# Patient Record
Sex: Male | Born: 1955 | Hispanic: Yes | Marital: Married | Smoking: Former smoker
Health system: Southern US, Community
[De-identification: ages and names within clinical notes are randomized; demographics above are authoritative.]

## PROBLEM LIST (undated history)

## (undated) DIAGNOSIS — Z87442 Personal history of urinary calculi: Secondary | ICD-10-CM

## (undated) DIAGNOSIS — I10 Essential (primary) hypertension: Secondary | ICD-10-CM

## (undated) DIAGNOSIS — G8929 Other chronic pain: Secondary | ICD-10-CM

## (undated) DIAGNOSIS — I714 Abdominal aortic aneurysm, without rupture, unspecified: Secondary | ICD-10-CM

## (undated) DIAGNOSIS — N179 Acute kidney failure, unspecified: Secondary | ICD-10-CM

## (undated) DIAGNOSIS — E119 Type 2 diabetes mellitus without complications: Secondary | ICD-10-CM

## (undated) DIAGNOSIS — I471 Supraventricular tachycardia: Secondary | ICD-10-CM

## (undated) HISTORY — PX: ABDOMINAL SURGERY: SHX537

## (undated) HISTORY — DX: Abdominal aortic aneurysm, without rupture, unspecified: I71.40

## (undated) HISTORY — PX: SHOULDER SURGERY: SHX246

---

## 2002-10-09 ENCOUNTER — Emergency Department (HOSPITAL_COMMUNITY): Admission: EM | Admit: 2002-10-09 | Discharge: 2002-10-10 | Payer: Self-pay | Admitting: Emergency Medicine

## 2002-10-09 ENCOUNTER — Encounter: Payer: Self-pay | Admitting: Emergency Medicine

## 2003-06-20 ENCOUNTER — Encounter: Payer: Self-pay | Admitting: Family Medicine

## 2003-06-20 ENCOUNTER — Ambulatory Visit (HOSPITAL_COMMUNITY): Admission: RE | Admit: 2003-06-20 | Discharge: 2003-06-20 | Payer: Self-pay | Admitting: Family Medicine

## 2003-08-05 ENCOUNTER — Ambulatory Visit (HOSPITAL_COMMUNITY): Admission: RE | Admit: 2003-08-05 | Discharge: 2003-08-05 | Payer: Self-pay | Admitting: Orthopaedic Surgery

## 2003-08-27 ENCOUNTER — Emergency Department (HOSPITAL_COMMUNITY): Admission: EM | Admit: 2003-08-27 | Discharge: 2003-08-27 | Payer: Self-pay | Admitting: Internal Medicine

## 2003-09-05 ENCOUNTER — Emergency Department (HOSPITAL_COMMUNITY): Admission: EM | Admit: 2003-09-05 | Discharge: 2003-09-05 | Payer: Self-pay | Admitting: Emergency Medicine

## 2017-12-14 ENCOUNTER — Emergency Department (HOSPITAL_COMMUNITY): Payer: Self-pay

## 2017-12-14 ENCOUNTER — Inpatient Hospital Stay (HOSPITAL_COMMUNITY)
Admission: EM | Admit: 2017-12-14 | Discharge: 2017-12-15 | DRG: 309 | Disposition: A | Discharge status: Home / Self Care | Payer: Self-pay | Attending: Family Medicine | Admitting: Family Medicine

## 2017-12-14 ENCOUNTER — Other Ambulatory Visit: Payer: Self-pay

## 2017-12-14 ENCOUNTER — Encounter (HOSPITAL_COMMUNITY): Payer: Self-pay | Admitting: *Deleted

## 2017-12-14 DIAGNOSIS — I513 Intracardiac thrombosis, not elsewhere classified: Secondary | ICD-10-CM | POA: Diagnosis present

## 2017-12-14 DIAGNOSIS — I471 Supraventricular tachycardia, unspecified: Secondary | ICD-10-CM

## 2017-12-14 DIAGNOSIS — I10 Essential (primary) hypertension: Secondary | ICD-10-CM

## 2017-12-14 DIAGNOSIS — N200 Calculus of kidney: Secondary | ICD-10-CM | POA: Diagnosis present

## 2017-12-14 DIAGNOSIS — I714 Abdominal aortic aneurysm, without rupture, unspecified: Secondary | ICD-10-CM | POA: Diagnosis present

## 2017-12-14 DIAGNOSIS — I214 Non-ST elevation (NSTEMI) myocardial infarction: Secondary | ICD-10-CM

## 2017-12-14 DIAGNOSIS — N179 Acute kidney failure, unspecified: Secondary | ICD-10-CM

## 2017-12-14 DIAGNOSIS — R935 Abnormal findings on diagnostic imaging of other abdominal regions, including retroperitoneum: Secondary | ICD-10-CM

## 2017-12-14 DIAGNOSIS — E876 Hypokalemia: Secondary | ICD-10-CM | POA: Diagnosis present

## 2017-12-14 DIAGNOSIS — R748 Abnormal levels of other serum enzymes: Secondary | ICD-10-CM

## 2017-12-14 DIAGNOSIS — I248 Other forms of acute ischemic heart disease: Secondary | ICD-10-CM | POA: Diagnosis present

## 2017-12-14 DIAGNOSIS — R109 Unspecified abdominal pain: Secondary | ICD-10-CM

## 2017-12-14 DIAGNOSIS — R7989 Other specified abnormal findings of blood chemistry: Secondary | ICD-10-CM

## 2017-12-14 DIAGNOSIS — R778 Other specified abnormalities of plasma proteins: Secondary | ICD-10-CM

## 2017-12-14 HISTORY — DX: Essential (primary) hypertension: I10

## 2017-12-14 HISTORY — DX: Acute kidney failure, unspecified: N17.9

## 2017-12-14 HISTORY — DX: Supraventricular tachycardia: I47.1

## 2017-12-14 HISTORY — DX: Supraventricular tachycardia, unspecified: I47.10

## 2017-12-14 LAB — I-STAT CHEM 8, ED
BUN: 26 mg/dL — ABNORMAL HIGH (ref 6–20)
CALCIUM ION: 1.18 mmol/L (ref 1.15–1.40)
CHLORIDE: 108 mmol/L (ref 101–111)
Creatinine, Ser: 2 mg/dL — ABNORMAL HIGH (ref 0.61–1.24)
GLUCOSE: 163 mg/dL — AB (ref 65–99)
HCT: 50 % (ref 39.0–52.0)
Hemoglobin: 17 g/dL (ref 13.0–17.0)
Potassium: 3.4 mmol/L — ABNORMAL LOW (ref 3.5–5.1)
Sodium: 145 mmol/L (ref 135–145)
TCO2: 21 mmol/L — AB (ref 22–32)

## 2017-12-14 LAB — CBC WITH DIFFERENTIAL/PLATELET
Basophils Absolute: 0 10*3/uL (ref 0.0–0.1)
Basophils Relative: 0 %
EOS PCT: 0 %
Eosinophils Absolute: 0 10*3/uL (ref 0.0–0.7)
HCT: 50.6 % (ref 39.0–52.0)
Hemoglobin: 17.3 g/dL — ABNORMAL HIGH (ref 13.0–17.0)
LYMPHS ABS: 2 10*3/uL (ref 0.7–4.0)
LYMPHS PCT: 16 %
MCH: 32.6 pg (ref 26.0–34.0)
MCHC: 34.2 g/dL (ref 30.0–36.0)
MCV: 95.3 fL (ref 78.0–100.0)
MONO ABS: 1.1 10*3/uL — AB (ref 0.1–1.0)
MONOS PCT: 8 %
Neutro Abs: 9.8 10*3/uL — ABNORMAL HIGH (ref 1.7–7.7)
Neutrophils Relative %: 76 %
PLATELETS: 268 10*3/uL (ref 150–400)
RBC: 5.31 MIL/uL (ref 4.22–5.81)
RDW: 14.4 % (ref 11.5–15.5)
WBC: 12.9 10*3/uL — AB (ref 4.0–10.5)

## 2017-12-14 LAB — I-STAT TROPONIN, ED: TROPONIN I, POC: 0.13 ng/mL — AB (ref 0.00–0.08)

## 2017-12-14 LAB — SODIUM, URINE, RANDOM: Sodium, Ur: 155 mmol/L

## 2017-12-14 LAB — TROPONIN I
Troponin I: 0.47 ng/mL (ref <0.03)
Troponin I: 1.68 ng/mL (ref <0.03)

## 2017-12-14 LAB — CREATININE, URINE, RANDOM: Creatinine, Urine: 187.83 mg/dL

## 2017-12-14 MED ORDER — DOCUSATE SODIUM 100 MG PO CAPS
100.0000 mg | ORAL_CAPSULE | Freq: Two times a day (BID) | ORAL | Status: DC
  Administered 2017-12-14 – 2017-12-15 (×2): 100 mg via ORAL
  Filled 2017-12-14 (×2): qty 1

## 2017-12-14 MED ORDER — ONDANSETRON HCL 4 MG/2ML IJ SOLN: 4.0000 mg | Freq: Four times a day (QID) | INTRAMUSCULAR | Status: DC | PRN

## 2017-12-14 MED ORDER — ADENOSINE 6 MG/2ML IV SOLN
INTRAVENOUS | Status: AC
  Filled 2017-12-14: qty 6

## 2017-12-14 MED ORDER — ACETAMINOPHEN 650 MG RE SUPP: 650.0000 mg | Freq: Four times a day (QID) | RECTAL | Status: DC | PRN

## 2017-12-14 MED ORDER — ACETAMINOPHEN 325 MG PO TABS: 650.0000 mg | ORAL_TABLET | Freq: Four times a day (QID) | ORAL | Status: DC | PRN

## 2017-12-14 MED ORDER — METOPROLOL TARTRATE 25 MG PO TABS
25.0000 mg | ORAL_TABLET | Freq: Two times a day (BID) | ORAL | Status: DC
  Administered 2017-12-14: 25 mg via ORAL
  Filled 2017-12-14: qty 1

## 2017-12-14 MED ORDER — ASPIRIN 325 MG PO TABS
325.0000 mg | ORAL_TABLET | Freq: Once | ORAL | Status: AC
  Administered 2017-12-14: 325 mg via ORAL
  Filled 2017-12-14: qty 1

## 2017-12-14 MED ORDER — SENNA 8.6 MG PO TABS
1.0000 | ORAL_TABLET | Freq: Two times a day (BID) | ORAL | Status: DC
  Administered 2017-12-14 – 2017-12-15 (×2): 8.6 mg via ORAL
  Filled 2017-12-14 (×2): qty 1

## 2017-12-14 MED ORDER — MORPHINE SULFATE (PF) 2 MG/ML IV SOLN
2.0000 mg | Freq: Once | INTRAVENOUS | Status: AC
  Administered 2017-12-14: 2 mg via INTRAVENOUS
  Filled 2017-12-14: qty 1

## 2017-12-14 MED ORDER — ENOXAPARIN SODIUM 40 MG/0.4ML ~~LOC~~ SOLN
40.0000 mg | SUBCUTANEOUS | Status: DC
  Administered 2017-12-14: 40 mg via SUBCUTANEOUS
  Filled 2017-12-14: qty 0.4

## 2017-12-14 MED ORDER — ADENOSINE 6 MG/2ML IV SOLN
INTRAVENOUS | Status: AC | PRN
  Administered 2017-12-14: 12 mg via INTRAVENOUS
  Administered 2017-12-14: 6 mg via INTRAVENOUS

## 2017-12-14 MED ORDER — ONDANSETRON HCL 4 MG PO TABS: 4.0000 mg | ORAL_TABLET | Freq: Four times a day (QID) | ORAL | Status: DC | PRN

## 2017-12-14 MED ORDER — POLYETHYLENE GLYCOL 3350 17 G PO PACK: 17.0000 g | PACK | Freq: Every day | ORAL | Status: DC | PRN

## 2017-12-14 NOTE — Progress Notes (Signed)
CRITICAL VALUE ALERT  Critical Value:  Trop 0.47  Date & Time Notied:  12/14/2017 1745  Provider Notified: Clearnce SorrelPurohit, MD  Orders Received/Actions taken: no actions received at this time

## 2017-12-14 NOTE — ED Provider Notes (Signed)
[facility] EMERGENCY DEPARTMENT Provider Note   CSN: [id number] Arrival date & time: 12/14/17  1343     History   Chief Complaint Chief Complaint  Patient presents with  . Chest Pain    HPI Jason Sweeney is a 62 y.o. male.  Patient states that he started with some chest pain around 630 this morning and it got more severe around 130.  The history is provided by the patient. No language interpreter was used.  Chest Pain   This is a new problem. The current episode started 3 to 5 hours ago. The problem occurs constantly. The problem has been rapidly worsening. The pain is associated with exertion. The pain is present in the substernal region. The pain is at a severity of 7/10. The pain is moderate. The quality of the pain is described as dull. The pain does not radiate. The symptoms are aggravated by exertion. Pertinent negatives include no abdominal pain, no back pain, no cough and no headaches.  Pertinent negatives for past medical history include no seizures.    No past medical history on file.  There are no active problems to display for this patient.         EKG Interpretation  Date/Time:    Ventricular Rate:    PR Interval:    QRS Duration:   QT Interval:    QTC Calculation:   R Axis:     Text Interpretation:           Home Medications    Prior to Admission medications   Medication Sig Start Date End Date Taking? Authorizing Provider  ibuprofen (ADVIL,MOTRIN) 200 MG tablet Take 400 mg by mouth every 6 (six) hours as needed.   Yes [provider]  lisinopril-hydrochlorothiazide (PRINZIDE,ZESTORETIC) 20-25 MG tablet Take 1 tablet by mouth daily. 12/04/17  Yes [provider]    Family History No family history on file.  Social History Social History   Tobacco Use  . Smoking status: Never Smoker  . Smokeless tobacco: Never Used  Substance Use Topics  . Alcohol use: Yes    Comment: occasionally  . Drug use: Not Currently      Allergies   Patient has no known allergies.   Review of Systems Review of Systems  Constitutional: Negative for appetite change and fatigue.  HENT: Negative for congestion, ear discharge and sinus pressure.   Eyes: Negative for discharge.  Respiratory: Negative for cough.   Cardiovascular: Positive for chest pain.  Gastrointestinal: Negative for abdominal pain and diarrhea.  Genitourinary: Negative for frequency and hematuria.  Musculoskeletal: Negative for back pain.  Skin: Negative for rash.  Neurological: Negative for seizures and headaches.  Psychiatric/Behavioral: Negative for hallucinations.     Physical Exam Updated Vital Signs BP 132/87 (BP Location: Right Arm)   Pulse 89   Temp 98.1 F (36.7 C) (Oral)   Resp 13   Ht 5\' 5"  (1.651 m)   Wt 99.8 kg (220 lb)   SpO2 99%   BMI 36.61 kg/m   Physical Exam  Constitutional: He is oriented to person, place, and time. He appears well-developed.  HENT:  Head: Normocephalic.  Eyes: Conjunctivae and EOM are normal. No scleral icterus.  Neck: Neck supple. No thyromegaly present.  Cardiovascular: Exam reveals no gallop and no friction rub.  No murmur heard. Rapid regular rate at about 180  Pulmonary/Chest: No stridor. He has no wheezes. He has no rales. He exhibits no tenderness.  Abdominal: He exhibits no distension. There is  no tenderness. There is no rebound.  Musculoskeletal: Normal range of motion. He exhibits no edema.  Lymphadenopathy:    He has no cervical adenopathy.  Neurological: He is oriented to person, place, and time. He exhibits normal muscle tone. Coordination normal.  Skin: No rash noted. No erythema.  Psychiatric: He has a normal mood and affect. His behavior is normal.     ED Treatments / Results  Labs (all labs ordered are listed, but only abnormal results are displayed) Labs Reviewed  CBC WITH DIFFERENTIAL/PLATELET - Abnormal; Notable for the following components:      Result Value   WBC  12.9 (*)    Hemoglobin 17.3 (*)    Neutro Abs 9.8 (*)    Monocytes Absolute 1.1 (*)    All other components within normal limits  I-STAT CHEM 8, ED - Abnormal; Notable for the following components:   Potassium 3.4 (*)    BUN 26 (*)    Creatinine, Ser 2.00 (*)    Glucose, Bld 163 (*)    TCO2 21 (*)    All other components within normal limits  I-STAT TROPONIN, ED - Abnormal; Notable for the following components:   Troponin i, poc 0.13 (*)    All other components within normal limits    EKG None  Radiology Dg Chest Portable 1 View  Result Date: 12/14/2017 CLINICAL DATA:  62 year old male with chest pain, back pain, dizziness, diaphoresis and chills. EXAM: PORTABLE CHEST 1 VIEW COMPARISON:  Report of chest radiograph 10/09/2002 (no images available). FINDINGS: Portable AP upright view at 1432 hours. Pacer or resuscitation pads projects over the left chest. Mediastinal contours are within normal limits. Visualized tracheal air column is within normal limits. No pneumothorax, pulmonary edema, pleural effusion or confluent pulmonary opacity. Paucity of bowel gas in the upper abdomen. Degenerative changes at the right [facility] joint. IMPRESSION: Negative.  No acute cardiopulmonary abnormality. Electronically Signed   By: Odessa Fleming M.D.   On: 12/14/2017 14:42    Procedures Procedures (including critical care time)  Medications Ordered in ED Medications  adenosine (ADENOCARD) 6 MG/2ML injection (12 mg Intravenous Given 12/14/17 1401)  morphine 2 MG/ML injection 2 mg (2 mg Intravenous Given 12/14/17 1413)  aspirin tablet 325 mg (325 mg Oral Given 12/14/17 1505)  CRITICAL CARE Performed by: Bethann Berkshire Total critical care time: 40 minutes Critical care time was exclusive of separately billable procedures and treating other patients. Critical care was necessary to treat or prevent imminent or life-threatening deterioration. Critical care was time spent personally by me on the following activities:  development of treatment plan with patient and/or surrogate as well as nursing, discussions with consultants, evaluation of patient's response to treatment, examination of patient, obtaining history from patient or surrogate, ordering and performing treatments and interventions, ordering and review of laboratory studies, ordering and review of radiographic studies, pulse oximetry and re-evaluation of patient's condition.  Patient was in a SVT at a rate of 180.  He was initially given adenosine 6 mg and did not convert.  He was then given adenosine 12 mg and did convert into normal sinus rhythm.  Patient had a elevated troponin 0.13.  Cardiology was consulted and felt the patient could be treated here at [facility].  He will be admitted to medicine with cardiology following  Initial Impression / Assessment and Plan / ED Course  I have reviewed the triage vital signs and the nursing notes.  Pertinent labs & imaging results that were available during  my care of the patient were reviewed by me and considered in my medical decision making (see chart for details).       Final Clinical Impressions(s) / ED Diagnoses   Final diagnoses:  NSTEMI (non-ST elevated myocardial infarction) [facility](HCC)    ED Discharge Orders    None       Bethann BerkshireZammit, Brayson Livesey, MD 12/14/17 1529

## 2017-12-14 NOTE — Consult Note (Signed)
Cardiology Consultation:   Patient ID: Jason Sweeney; [id number]; 11/05/1955   Admit date: 12/14/2017 Date of Consult: 12/14/2017  Primary Care Provider: Patient, No Pcp Per Primary Cardiologist: New (Dr. Purvis Sheffield)    Patient Profile:   Jason Sweeney is a 62 y.o. male with a hx of HTN who is being seen today for the evaluation of SVT and troponin elevation at the request of Dr. Estell Harpin.  History of Present Illness:   Jason Sweeney is a 62 year old male with a past medical history of hypertension.  He began experiencing pain in the throat and lower neck region this morning at about 630.  He experienced rapid palpitations.  He said this typically occurs about 3 times per year but after several minutes it stops on its own.  This morning it did not. He denies exertional chest pain and exertional dyspnea.  He denies having experienced chest pain this morning.  He also denies shortness of breath.  He did experience dizziness from 630 onwards.  He also felt diaphoretic.  He denies syncope.  He also denies leg swelling, orthopnea, paroxysmal nocturnal dyspnea.  He also describes chronic bilateral flank pain which has been going on for at least 3 years.  He describes some right lower quadrant abdominal pain as well.  He is present with his son, [place].  Asked him if he was ever evaluated by a cardiologist in [place] and he said he was but nothing was ever found.  At the time of ED presentation, he was found to be in SVT.  I personally reviewed the ECG which demonstrated SVT at a rate of 182 bpm.  He was given 6 mg and subsequently 12 mg of IV adenosine with conversion to sinus rhythm.  I reviewed the follow-up ECG which demonstrated sinus tachycardia, 107 bpm, and anteroseptal Q waves.  Labs notable for mildly low potassium of 3.4, elevated point-of-care troponin of 0.13, BUN 26, creatinine 2.  Chest x-ray showed no acute cardiopulmonary abnormalities.  He is currently symptomatically  stable.   Past Medical History:  Diagnosis Date  . AKI (acute kidney injury) (HCC) 12/14/2017  . HTN (hypertension) 12/14/2017  . SVT (supraventricular tachycardia) (HCC) 12/14/2017    Past Surgical History:  Procedure Laterality Date  . ABDOMINAL SURGERY    . SHOULDER SURGERY Left   . SHOULDER SURGERY         Inpatient Medications: Scheduled Meds:  Continuous Infusions:  PRN Meds:   Allergies:   No Known Allergies  Social History:   Social History   Socioeconomic History  . Marital status: Married    Spouse name: Not on file  . Number of children: Not on file  . Years of education: Not on file  . Highest education level: Not on file  Occupational History  . Not on file  Social Needs  . Financial resource strain: Not on file  . Food insecurity:    Worry: Not on file    Inability: Not on file  . Transportation needs:    Medical: Not on file    Non-medical: Not on file  Tobacco Use  . Smoking status: Never Smoker  . Smokeless tobacco: Never Used  Substance and Sexual Activity  . Alcohol use: Yes    Comment: occasionally  . Drug use: Not Currently  . Sexual activity: Not on file  Lifestyle  . Physical activity:    Days per week: Not on file    Minutes per session: Not on file  . Stress: Not on  file  Relationships  . Social connections:    Talks on phone: Not on file    Gets together: Not on file    Attends religious service: Not on file    Active member of club or organization: Not on file    Attends meetings of clubs or organizations: Not on file    Relationship status: Not on file  . Intimate partner violence:    Fear of current or ex partner: Not on file    Emotionally abused: Not on file    Physically abused: Not on file    Forced sexual activity: Not on file  Other Topics Concern  . Not on file  Social History Narrative  . Not on file    Family History:   No history of premature coronary disease  ROS:  Please see the history of present  illness.   All other ROS reviewed and negative.     Physical Exam/Data:   Vitals:   12/14/17 1500 12/14/17 1505 12/14/17 1530 12/14/17 1600  BP: 122/90 132/87 (!) 139/50 (!) 155/74  Pulse: 85 89 85 84  Resp: 12  12 15   Temp:  98.1 F (36.7 C)    TempSrc:  Oral    SpO2: 99% 99% 100% 100%  Weight:      Height:       No intake or output data in the 24 hours ending 12/14/17 1628 Filed Weights   12/14/17 1355  Weight: 220 lb (99.8 kg)   Body mass index is 36.61 kg/m.  General:  Well nourished, well developed, in no acute distress HEENT: normal Lymph: no adenopathy Neck: no JVD Endocrine:  No thryomegaly Vascular: No carotid bruits Cardiac:  normal S1, S2; RRR; no murmur  Lungs: Faint bibasilar crackles Abd: soft, nontender, no hepatomegaly  Ext: no edema Musculoskeletal:  No deformities, BUE and BLE strength normal and equal Skin: warm and dry  Neuro:  CNs 2-12 intact, no focal abnormalities noted Psych:  Normal affect    Telemetry:  Telemetry was personally reviewed and demonstrates: Sinus rhythm  Relevant CV Studies: Echocardiogram ordered and is pending for tomorrow  Laboratory Data:  Chemistry Recent Labs  Lab 12/14/17 1410  NA 145  K 3.4*  CL 108  GLUCOSE 163*  BUN 26*  CREATININE 2.00*    No results for input(s): PROT, ALBUMIN, AST, ALT, ALKPHOS, BILITOT in the last 168 hours. Hematology Recent Labs  Lab 12/14/17 1407 12/14/17 1410  WBC 12.9*  --   RBC 5.31  --   HGB 17.3* 17.0  HCT 50.6 50.0  MCV 95.3  --   MCH 32.6  --   MCHC 34.2  --   RDW 14.4  --   PLT 268  --    Cardiac EnzymesNo results for input(s): TROPONINI in the last 168 hours.  Recent Labs  Lab 12/14/17 1408  TROPIPOC 0.13*    BNPNo results for input(s): BNP, PROBNP in the last 168 hours.  DDimer No results for input(s): DDIMER in the last 168 hours.  Radiology/Studies:  Dg Chest Portable 1 View  Result Date: 12/14/2017 CLINICAL DATA:  62 year old male with chest  pain, back pain, dizziness, diaphoresis and chills. EXAM: PORTABLE CHEST 1 VIEW COMPARISON:  Report of chest radiograph 10/09/2002 (no images available). FINDINGS: Portable AP upright view at 1432 hours. Pacer or resuscitation pads projects over the left chest. Mediastinal contours are within normal limits. Visualized tracheal air column is within normal limits. No pneumothorax, pulmonary edema, pleural effusion or  confluent pulmonary opacity. Paucity of bowel gas in the upper abdomen. Degenerative changes at the right [facility] joint. IMPRESSION: Negative.  No acute cardiopulmonary abnormality. Electronically Signed   By: Odessa Fleming M.D.   On: 12/14/2017 14:42    Assessment and Plan:   1. SVT: He has been maintaining sinus rhythm since chemical cardioversion with IV adenosine.  Potassium is mildly low at 3.4.  Hydrochlorothiazide is on hold.  I recommend keeping potassium greater than 4 to increase arrhythmic threshold.  I will start metoprolol tartrate 25 mg twice daily.  He is also hypertensive.  2. Hypertension: Blood pressure is elevated and outpatient medications are on hold due to acute renal insufficiency (lisinopril and hydrochlorothiazide).  I am starting metoprolol tartrate to suppress SVT.  3.  Troponin elevation: Mild point of care troponin elevation is nonspecific in the setting of SVT.  He denies exertional chest pain and dyspnea.  An echocardiogram has been ordered and is pending and will be completed tomorrow.  4.  Acute renal insufficiency: He follows with the health department.  I do not know what his baseline renal function is.  Lisinopril and hydrochlorothiazide are currently being held.  He also takes ibuprofen as needed which should also be held.  He complains of bilateral flank pain for at least 3 years.  I agree with renal ultrasonography.   For questions or updates, please contact [facility] Please consult [email or web address] for contact info under Cardiology/STEMI.   Signed, Prentice Docker, MD  12/14/2017 4:28 PM

## 2017-12-14 NOTE — ED Notes (Signed)
HR down to 99.

## 2017-12-14 NOTE — ED Triage Notes (Signed)
Pt c/o chest pain that started this am; pt states the pain radiates to his neck and bilateral chest area and around middle back

## 2017-12-14 NOTE — ED Notes (Signed)
6 mg adenosine given per Dr. Estell HarpinZammit.

## 2017-12-14 NOTE — H&P (Signed)
History and Physical    Jason Sweeney GNF:[id number] DOB: Dec 06, 1955 DOA: 12/14/2017  PCP: Patient, No Pcp Per   Patient coming from: Home   Chief Complaint: Chest pain/palpitations  HPI: Jason Sweeney is a 62 y.o. male with medical history significant of hypertension, "enlarged blood vessel and stomach "diagnosed in [place] coming in with chest pain and palpitations.  Patient reports he awoke this morning with a feeling of pounding in his chest that radiated up to his neck and jaw.  He also felt quite flushed, nauseous, and extremely anxious.  He denied any shortness of breath.  He reports this pain was pounding but not sharp or dull.  He reported the pain was constant and he felt presyncopal.  He has had approximately 3 years of intermittent sharp chest pain that is not related to exertion as he works outside as a Administrator.  He otherwise denies any orthopnea, lower extremity edema, PND, syncope/presyncope, nausea, vomiting, diarrhea, cough, congestion, rash.  Of note patient was recently in [place] waiting it visiting his mother and was hospitalized for 3 days with abdominal pain and was told that he had a "large blood vessel in his stomach" and they offered to operate for which she refused.  ED Course: In the ED patient was noted to be in SVT.  He was given a 6 mg of adenosine without much improvement.  He converted then to 12 mg of adenosine.  His other vitals are unremarkable.  His labs are notable for mild potassium that was low at 3.4.  A creatinine that was 2.0 with a BUN of 26 and no comparison in the system.  His troponin was 0.13.  CBC showed a mildly elevated white count of 12.9.  Cardiology was called and recommended further evaluation.  Review of Systems: As per HPI otherwise 10 point review of systems negative.    Past Medical History:  Diagnosis Date  . AKI (acute kidney injury) (HCC) 12/14/2017  . HTN (hypertension) 12/14/2017  . SVT (supraventricular tachycardia) (HCC) 12/14/2017      Past Surgical History:  Procedure Laterality Date  . ABDOMINAL SURGERY    . SHOULDER SURGERY Left      reports that he has never smoked. He has never used smokeless tobacco. He reports that he drinks alcohol. He reports that he has current or past drug history.  No Known Allergies  History reviewed. No pertinent family history.   Prior to Admission medications   Medication Sig Start Date End Date Taking? Authorizing Provider  ibuprofen (ADVIL,MOTRIN) 200 MG tablet Take 400 mg by mouth every 6 (six) hours as needed.   Yes [provider]  lisinopril-hydrochlorothiazide (PRINZIDE,ZESTORETIC) 20-25 MG tablet Take 1 tablet by mouth daily. 12/04/17  Yes [provider]    Physical Exam: Vitals:   12/14/17 1404 12/14/17 1405 12/14/17 1430 12/14/17 1505  BP:   118/79 132/87  Pulse: 93 93 88 89  Resp: 20 19 13    Temp:    98.1 F (36.7 C)  TempSrc:    Oral  SpO2: 99% 99% 100% 99%  Weight:      Height:        Constitutional: NAD, calm, comfortable Vitals:   12/14/17 1404 12/14/17 1405 12/14/17 1430 12/14/17 1505  BP:   118/79 132/87  Pulse: 93 93 88 89  Resp: 20 19 13    Temp:    98.1 F (36.7 C)  TempSrc:    Oral  SpO2: 99% 99% 100% 99%  Weight:  Height:       Eyes: Anicteric sclera ENMT: Moist mucous membranes good dentition Neck: normal, supple Respiratory: clear to auscultation bilaterally, no wheezing, no crackles. Normal respiratory effort. No accessory muscle use.  Cardiovascular: Regular rate and rhythm, no murmurs Abdomen: no tenderness, no masses palpated. No hepatosplenomegaly. Bowel sounds positive.  Musculoskeletal: No lower extremity edema, good muscle tone Skin: no rashes visible skin Neurologic: Grossly intact, moving all extremities Psychiatric: Normal judgment and insight. Alert and oriented x 3. Normal mood.   (Anything < 9 systems with 2 bullets each down codes to level 1) (If patient refuses exam can't bill higher  level) (Make sure to document decubitus ulcers present on admission -- if possible -- and whether patient has chronic indwelling catheter at time of admission)  Labs on Admission: I have personally reviewed following labs and imaging studies  CBC: Recent Labs  Lab 12/14/17 1407 12/14/17 1410  WBC 12.9*  --   NEUTROABS 9.8*  --   HGB 17.3* 17.0  HCT 50.6 50.0  MCV 95.3  --   PLT 268  --    Basic Metabolic Panel: Recent Labs  Lab 12/14/17 1410  NA 145  K 3.4*  CL 108  GLUCOSE 163*  BUN 26*  CREATININE 2.00*   GFR: Estimated Creatinine Clearance: 41.6 mL/min (A) (by C-G formula based on SCr of 2 mg/dL (H)). Liver Function Tests: No results for input(s): AST, ALT, ALKPHOS, BILITOT, PROT, ALBUMIN in the last 168 hours. No results for input(s): LIPASE, AMYLASE in the last 168 hours. No results for input(s): AMMONIA in the last 168 hours. Coagulation Profile: No results for input(s): INR, PROTIME in the last 168 hours. Cardiac Enzymes: No results for input(s): CKTOTAL, CKMB, CKMBINDEX, TROPONINI in the last 168 hours. BNP (last 3 results) No results for input(s): PROBNP in the last 8760 hours. HbA1C: No results for input(s): HGBA1C in the last 72 hours. CBG: No results for input(s): GLUCAP in the last 168 hours. Lipid Profile: No results for input(s): CHOL, HDL, LDLCALC, TRIG, CHOLHDL, LDLDIRECT in the last 72 hours. Thyroid Function Tests: No results for input(s): TSH, T4TOTAL, FREET4, T3FREE, THYROIDAB in the last 72 hours. Anemia Panel: No results for input(s): VITAMINB12, FOLATE, FERRITIN, TIBC, IRON, RETICCTPCT in the last 72 hours. Urine analysis: No results found for: COLORURINE, APPEARANCEUR, LABSPEC, PHURINE, GLUCOSEU, HGBUR, BILIRUBINUR, KETONESUR, PROTEINUR, UROBILINOGEN, NITRITE, LEUKOCYTESUR  Radiological Exams on Admission: Dg Chest Portable 1 View  Result Date: 12/14/2017 CLINICAL DATA:  62 year old male with chest pain, back pain, dizziness,  diaphoresis and chills. EXAM: PORTABLE CHEST 1 VIEW COMPARISON:  Report of chest radiograph 10/09/2002 (no images available). FINDINGS: Portable AP upright view at 1432 hours. Pacer or resuscitation pads projects over the left chest. Mediastinal contours are within normal limits. Visualized tracheal air column is within normal limits. No pneumothorax, pulmonary edema, pleural effusion or confluent pulmonary opacity. Paucity of bowel gas in the upper abdomen. Degenerative [facility]. IMPRESSION: Negative.  No acute cardiopulmonary abnormality. Electronically Signed   By: Odessa FlemingH  Hall M.D.   On: 12/14/2017 14:42    EKG: Independently reviewed.  Sinus rhythm, LVH, possible 1 mm depressions in V4 and V5, early repolarization, poor R wave progression  Assessment/Plan Principal Problem:   SVT (supraventricular tachycardia) (HCC) Active Problems:   AKI (acute kidney injury) (HCC)   HTN (hypertension)   Elevated troponin    #) SVT: Unclear triggering factor however suspect patient might have some underlying cardiac disease that is  the most likely cause likely related to his poorly treated hypertension. -Telemetry -Cardiology consult in the morning -Echo  #) Elevated troponin: Likely in the setting of demand.  Patient has atypical chest pain for the past 3 years however he is at relatively high risk.  He might benefit from ischemic evaluation while he is here. -Cycle troponins -We will hold on heparin at this time -Echo pending, cardiology consult -Lipid panel and hemoglobin A1c ordered  #) Acute kidney injury: Unclear what patient's baseline creatinine is in the system.  His BUN is only minimally elevated where his creatinine is 2.0.  Suspect is a component of acute kidney injury superimposed on chronic kidney disease. -Renal ultrasound ordered -Urine sodium and creatinine ordered -Hold lisinopril  #) Hypertension: Patient is currently normotensive -Hold lisinopril 20 mg  daily -Hold HCTZ 25 mg daily  #) Enlarged pain: Patient reports being told he had an enlarged vein in his stomach in [place] when he was visiting his mother. -Abdominal ultrasound for AAA screening ordered  Fluids: Tolerating p.o. Electrolytes: Monitor and supplement Nutrition: Heart healthy diet  Prophylaxis: Enoxaparin  Disposition: Pending cardiology evaluation and echo  Full code  Delaine Lame MD [facility]   If 7PM-7AM, please contact night-coverage [email or web address] Password [facility]  12/14/2017, 4:02 PM

## 2017-12-15 ENCOUNTER — Inpatient Hospital Stay (HOSPITAL_COMMUNITY): Payer: Self-pay

## 2017-12-15 DIAGNOSIS — R9431 Abnormal electrocardiogram [ECG] [EKG]: Secondary | ICD-10-CM

## 2017-12-15 DIAGNOSIS — R935 Abnormal findings on diagnostic imaging of other abdominal regions, including retroperitoneum: Secondary | ICD-10-CM

## 2017-12-15 DIAGNOSIS — I714 Abdominal aortic aneurysm, without rupture, unspecified: Secondary | ICD-10-CM | POA: Diagnosis present

## 2017-12-15 LAB — ECHOCARDIOGRAM COMPLETE
Height: 65 in
Weight: 3520 [oz_av]

## 2017-12-15 LAB — LIPID PANEL
Cholesterol: 135 mg/dL (ref 0–200)
HDL: 31 mg/dL — ABNORMAL LOW (ref >40)
LDL Cholesterol: 77 mg/dL (ref 0–99)
Total CHOL/HDL Ratio: 4.4 ratio
Triglycerides: 134 mg/dL (ref <150)
VLDL: 27 mg/dL (ref 0–40)

## 2017-12-15 LAB — BASIC METABOLIC PANEL
Anion gap: 11 (ref 5–15)
BUN: 26 mg/dL — ABNORMAL HIGH (ref 6–20)
Chloride: 104 mmol/L (ref 101–111)
GFR calc Af Amer: >60 mL/min (ref >60)
GFR calc non Af Amer: >60 mL/min (ref >60)
Glucose, Bld: 109 mg/dL — ABNORMAL HIGH (ref 65–99)
Potassium: 3.3 mmol/L — ABNORMAL LOW (ref 3.5–5.1)
Sodium: 138 mmol/L (ref 135–145)

## 2017-12-15 LAB — HEPATIC FUNCTION PANEL
ALT: 26 U/L (ref 17–63)
AST: 34 U/L (ref 15–41)
Albumin: 3.6 g/dL (ref 3.5–5.0)
Alkaline Phosphatase: 35 U/L — ABNORMAL LOW (ref 38–126)
Bilirubin, Direct: 0.2 mg/dL (ref 0.1–0.5)
Indirect Bilirubin: 1 mg/dL — ABNORMAL HIGH (ref 0.3–0.9)
Total Bilirubin: 1.2 mg/dL (ref 0.3–1.2)
Total Protein: 7 g/dL (ref 6.5–8.1)

## 2017-12-15 LAB — CBC
HCT: 46.4 % (ref 39.0–52.0)
Hemoglobin: 15.7 g/dL (ref 13.0–17.0)
MCH: 32 pg (ref 26.0–34.0)
MCHC: 33.8 g/dL (ref 30.0–36.0)
MCV: 94.5 fL (ref 78.0–100.0)
Platelets: 253 K/uL (ref 150–400)
RBC: 4.91 MIL/uL (ref 4.22–5.81)
RDW: 14.4 % (ref 11.5–15.5)
WBC: 9 10*3/uL (ref 4.0–10.5)

## 2017-12-15 LAB — TSH: TSH: 0.48 u[IU]/mL (ref 0.350–4.500)

## 2017-12-15 LAB — MAGNESIUM: Magnesium: 1.9 mg/dL (ref 1.7–2.4)

## 2017-12-15 LAB — BASIC METABOLIC PANEL WITH GFR
CO2: 23 mmol/L (ref 22–32)
Calcium: 9.3 mg/dL (ref 8.9–10.3)
Creatinine, Ser: 0.89 mg/dL (ref 0.61–1.24)

## 2017-12-15 LAB — HIV ANTIBODY (ROUTINE TESTING W REFLEX): HIV Screen 4th Generation wRfx: NONREACTIVE

## 2017-12-15 LAB — TROPONIN I
TROPONIN I: 1.37 ng/mL — AB (ref <0.03)
Troponin I: 1.84 ng/mL (ref <0.03)

## 2017-12-15 LAB — HEMOGLOBIN A1C
Hgb A1c MFr Bld: 5.5 % (ref 4.8–5.6)
Mean Plasma Glucose: 111.15 mg/dL

## 2017-12-15 MED ORDER — ASPIRIN 81 MG PO TBEC: 81.0000 mg | DELAYED_RELEASE_TABLET | Freq: Every day | ORAL | Status: DC

## 2017-12-15 MED ORDER — LISINOPRIL-HYDROCHLOROTHIAZIDE 20-12.5 MG PO TABS: 1.0000 | ORAL_TABLET | Freq: Every day | ORAL | 0 refills | Status: DC

## 2017-12-15 MED ORDER — POTASSIUM CHLORIDE 10 MEQ/100ML IV SOLN
INTRAVENOUS | Status: AC
  Filled 2017-12-15: qty 100

## 2017-12-15 MED ORDER — ASPIRIN EC 81 MG PO TBEC
81.0000 mg | DELAYED_RELEASE_TABLET | Freq: Every day | ORAL | Status: DC
  Administered 2017-12-15: 81 mg via ORAL
  Filled 2017-12-15: qty 1

## 2017-12-15 MED ORDER — POTASSIUM CHLORIDE 10 MEQ/100ML IV SOLN
10.0000 meq | INTRAVENOUS | Status: AC
  Administered 2017-12-15 (×5): 10 meq via INTRAVENOUS
  Filled 2017-12-15 (×4): qty 100

## 2017-12-15 MED ORDER — DOCUSATE SODIUM 100 MG PO CAPS: 100.0000 mg | ORAL_CAPSULE | Freq: Two times a day (BID) | ORAL | 0 refills | Status: DC | PRN

## 2017-12-15 MED ORDER — METOPROLOL TARTRATE 25 MG PO TABS: 25.0000 mg | ORAL_TABLET | Freq: Two times a day (BID) | ORAL | 0 refills | Status: DC

## 2017-12-15 MED ORDER — POTASSIUM CHLORIDE CRYS ER 20 MEQ PO TBCR
20.0000 meq | EXTENDED_RELEASE_TABLET | Freq: Once | ORAL | Status: AC
  Administered 2017-12-15: 20 meq via ORAL
  Filled 2017-12-15: qty 1

## 2017-12-15 MED ORDER — METOPROLOL TARTRATE 25 MG PO TABS
25.0000 mg | ORAL_TABLET | Freq: Two times a day (BID) | ORAL | Status: DC
  Administered 2017-12-15: 25 mg via ORAL
  Filled 2017-12-15: qty 1

## 2017-12-15 NOTE — Progress Notes (Signed)
Progress Note  Patient Name: Jason Sweeney Date of Encounter: 12/15/2017  Primary Cardiologist: New Dr Purvis SheffieldKoneswaran  Subjective   No complaints    Inpatient Medications    Scheduled Meds: . docusate sodium  100 mg Oral BID  . enoxaparin (LOVENOX) injection  40 mg Subcutaneous Q24H  . metoprolol tartrate  25 mg Oral BID  . senna  1 tablet Oral BID   Continuous Infusions: . potassium chloride Stopped (12/15/17 0929)   PRN Meds: acetaminophen **OR** acetaminophen, ondansetron **OR** ondansetron (ZOFRAN) IV, polyethylene glycol   Vital Signs    Vitals:   12/14/17 1729 12/14/17 2054 12/14/17 2101 12/15/17 0601  BP: (!) 135/96  (!) 130/93 (!) 139/102  Pulse: 80  69 63  Resp:      Temp:   97.8 F (36.6 C) 97.7 F (36.5 C)  TempSrc:   Oral Oral  SpO2: 96% 96% 96% 97%  Weight:      Height:        Intake/Output Summary (Last 24 hours) at 12/15/2017 0852 Last data filed at 12/15/2017 0829 Gross per 24 hour  Intake 340 ml  Output -  Net 340 ml   Filed Weights   12/14/17 1355  Weight: 220 lb (99.8 kg)    Telemetry    SR - Personally Reviewed  ECG    na - Personally Reviewed  Physical Exam   GEN: No acute distress.   Neck: No JVD Cardiac: RRR, no murmurs, rubs, or gallops.  Respiratory: Clear to auscultation bilaterally. GI: Soft, nontender, non-distended  MS: No edema; No deformity. Neuro:  Nonfocal  Psych: Normal affect   Labs    Chemistry Recent Labs  Lab 12/14/17 1410 12/15/17 0346  NA 145 138  K 3.4* 3.3*  CL 108 104  CO2  --  23  GLUCOSE 163* 109*  BUN 26* 26*  CREATININE 2.00* 0.89  CALCIUM  --  9.3  PROT  --  7.0  ALBUMIN  --  3.6  AST  --  34  ALT  --  26  ALKPHOS  --  35*  BILITOT  --  1.2  GFRNONAA  --  >60  GFRAA  --  >60  ANIONGAP  --  11     Hematology Recent Labs  Lab 12/14/17 1407 12/14/17 1410 12/15/17 0346  WBC 12.9*  --  9.0  RBC 5.31  --  4.91  HGB 17.3* 17.0 15.7  HCT 50.6 50.0 46.4  MCV 95.3  --  94.5    MCH 32.6  --  32.0  MCHC 34.2  --  33.8  RDW 14.4  --  14.4  PLT 268  --  253    Cardiac Enzymes Recent Labs  Lab 12/14/17 1607 12/14/17 2158 12/15/17 0346  TROPONINI 0.47* 1.68* 1.84*    Recent Labs  Lab 12/14/17 1408  TROPIPOC 0.13*     BNPNo results for input(s): BNP, PROBNP in the last 168 hours.   DDimer No results for input(s): DDIMER in the last 168 hours.   Radiology    Dg Chest Portable 1 View  Result Date: 12/14/2017 CLINICAL DATA:  [id number] year old male with chest pain, back pain, dizziness, diaphoresis and chills. EXAM: PORTABLE CHEST 1 VIEW COMPARISON:  Report of chest radiograph 10/09/2002 (no images available). FINDINGS: Portable AP upright view at 1432 hours. Pacer or resuscitation pads projects over the left chest. Mediastinal contours are within normal limits. Visualized tracheal air column is within normal limits. No pneumothorax, pulmonary edema, pleural effusion  or confluent pulmonary opacity. Paucity of bowel gas in the upper abdomen. Degenerative changes at the right [facility] joint. IMPRESSION: Negative.  No acute cardiopulmonary abnormality. Electronically Signed   By: Odessa Fleming M.D.   On: 12/14/2017 14:42    Cardiac Studies     Patient Profile     Jason Sweeney is a 62 y.o. male with a hx of HTN who is being seen today for the evaluation of SVT and troponin elevation at the request of Dr. Estell Harpin.    Assessment & Plan    1. SVT - admitted with palpitations, no specific chest pain or DOE - in ER found to be in SVT rate 182, converted to 12mg  of IV adnenosine.  - started on lopressor. K 3.4, being replaed this AM. Mg 1.9. TSH pending - echo pending. - post conversion EKG without acute ischemic changes, possible anteroseptal Qwaves     2. Elevated troponin - up to 1.8 without established peak.  - post conversion EKG without acute ischemic changes, possible anteroseptal Qwaves - episodes of chest pain have always been associated with palpitations. No  exertional symptoms. Remains physically active working in landscaping, tolerates well without troubles.  - probable demand ischemia due to severe tachycardia. He has not had any specific chest pain - f/u echo results, if normal LVEF and trop peak is reasonable would plan for outpatient stress testing.     For questions or updates, please contact [facility] Please consult [email or web address] for contact info under Cardiology/STEMI.      Joanie Coddington, MD  12/15/2017, 8:52 AM

## 2017-12-15 NOTE — Progress Notes (Signed)
CRITICAL VALUE ALERT  Critical Value:  Troponin 1.84  Date & Time Notied:  12/15/17 0615  Provider Notified: Dr. Antionette Charpyd  Orders Received/Actions taken: Order to give Lopressor, will continue to monitor patient.

## 2017-12-15 NOTE — Care Management Note (Signed)
Case Management Note  Patient Details  Name: Celesta GentileJuan Schwabe MRN: [id number] Date of Birth: 16-Apr-1956  Subjective/Objective:    Admitted with SVT. Pt from home with spouse, has no insurance, unemployed. Pt ind with ADL'S. [facility].                 Action/Plan: DC home with self care. Wife at bedside. Pt given MATCH voucher. [facility]   Expected Discharge Date:     12/15/2017             Expected Discharge Plan:  Home/Self Care  In-House Referral:  NA  Discharge planning Services  [facility]  Post Acute Care Choice:  NA Choice offered to:  NA  Status of Service:  Completed, signed off  Malcolm MetroChildress, Jozlin Bently Demske, RN 12/15/2017, 12:59 PM

## 2017-12-15 NOTE — Discharge Instructions (Signed)
La aspirina y el corazn (Aspirin and Your Heart) La aspirina es un medicamento que afecta la forma en que la sangre coagula. Puede utilizarse para reducir Nurse, adultel riesgo de cogulos sanguneos, ataques cardacos y otros problemas relacionados con el corazn. DEBO TOMAR ASPIRINA? El mdico lo ayudar a Chief Strategy Officerdeterminar si es seguro y beneficioso para usted tomar una aspirina a diario. Tomar una aspirina a diario puede ser beneficioso si usted:  Tuvo un ataque cardaco o dolor de pecho.  Le realizaron una ciruga a corazn abierto, como ciruga de injerto de revascularizacin arterial coronaria (IRAC).  Le realizaron una angioplastia coronaria.  Tuvo un ictus o un ataque isqumico transitorio (AIT).  Tiene enfermedad vascular perifrica (EVP).  Tiene problemas de ritmo cardaco crnicos, como fibrilacin auricular. TOMAR ASPIRINA DIARIAMENTE, Kathie RhodesIENE ALGN RIESGO? El consumo diario de aspirina puede aumentar el riesgo de efectos secundarios. Entre ellos se incluyen los siguientes:  Hemorragia. Problemas de hemorragias, que pueden ser Liberty Globalmenores o graves. Por ejemplo, un problema menor es un corte que no deja de Geophysicist/field seismologistsangrar; uno ms grave, es Contractoruna hemorragia estomacal o cerebral. El riesgo de hemorragias aumenta si tambin toma antiinflamatorios no esteroides (AINE).  Aumento de la formacin de hematomas.  Malestar estomacal.  Reacciones alrgicas. Las personas con plipos nasales corren un riesgo mayor de tener alergia a la aspirina. QU PAUTAS DEBO SEGUIR CUANDO TOMO ASPIRINA?  Tome aspirina solamente como se lo haya indicado el mdico. Asegrese de que comprende cunto debe tomar y cul debe ser su presentacin. Las dos presentaciones de la aspirina son: ? No gastrorresistente. Este tipo de aspirina no es Angolarecubierta y se absorbe rpidamente. Generalmente, la aspirina no gastrorresistente se recomienda para las personas con dolor de Little Silverpecho. Este tipo de aspirina tambin est disponible como comprimido  masticable. ? Gastrorresistente. Este tipo de aspirina tiene un recubrimiento especial que libera el medicamento muy lentamente. La aspirina gastrorresistente causa menos malestar estomacal en comparacin con la opcin que no es Arts development officergastrorresistente. Este tipo de aspirina no debe masticarse ni triturarse.  Beba alcohol con moderacin. Beber alcohol aumenta el riesgo de Musiciantener hemorragias. CUNDO DEBO BUSCAR ATENCIN MDICA?  Tiene hemorragias o hematomas fuera de lo normal.  Siente dolor en el estmago.  Tiene una reaccin alrgica. Los sntomas de una reaccin alrgica Baxter Internationalincluyen los siguientes: ? Ronchas. ? Picazn en la piel. ? Hinchazn de los labios, la lengua o el rostro.  Tiene zumbidos de odos. CUNDO DEBO BUSCAR ASISTENCIA MDICA INMEDIATA?  La materia fecal tiene Echo Hillssangre, es de color rojo oscuro o negro.  Vomita o tose sangre.  Observa sangre en la orina.  Tose, tiene sibilancias o Company secretaryle falta el aire. Si tiene Ciscoalguno de los siguientes sntomas, se trata de Radio broadcast assistantuna emergencia. No espere a que el dolor se vaya. Obtenga ayuda mdica de inmediato. Comunquese con el servicio de emergencias de su localidad (911 en los [place]). No conduzca por sus propios medios Dollar Generalhasta el hospital.  Siente un dolor intenso en el pecho, especialmente si lo siente como una opresin o un peso y se extiende Fifth Third Bancorphacia los brazos, la espalda, el cuello o la Canaloumandbula.  Tiene sntomas similares a los del ictus, por ejemplo: ? Prdida de la visin. ? Dificultad para hablar. ? Adormecimiento o debilidad de un lado del cuerpo. ? Adormecimiento o debilidad de un brazo o una pierna. ? No piensa con claridad o est confundido. Esta informacin no tiene Theme park managercomo fin reemplazar el consejo del mdico. Asegrese de hacerle al mdico cualquier pregunta que tenga. Document Released:  11/11/2008 Document Revised: 09/05/2014 Document Reviewed: 11/20/2013 Elsevier Interactive Patient Education  [employer].

## 2017-12-15 NOTE — Progress Notes (Signed)
Notified about AAA noted by US this admission at 4.7 cm with thrombus. Patient will require outpatient referral to vascular, I don't see any indication for inpatient evaluation    Dina RichJonathan Myrel Rappleye MD

## 2017-12-15 NOTE — Progress Notes (Signed)
  Echocardiogram 2D Echocardiogram has been performed.  Dewayne Severe T Yu Peggs 12/15/2017, 2:01 PM

## 2017-12-15 NOTE — Progress Notes (Addendum)
PROGRESS NOTE    Cedrik Heindl  ZOX:[id number]  DOB: [birth year]  DOA: 12/14/2017 PCP: Patient, No Pcp Per   Brief Admission Hx: Jason Sweeney is a 62 year old male with medical history significant for hypertension, "enlarged blood vessel and stomach" that was diagnosed in [place]. He presented in the ED with chest pain and palpitations. Patient reports pounding in his chest when he awoke from sleeping in the morning. He states the pain radiated to his neck and jaw. He states flushing, nausea, and feeling anxious. He states the pain was pounding and constant. He states feeling light headed. He denies shortness of breath, orthopnea, edema, syncope, nausea, vomiting, diarrhea, cough congestion or rash. Patient has traveled recently to [place] to visit his mother. He was hospitalized for 3 days with abdominal pain and was offered surgery for "large vessel in stomach". The patient refused and returned home.   MDM/Assessment & Plan:   1 SVT - No clear trigger for cause. Patient has poorly treated hypertension. Patient admit to telemetry. Echo and consult with Cardiology. Echo is pending.   2 Elevated Troponin - Troponin elevated at 1.8. Cardiology thinks due to probable demand ischemia due to severe tachycardia. Dr. Wyline Mood plans for F/U of Echo, if normal outpatient stress testing.  3 Acute Kidney Injury - No baseline for patient's creatinine. BUN remains elevated from baseline at admission of 26. Creatinine was initially elevated at 2.00 has returned to normal limits at 0.89. Will hold lisinopril. Renal US shows calculus in the right kidney measuring 6 mm in diameter. Urine sodium was normal at 155.  4 Hypertension - Currently normal. Hold lisinopril and HCTZ.  5 Abdominal Aortic Aneurysm - Patient reports being told he had an "enlarged vein in his stomach" in [place] when he was visiting his mother. Abdominal ultrasound for AAA screening ordered. Abdominal US shows distal aortic aneurysm measuring 4.7  cm with associated mural thrombus. Consult with Dr. Wyline Mood advises outpatient follow up with Vascular surgery.   DVT prophylaxis: Lovenox Code Status: FULL Family Communication: Family at bedside during patient interview. Disposition Plan: Pending Echocardiogram results.   Consultants:  Cardiology; Dr. Wyline Mood    Subjective: Patient states he feels fine and denies current chest pain or shortness of breath.  Objective: Vitals:   12/14/17 1729 12/14/17 2054 12/14/17 2101 12/15/17 0601  BP: (!) 135/96  (!) 130/93 (!) 139/102  Pulse: 80  69 63  Resp:      Temp:   97.8 F (36.6 C) 97.7 F (36.5 C)  TempSrc:   Oral Oral  SpO2: 96% 96% 96% 97%  Weight:      Height:        Intake/Output Summary (Last 24 hours) at 12/15/2017 1233 Last data filed at 12/15/2017 1126 Gross per 24 hour  Intake 660 ml  Output -  Net 660 ml   Filed Weights   12/14/17 1355  Weight: 99.8 kg (220 lb)     REVIEW OF SYSTEMS  As per history otherwise all reviewed and reported negative  Exam:  General exam: Alert, cooperative, in NAD. Respiratory system: Clear. No increased work of breathing. Cardiovascular system: Regularly irregular. No JVD, murmurs, gallops, clicks or pedal edema. Gastrointestinal system: Abdomen is nondistended, soft and nontender. Normal bowel sounds heard. Central nervous system: Alert and oriented. No focal neurological deficits. Extremities: no CCE.  Data Reviewed: Basic Metabolic Panel: Recent Labs  Lab 12/14/17 1410 12/15/17 0346  NA 145 138  K 3.4* 3.3*  CL 108 104  CO2  --  23  GLUCOSE 163* 109*  BUN 26* 26*  CREATININE 2.00* 0.89  CALCIUM  --  9.3  MG  --  1.9   Liver Function Tests: Recent Labs  Lab 12/15/17 0346  AST 34  ALT 26  ALKPHOS 35*  BILITOT 1.2  PROT 7.0  ALBUMIN 3.6   No results for input(s): LIPASE, AMYLASE in the last 168 hours. No results for input(s): AMMONIA in the last 168 hours. CBC: Recent Labs  Lab 12/14/17 1407  12/14/17 1410 12/15/17 0346  WBC 12.9*  --  9.0  NEUTROABS 9.8*  --   --   HGB 17.3* 17.0 15.7  HCT 50.6 50.0 46.4  MCV 95.3  --  94.5  PLT 268  --  253   Cardiac Enzymes: Recent Labs  Lab 12/14/17 1607 12/14/17 2158 12/15/17 0346 12/15/17 0910  TROPONINI 0.47* 1.68* 1.84* 1.37*   CBG (last 3)  No results for input(s): GLUCAP in the last 72 hours. No results found for this or any previous visit (from the past 240 hour(s)).   Studies: [place] Renal  Result Date: 12/15/2017 CLINICAL DATA:  Acute kidney injury. Bilateral flank pain for 3 years. History of hypertension. EXAM: RENAL / URINARY TRACT ULTRASOUND COMPLETE COMPARISON:  None. FINDINGS: Right Kidney: Length: 13.1 centimeters. Mid to LOWER pole simple cyst is 1.9 centimeters. No hydronephrosis or suspicious mass. Left Kidney: Length: 14.5 centimeters. Normal echogenicity. No hydronephrosis. Midpole calculus is estimated to be 6 millimeters in diameter. No cystic or solid mass. Bladder: Bilateral ureteral jets are present. IMPRESSION: 1. No hydronephrosis. 2. RIGHT renal cyst. 3. Suspect intrarenal calculus in the midpole the RIGHT kidney, 6 millimeters in diameter. Electronically Signed   By: Norva PavlovElizabeth  Brown M.D.   On: 12/15/2017 11:07   Dg Chest Portable 1 View  Result Date: 12/14/2017 CLINICAL DATA:  [age] year old male with chest pain, back pain, dizziness, diaphoresis and chills. EXAM: PORTABLE CHEST 1 VIEW COMPARISON:  Report of chest radiograph 10/09/2002 (no images available). FINDINGS: Portable AP upright view at 1432 hours. Pacer or resuscitation pads projects over the left chest. Mediastinal contours are within normal limits. Visualized tracheal air column is within normal limits. No pneumothorax, pulmonary edema, pleural effusion or confluent pulmonary opacity. Paucity of bowel gas in the upper abdomen. Degenerative changes at the right [facility] joint. IMPRESSION: Negative.  No acute cardiopulmonary abnormality. Electronically Signed    By: Odessa FlemingH  Hall M.D.   On: 12/14/2017 14:42   [place] Abdominal Aorta Screening Aaa  Result Date: 12/15/2017 CLINICAL DATA:  Bilateral flank pain and vertigo. Pulsatile mass. History of hypertension, CAD. EXAM: ULTRASOUND OF ABDOMINAL AORTA TECHNIQUE: Ultrasound examination of the abdominal aorta was performed to evaluate for abdominal aortic aneurysm. COMPARISON:  None. FINDINGS: Abdominal aortic measurements as follows: Proximal:  3.6 cm Mid:  2.6 cm Distal:  4.7 cm Mural thrombus is identified at the level of the LOWER aorta. Proximal iliac arteries are mildly ectatic, 1.3 centimeters on the RIGHT and 1.3 centimeters on the LEFT. Turbulent flow is identified within the aneurysm. Velocity within the distal aorta is 89 -108 centimeters/second. IMPRESSION: 1. 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 2. Turbulent flow in the aneurysm with peak velocity of 89-108. Electronically Signed   By: Norva PavlovElizabeth  Brown M.D.   On: 12/15/2017 11:11     Scheduled Meds: . aspirin EC  81 mg Oral Daily  . docusate sodium  100 mg Oral BID  . enoxaparin (LOVENOX) injection  40 mg Subcutaneous Q24H  .  metoprolol tartrate  25 mg Oral BID  . senna  1 tablet Oral BID   Continuous Infusions:  Principal Problem:   SVT (supraventricular tachycardia) (HCC) Active Problems:   AKI (acute kidney injury) (HCC)   HTN (hypertension)   Elevated troponin   Time spent: 55  Earlie Lou, Student AGACNP   Attending:  I have seen and examined patient with Ronnie Derby NP student and I agree with the documentation, A&P as noted above.  Will await echo results and hopefully can discharge home later today.  Will follow.    Standley Dakins, MD, FAAFP [facility] Pager [phone]  If 7PM-7AM, please contact night-coverage [email or web address] Password [email or web address] 12/15/2017, 12:33 PM    LOS: 1 day

## 2017-12-15 NOTE — Progress Notes (Signed)
Pt discharged home today per Dr. Johnson. Pt's IV site D/C'd and WDL. Pt's VSS. Pt provided with home medication list, discharge instructions and prescriptions. Verbalized understanding. Pt left floor via WC in stable condition accompanied by NT. 

## 2017-12-15 NOTE — Discharge Summary (Signed)
Physician Discharge Summary  Wise Fees ZOX:[id number] DOB: July 09, 1956 DOA: 12/14/2017  PCP: [facility] Cardiology: Purvis Sheffield  Admit date: 12/14/2017 Discharge date: 12/15/2017  Admitted From: home  Disposition: Home  Recommendations for Outpatient Follow-up:  1. Follow up with PCP in 1 weeks 2. Follow up with vascular surgery in 2-3 weeks to evaluate AAA 3. Followup with cardiology in 2-4 weeks. 4. Please obtain BMP/CBC in one week  Discharge Condition: STABLE   CODE STATUS: FULL    Brief Hospitalization Summary: Please see all hospital notes, images, labs for full details of the hospitalization. HPI: Jason Sweeney is a 62 y.o. male with medical history significant of hypertension, "enlarged blood vessel and stomach "diagnosed in [place] coming in with chest pain and palpitations.  Patient reports he awoke this morning with a feeling of pounding in his chest that radiated up to his neck and jaw.  He also felt quite flushed, nauseous, and extremely anxious.  He denied any shortness of breath.  He reports this pain was pounding but not sharp or dull.  He reported the pain was constant and he felt presyncopal.  He has had approximately 3 years of intermittent sharp chest pain that is not related to exertion as he works outside as a Administrator.  He otherwise denies any orthopnea, lower extremity edema, PND, syncope/presyncope, nausea, vomiting, diarrhea, cough, congestion, rash.  Of note patient was recently in [place] waiting it visiting his mother and was hospitalized for 3 days with abdominal pain and was told that he had a "large blood vessel in his stomach" and they offered to operate for which she refused.  ED Course: In the ED patient was noted to be in SVT.  He was given a 6 mg of adenosine without much improvement.  He converted then to 12 mg of adenosine.  His other vitals are unremarkable.  His labs are notable for mild potassium that was low at 3.4.  A creatinine that was 2.0 with a BUN of 26  and no comparison in the system.  His troponin was 0.13.  CBC showed a mildly elevated white count of 12.9.  Cardiology was called and recommended further evaluation.  Brief Admission Hx: Jason Sweeney is a 62 year old male with medical history significant for hypertension, "enlarged blood vessel and stomach" that was diagnosed in [place]. He presented in the ED with chest pain and palpitations. Patient reports pounding in his chest when he awoke from sleeping in the morning. He states the pain radiated to his neck and jaw. He states flushing, nausea, and feeling anxious. He states the pain was pounding and constant. He states feeling light headed. He denies shortness of breath, orthopnea, edema, syncope, nausea, vomiting, diarrhea, cough congestion or rash. Patient has traveled recently to [place] to visit his mother. He was hospitalized for 3 days with abdominal pain and was offered surgery for "large vessel in stomach". The patient refused and returned home.   MDM/Assessment & Plan:   1 SVT - No clear trigger for cause. Patient has poorly treated hypertension. Patient admit to telemetry. Echo and consult with Cardiology. Echo was obtained.   Echo Results:   Study Conclusions - Left ventricle: The cavity size was normal. Wall thickness was  increased in a pattern of moderate to severe LVH. Systolic  function was normal. The estimated ejection fraction was in the  range of 60% to 65%. Wall motion was normal; there were no  regional wall motion abnormalities. Left ventricular diastolic  function parameters were normal. - Aortic  valve: Valve area (VTI): 2.64 cm^2. Valve area (Vmax):   2.51 cm^2. Valve area (Vmean): 2.43 cm^2. - Technically adequate study.  2 Elevated Troponin - Troponin elevated at 1.8. Cardiology thinks due to probable demand ischemia due to severe tachycardia. Dr. Wyline Mood plans for F/U of Echo, if normal outpatient stress testing.  Pt advised to see cardiology outpatient in 2-4 weeks.     3 Acute Kidney Injury - Resolved now.  No baseline for patient's creatinine. BUN remains elevated from baseline at admission of 26. Creatinine was initially elevated at 2.00 has returned to normal limits at 0.89. Resume home lisinopril. Renal US shows calculus in the right kidney measuring 6 mm in diameter. Urine sodium was normal at 155.  4 Hypertension - Currently normal. Resume home zestoretic but lower HCT to 12.5 mg.   5 Abdominal Aortic Aneurysm - Patient reports being told he had an "enlarged vein in his stomach" in [place] when he was visiting his mother. Abdominal ultrasound for AAA screening ordered. Abdominal US shows distal aortic aneurysm measuring 4.7 cm with associated mural thrombus. Consult with Dr. Wyline Mood advises outpatient follow up with Vascular surgery.  Pt was given information to call Dr. Edilia Bo and make an appointment.    6 Hypokalemia - given IV replacement and reduced home HCTZ to 12.5 mg.  Follow up with PCP.  Pt will [facility] care.    DVT prophylaxis: Lovenox Code Status: FULL Family Communication: Family at bedside during patient interview. Disposition Plan: Home .   Consultants:  Cardiology; Dr. Wyline Mood  Discharge Diagnoses:  Principal Problem:   SVT (supraventricular tachycardia) (HCC) Active Problems:   AKI (acute kidney injury) (HCC)   HTN (hypertension)   Elevated troponin   AAA (abdominal aortic aneurysm) without rupture (HCC)   Abnormal [place] (ultrasound) of abdomen  Discharge Instructions: Discharge Instructions    Call MD for:  difficulty breathing, headache or visual disturbances   Complete by:  As directed    Call MD for:  extreme fatigue   Complete by:  As directed    Call MD for:  persistant dizziness or light-headedness   Complete by:  As directed    Call MD for:  persistant nausea and vomiting   Complete by:  As directed    Call MD for:  severe uncontrolled pain   Complete by:  As directed    Diet - low sodium  heart healthy   Complete by:  As directed    Increase activity slowly   Complete by:  As directed      Allergies as of 12/15/2017   No Known Allergies     Medication List    STOP taking these medications   lisinopril-hydrochlorothiazide 20-25 MG tablet Commonly known as:  PRINZIDE,ZESTORETIC Replaced by:  lisinopril-hydrochlorothiazide 20-12.5 MG tablet     TAKE these medications   aspirin 81 MG EC tablet Take 1 tablet (81 mg total) by mouth daily. Start taking on:  12/16/2017   docusate sodium 100 MG capsule Commonly known as:  COLACE Take 1 capsule (100 mg total) by mouth 2 (two) times daily as needed for mild constipation.   ibuprofen 200 MG tablet Commonly known as:  ADVIL,MOTRIN Take 400 mg by mouth every 6 (six) hours as needed.   lisinopril-hydrochlorothiazide 20-12.5 MG tablet Commonly known as:  PRINZIDE,ZESTORETIC Take 1 tablet by mouth daily. Replaces:  lisinopril-hydrochlorothiazide 20-25 MG tablet   metoprolol tartrate 25 MG tablet Commonly known as:  LOPRESSOR Take 1 tablet (25  mg total) by mouth 2 (two) times daily.      Follow-up Information    Chuck Hintickson, Christopher S, MD. Schedule an appointment as soon as possible for a visit in 2 week(s).   Specialties:  Vascular Surgery, Cardiology Why:  Follow up AAA  Contact information: [address] [place] [address] [phone]        [facility] Schedule an appointment as soon as possible for a visit in 1 week(s).   Why:  Hospital Follow Up  Contact information: [address] [place] [address] [phone]        Laqueta LindenKoneswaran, Suresh A, MD. Schedule an appointment as soon as possible for a visit in 2 week(s).   Specialty:  Cardiology Why:  Hospital Follow Up  Contact information: [address] [place] [address] [phone]          No Known Allergies Allergies as of 12/15/2017   No Known Allergies     Medication List    STOP taking these  medications   lisinopril-hydrochlorothiazide 20-25 MG tablet Commonly known as:  PRINZIDE,ZESTORETIC Replaced by:  lisinopril-hydrochlorothiazide 20-12.5 MG tablet     TAKE these medications   aspirin 81 MG EC tablet Take 1 tablet (81 mg total) by mouth daily. Start taking on:  12/16/2017   docusate sodium 100 MG capsule Commonly known as:  COLACE Take 1 capsule (100 mg total) by mouth 2 (two) times daily as needed for mild constipation.   ibuprofen 200 MG tablet Commonly known as:  ADVIL,MOTRIN Take 400 mg by mouth every 6 (six) hours as needed.   lisinopril-hydrochlorothiazide 20-12.5 MG tablet Commonly known as:  PRINZIDE,ZESTORETIC Take 1 tablet by mouth daily. Replaces:  lisinopril-hydrochlorothiazide 20-25 MG tablet   metoprolol tartrate 25 MG tablet Commonly known as:  LOPRESSOR Take 1 tablet (25 mg total) by mouth 2 (two) times daily.       Procedures/Studies: [place] Renal  Result Date: 12/15/2017 CLINICAL DATA:  Acute kidney injury. Bilateral flank pain for 3 years. History of hypertension. EXAM: RENAL / URINARY TRACT ULTRASOUND COMPLETE COMPARISON:  None. FINDINGS: Right Kidney: Length: 13.1 centimeters. Mid to LOWER pole simple cyst is 1.9 centimeters. No hydronephrosis or suspicious mass. Left Kidney: Length: 14.5 centimeters. Normal echogenicity. No hydronephrosis. Midpole calculus is estimated to be 6 millimeters in diameter. No cystic or solid mass. Bladder: Bilateral ureteral jets are present. IMPRESSION: 1. No hydronephrosis. 2. RIGHT renal cyst. 3. Suspect intrarenal calculus in the midpole the RIGHT kidney, 6 millimeters in diameter. Electronically Signed   By: Norva PavlovElizabeth  Brown M.D.   On: 12/15/2017 11:07   Dg Chest Portable 1 View  Result Date: 12/14/2017 CLINICAL DATA:  62 year old male with chest pain, back pain, dizziness, diaphoresis and chills. EXAM: PORTABLE CHEST 1 VIEW COMPARISON:  Report of chest radiograph 10/09/2002 (no images available). FINDINGS:  Portable AP upright view at 1432 hours. Pacer or resuscitation pads projects over the left chest. Mediastinal contours are within normal limits. Visualized tracheal air column is within normal limits. No pneumothorax, pulmonary edema, pleural effusion or confluent pulmonary opacity. Paucity of bowel gas in the upper abdomen. Degenerative changes at the right [facility] joint. IMPRESSION: Negative.  No acute cardiopulmonary abnormality. Electronically Signed   By: Odessa FlemingH  Hall M.D.   On: 12/14/2017 14:42   [place] Abdominal Aorta Screening Aaa  Result Date: 12/15/2017 CLINICAL DATA:  Bilateral flank pain and vertigo. Pulsatile mass. History of hypertension, CAD. EXAM: ULTRASOUND OF ABDOMINAL AORTA TECHNIQUE: Ultrasound examination  of the abdominal aorta was performed to evaluate for abdominal aortic aneurysm. COMPARISON:  None. FINDINGS: Abdominal aortic measurements as follows: Proximal:  3.6 cm Mid:  2.6 cm Distal:  4.7 cm Mural thrombus is identified at the level of the LOWER aorta. Proximal iliac arteries are mildly ectatic, 1.3 centimeters on the RIGHT and 1.3 centimeters on the LEFT. Turbulent flow is identified within the aneurysm. Velocity within the distal aorta is 89 -108 centimeters/second. IMPRESSION: 1. 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 2. Turbulent flow in the aneurysm with peak velocity of 89-108. Electronically Signed   By: Norva Pavlov M.D.   On: 12/15/2017 11:11      Subjective: Pt reports that he is feeling much better.  He denies CP and SOB.    Discharge Exam: Vitals:   12/14/17 2101 12/15/17 0601  BP: (!) 130/93 (!) 139/102  Pulse: 69 63  Resp:    Temp: 97.8 F (36.6 C) 97.7 F (36.5 C)  SpO2: 96% 97%   Vitals:   12/14/17 1729 12/14/17 2054 12/14/17 2101 12/15/17 0601  BP: (!) 135/96  (!) 130/93 (!) 139/102  Pulse: 80  69 63  Resp:      Temp:   97.8 F (36.6 C) 97.7 F (36.5 C)  TempSrc:   Oral Oral  SpO2: 96% 96% 96% 97%   Weight:      Height:       General: Pt is alert, awake, not in acute distress Cardiovascular:  Normal S1/S2 +, no rubs, no gallops Respiratory: CTA bilaterally, no wheezing, no rhonchi Abdominal: Soft, NT, ND, bowel sounds + Extremities: no edema, no cyanosis   The results of significant diagnostics from this hospitalization (including imaging, microbiology, ancillary and laboratory) are listed below for reference.     Microbiology: No results found for this or any previous visit (from the past 240 hour(s)).   Labs: BNP (last 3 results) No results for input(s): BNP in the last 8760 hours. Basic Metabolic Panel: Recent Labs  Lab 12/14/17 1410 12/15/17 0346  NA 145 138  K 3.4* 3.3*  CL 108 104  CO2  --  23  GLUCOSE 163* 109*  BUN 26* 26*  CREATININE 2.00* 0.89  CALCIUM  --  9.3  MG  --  1.9   Liver Function Tests: Recent Labs  Lab 12/15/17 0346  AST 34  ALT 26  ALKPHOS 35*  BILITOT 1.2  PROT 7.0  ALBUMIN 3.6   No results for input(s): LIPASE, AMYLASE in the last 168 hours. No results for input(s): AMMONIA in the last 168 hours. CBC: Recent Labs  Lab 12/14/17 1407 12/14/17 1410 12/15/17 0346  WBC 12.9*  --  9.0  NEUTROABS 9.8*  --   --   HGB 17.3* 17.0 15.7  HCT 50.6 50.0 46.4  MCV 95.3  --  94.5  PLT 268  --  253   Cardiac Enzymes: Recent Labs  Lab 12/14/17 1607 12/14/17 2158 12/15/17 0346 12/15/17 0910  TROPONINI 0.47* 1.68* 1.84* 1.37*   BNP: Invalid input(s): POCBNP CBG: No results for input(s): GLUCAP in the last 168 hours. D-Dimer No results for input(s): DDIMER in the last 72 hours. Hgb A1c Recent Labs    12/15/17 0346  HGBA1C 5.5   Lipid Profile Recent Labs    12/15/17 0346  CHOL 135  HDL 31*  LDLCALC 77  TRIG 161  CHOLHDL 4.4   Thyroid function studies Recent Labs    12/15/17 0910  TSH  0.480   Anemia work up No results for input(s): VITAMINB12, FOLATE, FERRITIN, TIBC, IRON, RETICCTPCT in the last 72  hours. Urinalysis No results found for: COLORURINE, APPEARANCEUR, LABSPEC, PHURINE, GLUCOSEU, HGBUR, BILIRUBINUR, KETONESUR, PROTEINUR, UROBILINOGEN, NITRITE, LEUKOCYTESUR Sepsis Labs Invalid input(s): PROCALCITONIN,  WBC,  LACTICIDVEN Microbiology No results found for this or any previous visit (from the past 240 hour(s)).  Time coordinating discharge: 34 mins  SIGNED:  Standley Dakins, MD  [facility] 12/15/2017, 3:09 PM Pager [phone]  If 7PM-7AM, please contact night-coverage [email or web address] Password [email or web address]

## 2017-12-28 ENCOUNTER — Other Ambulatory Visit: Payer: Self-pay

## 2017-12-28 ENCOUNTER — Ambulatory Visit (INDEPENDENT_AMBULATORY_CARE_PROVIDER_SITE_OTHER): Payer: Self-pay | Admitting: Vascular Surgery

## 2017-12-28 ENCOUNTER — Encounter: Payer: Self-pay | Admitting: Vascular Surgery

## 2017-12-28 VITALS — BP 132/87 | HR 54 | Temp 98.0°F | Resp 20 | Ht 65.0 in | Wt 226.0 lb

## 2017-12-28 DIAGNOSIS — I714 Abdominal aortic aneurysm, without rupture, unspecified: Secondary | ICD-10-CM

## 2017-12-28 NOTE — Progress Notes (Signed)
Patient name: Luisdaniel Kenton MRN: [id number] DOB: 04/15/1956 Sex: male   REASON FOR CONSULT:    Abdominal aortic aneurysm.  The consult is requested by Dr. Wyline Mood.  HPI:   Maksymilian Mabey is a pleasant 62 y.o. male, who is referred with an abdominal aortic aneurysm. Of note, the history is obtained through the translator.  The patient had reportedly been having some flank pain which prompted an ultrasound which showed a 4.7 cm infrarenal abdominal aortic aneurysm.  He was set up for an elective outpatient evaluation.  The patient was admitted at [facility] with supraventricular tachycardia and elevated troponins.  He underwent an extensive work-up at that time.  He was having some flank pain which prompted the ultrasound which showed the aneurysm.  He denies any significant abdominal pain or back pain currently.  He has no family history of aneurysmal disease.  He quit smoking in 1995.  His blood pressures been under good control.  I did review his hospitalization from 12/14/2017 to 12/15/2017.  He had been experiencing a pounding in his chest and radiation of the pain to his neck and jaw.  In reviewing these records, he states that he was told he had a large blood vessel in his stomach when he was in [place] and they offered to fix this.  He decided against surgery.  The patient was admitted for management of his SVT and discharged the following day.  He did have elevated troponins during that admission but this was felt to be secondary to demand ischemia.  Patient was to have an outpatient work-up.  His blood pressure was under good control during this hospitalization.  Past Medical History:  Diagnosis Date  . AKI (acute kidney injury) (HCC) 12/14/2017  . HTN (hypertension) 12/14/2017  . SVT (supraventricular tachycardia) (HCC) 12/14/2017    History reviewed. No pertinent family history.  There is no family history of aneurysmal disease or premature cardiovascular disease.  SOCIAL  HISTORY: He quit tobacco in 1995. Social History   Socioeconomic History  . Marital status: Married    Spouse name: Not on file  . Number of children: Not on file  . Years of education: Not on file  . Highest education level: Not on file  Occupational History  . Not on file  Social Needs  . Financial resource strain: Not on file  . Food insecurity:    Worry: Not on file    Inability: Not on file  . Transportation needs:    Medical: Not on file    Non-medical: Not on file  Tobacco Use  . Smoking status: Former Smoker    Types: Cigarettes    Last attempt to quit: 08/29/1993    Years since quitting: 24.3  . Smokeless tobacco: Never Used  Substance and Sexual Activity  . Alcohol use: Yes    Comment: occasionally  . Drug use: Not Currently  . Sexual activity: Not on file  Lifestyle  . Physical activity:    Days per week: Not on file    Minutes per session: Not on file  . Stress: Not on file  Relationships  . Social connections:    Talks on phone: Not on file    Gets together: Not on file    Attends religious service: Not on file    Active member of club or organization: Not on file    Attends meetings of clubs or organizations: Not on file    Relationship status: Not on file  .  Intimate partner violence:    Fear of current or ex partner: Not on file    Emotionally abused: Not on file    Physically abused: Not on file    Forced sexual activity: Not on file  Other Topics Concern  . Not on file  Social History Narrative  . Not on file    No Known Allergies  Current Outpatient Medications  Medication Sig Dispense Refill  . aspirin EC 81 MG EC tablet Take 1 tablet (81 mg total) by mouth daily.    Marland Kitchen ibuprofen (ADVIL,MOTRIN) 200 MG tablet Take 400 mg by mouth every 6 (six) hours as needed.    Marland Kitchen lisinopril-hydrochlorothiazide (PRINZIDE,ZESTORETIC) 20-12.5 MG tablet Take 1 tablet by mouth daily. 30 tablet 0  . metoprolol tartrate (LOPRESSOR) 25 MG tablet Take 1 tablet (25  mg total) by mouth 2 (two) times daily. 60 tablet 0  . docusate sodium (COLACE) 100 MG capsule Take 1 capsule (100 mg total) by mouth 2 (two) times daily as needed for mild constipation. (Patient not taking: Reported on 12/28/2017) 30 capsule 0   No current facility-administered medications for this visit.     REVIEW OF SYSTEMS:   denotes positive finding,  denotes negative finding Cardiac  Comments:  Chest pain or chest pressure: x   Shortness of breath upon exertion: x   Short of breath when lying flat:    Irregular heart rhythm:        Vascular    Pain in calf, thigh, or hip brought on by ambulation:    Pain in feet at night that wakes you up from your sleep:     Blood clot in your veins:    Leg swelling:         Pulmonary    Oxygen at home:    Productive cough:     Wheezing:         Neurologic    Sudden weakness in arms or legs:     Sudden numbness in arms or legs:     Sudden onset of difficulty speaking or slurred speech:    Temporary loss of vision in one eye:     Problems with dizziness:  x       Gastrointestinal    Blood in stool:     Vomited blood:         Genitourinary    Burning when urinating:  x   Blood in urine:        Psychiatric    Major depression:         Hematologic    Bleeding problems:    Problems with blood clotting too easily:        Skin    Rashes or ulcers:        Constitutional    Fever or chills:     PHYSICAL EXAM:   Vitals:   12/28/17 1110  BP: 132/87  Pulse: (!) 54  Resp: 20  Temp: 98 F (36.7 C)  TempSrc: Oral  SpO2: 96%  Weight: 226 lb (102.5 kg)  Height:  (1.651 m)   Body mass index is 37.61 kg/m.   GENERAL: The patient is a well-nourished male, in no acute distress. The vital signs are documented above. CARDIAC: There is a regular rate and rhythm.  VASCULAR: I do not detect carotid bruits. He has palpable femoral, popliteal, and pedal pulses bilaterally. He has no significant lower extremity  swelling. PULMONARY: There is good air exchange bilaterally without wheezing or  rales. ABDOMEN: Soft and non-tender with normal pitched bowel sounds.  He has a large abdomen and it is difficult to assess for an aneurysm. MUSCULOSKELETAL: There are no major deformities or cyanosis. NEUROLOGIC: No focal weakness or paresthesias are detected. SKIN: There are no ulcers or rashes noted. PSYCHIATRIC: The patient has a normal affect.  DATA:    LABS: Labs on 12/15/2017 show a GFR of greater than 60.  Creatinine is 0.89.  DUPLEX ABDOMINAL AORTA: This patient had a duplex of the abdominal aorta work-up bilateral flank pain.  This study was done on 12/15/2017.  The distal abdominal aorta measured 4.7 cm in maximum diameter.  The right common iliac artery measured 1.3 cm in diameter in the left common iliac artery measured 1.3 cm in diameter.  MEDICAL ISSUES:   ASYMPTOMATIC 4.7 CM INFRARENAL ABDOMINAL AORTIC ANEURYSM: This patient has an asymptomatic 4.7 cm aneurysm.  I explained we would not consider elective repair in a normal risk patient unless the aneurysm reached 5.5 cm in maximum diameter.  I explained that for an aneurysm of this size, the risk of surgery is greater than the risk of rupture.  He is not a smoker.  It appears that his blood pressure is under good control.  I have explained that blood pressure and smoking of the 2 main risk factors for continued aneurysm expansion.  I have recommended a follow-up ultrasound in 6 months and I have ordered that test.  If the aneurysm does enlarge significantly then we would need a CT scan to determine if he might be a candidate for an endovascular repair.  Certainly he would be at increased risk for open surgery given his obesity (BMI is 38) and his cardiac history.  I will see him back in 6 months.  He knows to call sooner if he has problems.  Waverly Ferrari [facility] [phone]

## 2017-12-29 ENCOUNTER — Emergency Department (HOSPITAL_COMMUNITY)
Admission: EM | Admit: 2017-12-29 | Discharge: 2017-12-29 | Disposition: A | Discharge status: Home / Self Care | Payer: Self-pay | Attending: Emergency Medicine | Admitting: Emergency Medicine

## 2017-12-29 ENCOUNTER — Emergency Department (HOSPITAL_COMMUNITY): Payer: Self-pay

## 2017-12-29 ENCOUNTER — Encounter (HOSPITAL_COMMUNITY): Payer: Self-pay | Admitting: Emergency Medicine

## 2017-12-29 ENCOUNTER — Other Ambulatory Visit: Payer: Self-pay

## 2017-12-29 DIAGNOSIS — F17210 Nicotine dependence, cigarettes, uncomplicated: Secondary | ICD-10-CM | POA: Insufficient documentation

## 2017-12-29 DIAGNOSIS — I10 Essential (primary) hypertension: Secondary | ICD-10-CM | POA: Insufficient documentation

## 2017-12-29 DIAGNOSIS — I471 Supraventricular tachycardia: Secondary | ICD-10-CM | POA: Insufficient documentation

## 2017-12-29 LAB — COMPREHENSIVE METABOLIC PANEL
ALK PHOS: 48 U/L (ref 38–126)
ALT: 25 U/L (ref 17–63)
ANION GAP: 10 (ref 5–15)
AST: 21 U/L (ref 15–41)
Albumin: 3.4 g/dL — ABNORMAL LOW (ref 3.5–5.0)
BUN: 18 mg/dL (ref 6–20)
CALCIUM: 8.7 mg/dL — AB (ref 8.9–10.3)
CO2: 23 mmol/L (ref 22–32)
CREATININE: 0.71 mg/dL (ref 0.61–1.24)
Chloride: 107 mmol/L (ref 101–111)
GFR calc Af Amer: >60 mL/min (ref >60)
Glucose, Bld: 114 mg/dL — ABNORMAL HIGH (ref 65–99)
Potassium: 3.8 mmol/L (ref 3.5–5.1)
SODIUM: 140 mmol/L (ref 135–145)
Total Bilirubin: 0.8 mg/dL (ref 0.3–1.2)
Total Protein: 6.7 g/dL (ref 6.5–8.1)

## 2017-12-29 LAB — CBC WITH DIFFERENTIAL/PLATELET
Basophils Absolute: 0.1 10*3/uL (ref 0.0–0.1)
Basophils Relative: 1 %
EOS ABS: 0.2 10*3/uL (ref 0.0–0.7)
EOS PCT: 3 %
HCT: 48.4 % (ref 39.0–52.0)
HEMOGLOBIN: 16.3 g/dL (ref 13.0–17.0)
LYMPHS ABS: 2.3 10*3/uL (ref 0.7–4.0)
LYMPHS PCT: 35 %
MCH: 32.1 pg (ref 26.0–34.0)
MCHC: 33.7 g/dL (ref 30.0–36.0)
MCV: 95.5 fL (ref 78.0–100.0)
MONOS PCT: 13 %
Monocytes Absolute: 0.9 10*3/uL (ref 0.1–1.0)
Neutro Abs: 3.3 10*3/uL (ref 1.7–7.7)
Neutrophils Relative %: 48 %
PLATELETS: 217 10*3/uL (ref 150–400)
RBC: 5.07 MIL/uL (ref 4.22–5.81)
RDW: 14.2 % (ref 11.5–15.5)
WBC: 6.7 10*3/uL (ref 4.0–10.5)

## 2017-12-29 LAB — URINALYSIS, ROUTINE W REFLEX MICROSCOPIC
BILIRUBIN URINE: NEGATIVE
GLUCOSE, UA: NEGATIVE mg/dL
Hgb urine dipstick: NEGATIVE
KETONES UR: NEGATIVE mg/dL
Leukocytes, UA: NEGATIVE
NITRITE: NEGATIVE
PH: 8 (ref 5.0–8.0)
Protein, ur: NEGATIVE mg/dL
SPECIFIC GRAVITY, URINE: 1.004 — AB (ref 1.005–1.030)

## 2017-12-29 LAB — TROPONIN I

## 2017-12-29 MED ORDER — ADENOSINE 6 MG/2ML IV SOLN
INTRAVENOUS | Status: AC
  Administered 2017-12-29: 6 mg via INTRAVENOUS
  Filled 2017-12-29: qty 8

## 2017-12-29 MED ORDER — SODIUM CHLORIDE 0.9 % IV BOLUS
500.0000 mL | Freq: Once | INTRAVENOUS | Status: AC
  Administered 2017-12-29: 500 mL via INTRAVENOUS

## 2017-12-29 MED ORDER — ADENOSINE 6 MG/2ML IV SOLN
6.0000 mg | Freq: Once | INTRAVENOUS | Status: AC
  Administered 2017-12-29: 12 mg via INTRAVENOUS

## 2017-12-29 MED ORDER — ADENOSINE 6 MG/2ML IV SOLN
12.0000 mg | Freq: Once | INTRAVENOUS | Status: AC
  Administered 2017-12-29: 6 mg via INTRAVENOUS

## 2017-12-29 MED ORDER — METOPROLOL TARTRATE 50 MG PO TABS: 50.0000 mg | ORAL_TABLET | Freq: Two times a day (BID) | ORAL | 1 refills | Status: DC

## 2017-12-29 NOTE — ED Triage Notes (Signed)
Pt comes in for throat pain, during triage HR is 182. Pt denies CP at this time. Pt is hot, diaphoretic, and c/o palpitations.

## 2017-12-29 NOTE — ED Notes (Signed)
Pharm tech states she has just completed pt hx

## 2017-12-29 NOTE — ED Notes (Signed)
Pt moved off the floor prior to his discharge  Pt discharged, questions answered thru translation services Marja Kays, [phone]  Discharged was prolonged due to language and cultural

## 2017-12-29 NOTE — ED Notes (Signed)
Dr Adriana Simas in to re eval and discuss findings with pt thru translation services

## 2017-12-29 NOTE — Discharge Instructions (Signed)
Increase Lopressor to 50 mg twice daily.  Follow-up with cardiology next week.  Phone number given.  You must call them for an appointment.

## 2017-12-30 NOTE — ED Provider Notes (Signed)
[facility] EMERGENCY DEPARTMENT Provider Note   CSN: [id number] Arrival date & time: 12/29/17  4098     History   Chief Complaint Chief Complaint  Patient presents with  . Tachycardia    HPI Jason Sweeney is a 62 y.o. male.  Level 5 caveat for urgent need for intervention.  Patient presents with a rapid pulse and a sensation of tightness in his chest a brief time ago.  He had a similar episode of this recently and was diagnosed with SVT which responded to IV adenosine.     Past Medical History:  Diagnosis Date  . AKI (acute kidney injury) (HCC) 12/14/2017  . HTN (hypertension) 12/14/2017  . SVT (supraventricular tachycardia) (HCC) 12/14/2017    Patient Active Problem List   Diagnosis Date Noted  . AAA (abdominal aortic aneurysm) without rupture (HCC) 12/15/2017  . Abnormal [place] (ultrasound) of abdomen   . SVT (supraventricular tachycardia) (HCC) 12/14/2017  . AKI (acute kidney injury) (HCC) 12/14/2017  . HTN (hypertension) 12/14/2017  . Elevated troponin 12/14/2017    Past Surgical History:  Procedure Laterality Date  . ABDOMINAL SURGERY    . SHOULDER SURGERY Left   . SHOULDER SURGERY          Home Medications    Prior to Admission medications   Medication Sig Start Date End Date Taking? Authorizing Provider  aspirin EC 81 MG EC tablet Take 1 tablet (81 mg total) by mouth daily. 12/16/17  Yes Johnson, Clanford L, MD  docusate sodium (COLACE) 100 MG capsule Take 1 capsule (100 mg total) by mouth 2 (two) times daily as needed for mild constipation. 12/15/17  Yes Johnson, Clanford L, MD  ibuprofen (ADVIL,MOTRIN) 200 MG tablet Take 400 mg by mouth every 6 (six) hours as needed.   Yes [provider]  lisinopril-hydrochlorothiazide (PRINZIDE,ZESTORETIC) 20-12.5 MG tablet Take 1 tablet by mouth daily. 12/15/17 01/14/18 Yes Johnson, Clanford L, MD  metoprolol tartrate (LOPRESSOR) 50 MG tablet Take 1 tablet (50 mg total) by mouth 2 (two) times daily. 12/29/17   [name], MD    Family History History reviewed. No pertinent family history.  Social History Social History   Tobacco Use  . Smoking status: Former Smoker    Types: Cigarettes    Last attempt to quit: 08/29/1993    Years since quitting: 24.3  . Smokeless tobacco: Never Used  Substance Use Topics  . Alcohol use: Yes    Comment: occasionally  . Drug use: Not Currently     Allergies   Patient has no known allergies.   Review of Systems Review of Systems  Unable to perform ROS: Acuity of condition     Physical Exam Updated Vital Signs BP 137/90   Pulse (!) 54   Resp 12   Ht  (1.651 m)   Wt 102.5 kg (226 lb)   SpO2 99%   BMI 37.61 kg/m   Physical Exam  Constitutional: He is oriented to person, place, and time.  Overweight, moderate distress  HENT:  Head: Normocephalic and atraumatic.  Eyes: Conjunctivae are normal.  Neck: Neck supple.  Cardiovascular: Regular rhythm.  Tachycardic  Pulmonary/Chest: Effort normal and breath sounds normal.  Abdominal: Soft. Bowel sounds are normal.  Musculoskeletal: Normal range of motion.  Neurological: He is alert and oriented to person, place, and time.  Skin: Skin is warm and dry.  Psychiatric: He has a normal mood and affect. His behavior is normal.  Nursing note and vitals reviewed.  ED Treatments / Results  Labs (all labs ordered are listed, but only abnormal results are displayed) Labs Reviewed  COMPREHENSIVE METABOLIC PANEL - Abnormal; Notable for the following components:      Result Value   Glucose, Bld 114 (*)    Calcium 8.7 (*)    Albumin 3.4 (*)    All other components within normal limits  URINALYSIS, ROUTINE W REFLEX MICROSCOPIC - Abnormal; Notable for the following components:   Color, Urine STRAW (*)    Specific Gravity, Urine 1.004 (*)    All other components within normal limits  CBC WITH DIFFERENTIAL/PLATELET  TROPONIN I    EKG EKG Interpretation  Date/Time:  Friday Dec 29 2017  09:46:02 EDT Ventricular Rate:  70 PR Interval:    QRS Duration: 88 QT Interval:  401 QTC Calculation: 433 R Axis:   -12 Text Interpretation:  Atrial fibrillation Confirmed by [name] ([id number]) on 12/29/2017 10:15:17 AM   Radiology Dg Chest Port 1 View  Result Date: 12/29/2017 CLINICAL DATA:  Throat pain.  Elevated heart rate. EXAM: PORTABLE CHEST 1 VIEW COMPARISON:  Chest x-ray 12/14/2017. FINDINGS: Cardiomegaly with normal pulmonary vascularity. Low lung volumes. No focal infiltrate. No pleural effusion or pneumothorax. IMPRESSION: 1.  Cardiomegaly with normal pulmonary vascularity. 2.  Low lung volumes.  No acute pulmonary disease. Electronically Signed   By: Maisie Fus  Register   On: 12/29/2017 10:27    Procedures Procedures (including critical care time)  Medications Ordered in ED Medications  adenosine (ADENOCARD) 6 MG/2ML injection 6 mg (12 mg Intravenous Given 12/29/17 0939)  adenosine (ADENOCARD) 6 MG/2ML injection 12 mg (6 mg Intravenous Given 12/29/17 0937)  sodium chloride 0.9 % bolus 500 mL (0 mLs Intravenous Stopped 12/29/17 1117)     Initial Impression / Assessment and Plan / ED Course  I have reviewed the triage vital signs and the nursing notes.  Pertinent labs & imaging results that were available during my care of the patient were reviewed by me and considered in my medical decision making (see chart for details).     Patient presents with tachycardia and EKG consistent with supraventricular tachycardia.  I initiated adenosine 6 mg followed by adenosine 12 mg IV.  He then converted to normal sinus rhythm.  Patient remained hemodynamically stable in the emergency department.  Troponin negative.  Discussed care with cardiologist on call.  I will increase his metoprolol from 25 mg twice a day to 50 mg twice a day.  Additionally he takes lisinopril/hydrochlorothiazide 20/12.5.  He will follow-up with cardiology.  This was discussed with the patient and his wife over the  language line.   CRITICAL CARE Performed by: [name] Total critical care time: 50 minutes Critical care time was exclusive of separately billable procedures and treating other patients. Critical care was necessary to treat or prevent imminent or life-threatening deterioration. Critical care was time spent personally by me on the following activities: development of treatment plan with patient and/or surrogate as well as nursing, discussions with consultants, evaluation of patient's response to treatment, examination of patient, obtaining history from patient or surrogate, ordering and performing treatments and interventions, ordering and review of laboratory studies, ordering and review of radiographic studies, pulse oximetry and re-evaluation of patient's condition.  Final Clinical Impressions(s) / ED Diagnoses   Final diagnoses:  SVT (supraventricular tachycardia) [facility])    ED Discharge Orders        Ordered    metoprolol tartrate (LOPRESSOR) 50 MG tablet  2 times daily  12/29/17 1430       [name], MD 12/30/17 [phone]

## 2018-01-08 ENCOUNTER — Encounter: Payer: Self-pay | Admitting: Cardiovascular Disease

## 2018-01-08 ENCOUNTER — Encounter: Payer: Self-pay | Admitting: *Deleted

## 2018-01-08 ENCOUNTER — Ambulatory Visit (INDEPENDENT_AMBULATORY_CARE_PROVIDER_SITE_OTHER): Payer: Self-pay | Admitting: Cardiovascular Disease

## 2018-01-08 VITALS — BP 144/82 | HR 50 | Ht 65.0 in | Wt 226.0 lb

## 2018-01-08 DIAGNOSIS — I471 Supraventricular tachycardia: Secondary | ICD-10-CM

## 2018-01-08 DIAGNOSIS — R778 Other specified abnormalities of plasma proteins: Secondary | ICD-10-CM

## 2018-01-08 DIAGNOSIS — M542 Cervicalgia: Secondary | ICD-10-CM

## 2018-01-08 DIAGNOSIS — I248 Other forms of acute ischemic heart disease: Secondary | ICD-10-CM

## 2018-01-08 DIAGNOSIS — I714 Abdominal aortic aneurysm, without rupture, unspecified: Secondary | ICD-10-CM

## 2018-01-08 DIAGNOSIS — R0609 Other forms of dyspnea: Secondary | ICD-10-CM

## 2018-01-08 DIAGNOSIS — R748 Abnormal levels of other serum enzymes: Secondary | ICD-10-CM

## 2018-01-08 DIAGNOSIS — R7989 Other specified abnormal findings of blood chemistry: Secondary | ICD-10-CM

## 2018-01-08 DIAGNOSIS — R079 Chest pain, unspecified: Secondary | ICD-10-CM

## 2018-01-08 DIAGNOSIS — I10 Essential (primary) hypertension: Secondary | ICD-10-CM

## 2018-01-08 MED ORDER — AMLODIPINE BESYLATE 5 MG PO TABS: 5.0000 mg | ORAL_TABLET | Freq: Every day | ORAL | 6 refills | Status: DC

## 2018-01-08 NOTE — Progress Notes (Signed)
SUBJECTIVE: The patient presents for follow-up after being hospitalized for SVT and elevated troponin.  SVT converted to sinus rhythm with 12 mg of IV adenosine.  He was also hypokalemic.  Troponins peaked at 1.84.  He had no exertional symptoms so an outpatient stress test was recommended.  Echocardiogram demonstrated normal left ventricular systolic function and normal diastolic function with normal regional wall motion, LVEF 60 to 65%, with moderate to severe LVH.  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  He is here with his son and wife.  His son helps to translate.  The patient has been experiencing neck pain associated with chest pain and shortness of breath.  The chest pain occurs intermittently throughout the day and can occur both at rest and with exertion.  He was again seen in the ED on 12/29/2017.  I reviewed all relevant documentation, labs, and studies.  I personally reviewed the ECG which demonstrated SVT at a heart rate of 156 bpm.  Follow-up ECG after 12 mg of IV adenosine demonstrated sinus rhythm.  Troponin and potassium were normal at that time.  CBC was also normal as was renal function. Metoprolol was increased from 25 mg twice daily to 50 mg twice daily.  He saw vascular surgery and a follow-up ultrasound is planned for July 04, 2018.     Review of Systems: As per "subjective", otherwise negative.  No Known Allergies  Current Outpatient Medications  Medication Sig Dispense Refill  . aspirin EC 81 MG EC tablet Take 1 tablet (81 mg total) by mouth daily.    Marland Kitchen docusate sodium (COLACE) 100 MG capsule Take 1 capsule (100 mg total) by mouth 2 (two) times daily as needed for mild constipation. 30 capsule 0  . ibuprofen (ADVIL,MOTRIN) 200 MG tablet Take 400 mg by mouth every 6 (six) hours as needed.    . metoprolol tartrate (LOPRESSOR) 50 MG tablet Take 1 tablet (50 mg total) by mouth 2 (two) times daily. 60 tablet 1   No current  facility-administered medications for this visit.     Past Medical History:  Diagnosis Date  . AKI (acute kidney injury) (HCC) 12/14/2017  . HTN (hypertension) 12/14/2017  . SVT (supraventricular tachycardia) (HCC) 12/14/2017    Past Surgical History:  Procedure Laterality Date  . ABDOMINAL SURGERY    . SHOULDER SURGERY Left   . SHOULDER SURGERY      Social History   Socioeconomic History  . Marital status: Married    Spouse name: Not on file  . Number of children: Not on file  . Years of education: Not on file  . Highest education level: Not on file  Occupational History  . Not on file  Social Needs  . Financial resource strain: Not on file  . Food insecurity:    Worry: Not on file    Inability: Not on file  . Transportation needs:    Medical: Not on file    Non-medical: Not on file  Tobacco Use  . Smoking status: Former Smoker    Types: Cigarettes    Last attempt to quit: 08/29/1993    Years since quitting: 24.3  . Smokeless tobacco: Never Used  Substance and Sexual Activity  . Alcohol use: Yes    Comment: occasionally  . Drug use: Not Currently  . Sexual activity: Not on file  Lifestyle  . Physical activity:    Days per week: Not on file  Minutes per session: Not on file  . Stress: Not on file  Relationships  . Social connections:    Talks on phone: Not on file    Gets together: Not on file    Attends religious service: Not on file    Active member of club or organization: Not on file    Attends meetings of clubs or organizations: Not on file    Relationship status: Not on file  . Intimate partner violence:    Fear of current or ex partner: Not on file    Emotionally abused: Not on file    Physically abused: Not on file    Forced sexual activity: Not on file  Other Topics Concern  . Not on file  Social History Narrative  . Not on file     Vitals:   01/08/18 1116  BP: (!) 144/82  Pulse: (!) 50  SpO2: 98%  Weight: 226 lb (102.5 kg)  Height:   (1.651 m)    Wt Readings from Last 3 Encounters:  01/08/18 226 lb (102.5 kg)  12/29/17 226 lb (102.5 kg)  12/28/17 226 lb (102.5 kg)     PHYSICAL EXAM General: NAD HEENT: Normal. Neck: No JVD, no thyromegaly. Lungs: Clear to auscultation bilaterally with normal respiratory effort. CV: Regular rate and rhythm, normal S1/S2, no S3/S4, no murmur. No pretibial or periankle edema.   Abdomen: Soft, nontender, no distention.  Neurologic: Alert and oriented.  Psych: Normal affect. Skin: Normal. Musculoskeletal: No gross deformities.    ECG: Most recent ECG reviewed.   Labs: Lab Results  Component Value Date/Time   K 3.8 12/29/2017 10:23 AM   BUN 18 12/29/2017 10:23 AM   CREATININE 0.71 12/29/2017 10:23 AM   ALT 25 12/29/2017 10:23 AM   TSH 0.480 12/15/2017 09:10 AM   HGB 16.3 12/29/2017 10:23 AM     Lipids: Lab Results  Component Value Date/Time   LDLCALC 77 12/15/2017 03:46 AM   CHOL 135 12/15/2017 03:46 AM   TRIG 134 12/15/2017 03:46 AM   HDL 31 (L) 12/15/2017 03:46 AM       ASSESSMENT AND PLAN:  1.  Paroxysmal SVT: Symptomatically stable on metoprolol tartrate 50 mg twice daily.  No changes.  If this continues to recur, I would consider an EP referral for ablation.  2.  Elevated troponin/demand ischemia with chest pain/neck pain/exertional dyspnea: Symptoms are suspicious for ischemic heart disease.  I will obtain exercise Myoview stress test to evaluate for ischemic heart disease. He is currently on aspirin and metoprolol.  I have instructed the patient and his son to hold metoprolol the morning of the stress test.  3.  Abdominal aortic aneurysm with thrombus: He follows with vascular surgery.  Repeat ultrasound is planned for November 2019.  4.  Hypertension: Blood pressure is elevated.  I will start amlodipine 5 mg daily.   Disposition: Follow up 6 weeks   Prentice Docker, M.D., F.A.C.C.

## 2018-01-08 NOTE — Patient Instructions (Signed)
Medication Instructions:   Begin Norvasc  daily.  Continue all other medications.    Labwork: none  Testing/Procedures:  Your physician has requested that you have an exercise stress myoview. For further information please visit [email or web address] Please follow instruction sheet, as given.  Office will contact with results via phone or letter.    Follow-Up: 6 weeks   Any Other Special Instructions Will Be Listed Below (If Applicable).  If you need a refill on your cardiac medications before your next appointment, please call your pharmacy.

## 2018-01-11 ENCOUNTER — Encounter (HOSPITAL_COMMUNITY): Payer: Self-pay | Admitting: Emergency Medicine

## 2018-01-11 ENCOUNTER — Emergency Department (HOSPITAL_COMMUNITY): Payer: Self-pay

## 2018-01-11 ENCOUNTER — Emergency Department (HOSPITAL_COMMUNITY)
Admission: EM | Admit: 2018-01-11 | Discharge: 2018-01-11 | Disposition: A | Discharge status: Home / Self Care | Payer: Self-pay | Attending: Emergency Medicine | Admitting: Emergency Medicine

## 2018-01-11 ENCOUNTER — Other Ambulatory Visit: Payer: Self-pay

## 2018-01-11 DIAGNOSIS — I471 Supraventricular tachycardia: Secondary | ICD-10-CM

## 2018-01-11 DIAGNOSIS — R2243 Localized swelling, mass and lump, lower limb, bilateral: Secondary | ICD-10-CM | POA: Insufficient documentation

## 2018-01-11 DIAGNOSIS — F419 Anxiety disorder, unspecified: Secondary | ICD-10-CM | POA: Insufficient documentation

## 2018-01-11 DIAGNOSIS — Z79899 Other long term (current) drug therapy: Secondary | ICD-10-CM | POA: Insufficient documentation

## 2018-01-11 DIAGNOSIS — Z7982 Long term (current) use of aspirin: Secondary | ICD-10-CM | POA: Insufficient documentation

## 2018-01-11 DIAGNOSIS — Z87891 Personal history of nicotine dependence: Secondary | ICD-10-CM | POA: Insufficient documentation

## 2018-01-11 DIAGNOSIS — I10 Essential (primary) hypertension: Secondary | ICD-10-CM | POA: Insufficient documentation

## 2018-01-11 LAB — CBC WITH DIFFERENTIAL/PLATELET
BASOS PCT: 1 %
Basophils Absolute: 0.1 10*3/uL (ref 0.0–0.1)
EOS ABS: 0.3 10*3/uL (ref 0.0–0.7)
EOS PCT: 4 %
HCT: 51.3 % (ref 39.0–52.0)
Hemoglobin: 17.5 g/dL — ABNORMAL HIGH (ref 13.0–17.0)
LYMPHS ABS: 3.3 10*3/uL (ref 0.7–4.0)
Lymphocytes Relative: 41 %
MCH: 32.5 pg (ref 26.0–34.0)
MCHC: 34.1 g/dL (ref 30.0–36.0)
MCV: 95.4 fL (ref 78.0–100.0)
Monocytes Absolute: 1 10*3/uL (ref 0.1–1.0)
Monocytes Relative: 12 %
NEUTROS PCT: 42 %
Neutro Abs: 3.4 10*3/uL (ref 1.7–7.7)
PLATELETS: 237 10*3/uL (ref 150–400)
RBC: 5.38 MIL/uL (ref 4.22–5.81)
RDW: 14.3 % (ref 11.5–15.5)
WBC: 8.1 10*3/uL (ref 4.0–10.5)

## 2018-01-11 LAB — COMPREHENSIVE METABOLIC PANEL
ALBUMIN: 4 g/dL (ref 3.5–5.0)
ALT: 39 U/L (ref 17–63)
ANION GAP: 11 (ref 5–15)
AST: 30 U/L (ref 15–41)
Alkaline Phosphatase: 47 U/L (ref 38–126)
BUN: 14 mg/dL (ref 6–20)
CALCIUM: 9.2 mg/dL (ref 8.9–10.3)
CHLORIDE: 100 mmol/L — AB (ref 101–111)
CO2: 25 mmol/L (ref 22–32)
Creatinine, Ser: 0.96 mg/dL (ref 0.61–1.24)
GFR calc non Af Amer: >60 mL/min (ref >60)
Glucose, Bld: 142 mg/dL — ABNORMAL HIGH (ref 65–99)
Potassium: 3.6 mmol/L (ref 3.5–5.1)
Sodium: 136 mmol/L (ref 135–145)
TOTAL PROTEIN: 7.7 g/dL (ref 6.5–8.1)
Total Bilirubin: 1.2 mg/dL (ref 0.3–1.2)

## 2018-01-11 LAB — TROPONIN I

## 2018-01-11 LAB — MAGNESIUM: Magnesium: 1.9 mg/dL (ref 1.7–2.4)

## 2018-01-11 MED ORDER — ADENOSINE 6 MG/2ML IV SOLN
INTRAVENOUS | Status: AC
  Administered 2018-01-11: 12 mg
  Filled 2018-01-11: qty 4

## 2018-01-11 MED ORDER — POTASSIUM CHLORIDE CRYS ER 20 MEQ PO TBCR
40.0000 meq | EXTENDED_RELEASE_TABLET | Freq: Once | ORAL | Status: AC
  Administered 2018-01-11: 40 meq via ORAL
  Filled 2018-01-11: qty 2

## 2018-01-11 MED ORDER — METOPROLOL TARTRATE 75 MG PO TABS: 75.0000 mg | ORAL_TABLET | Freq: Two times a day (BID) | ORAL | 0 refills | Status: DC

## 2018-01-11 NOTE — ED Triage Notes (Signed)
PT states high heart rate and chest pain that started this am around 0630. PT states multiple ED visits recently for dx of SVT.

## 2018-01-11 NOTE — ED Provider Notes (Signed)
[facility] EMERGENCY DEPARTMENT Provider Note   CSN: [id number] Arrival date & time: 01/11/18  1308     History   Chief Complaint Chief Complaint  Patient presents with  . Chest Pain    HPI Jason Sweeney is a 61 y.o. male.  HPI Patient with recent diagnosis of SVT has been evaluated in the emergency department 2 times for similar symptoms.  Presents with acute onset palpitations radiating up into his throat starting around 7:00 this morning.  Similar to previous presentations.  States he has been compliant with medications as prescribed.  Was seen by his cardiologist 3 days ago Past Medical History:  Diagnosis Date  . AKI (acute kidney injury) (HCC) 12/14/2017  . HTN (hypertension) 12/14/2017  . SVT (supraventricular tachycardia) (HCC) 12/14/2017    Patient Active Problem List   Diagnosis Date Noted  . AAA (abdominal aortic aneurysm) without rupture (HCC) 12/15/2017  . Abnormal [place] (ultrasound) of abdomen   . SVT (supraventricular tachycardia) (HCC) 12/14/2017  . AKI (acute kidney injury) (HCC) 12/14/2017  . HTN (hypertension) 12/14/2017  . Elevated troponin 12/14/2017    Past Surgical History:  Procedure Laterality Date  . ABDOMINAL SURGERY    . SHOULDER SURGERY Left   . SHOULDER SURGERY          Home Medications    Prior to Admission medications   Medication Sig Start Date End Date Taking? Authorizing Provider  amLODipine (NORVASC) 5 MG tablet Take 1 tablet (5 mg total) by mouth daily. 01/08/18 04/08/18 Yes Laqueta Linden, MD  aspirin EC 81 MG EC tablet Take 1 tablet (81 mg total) by mouth daily. 12/16/17  Yes Johnson, Clanford L, MD  docusate sodium (COLACE) 100 MG capsule Take 1 capsule (100 mg total) by mouth 2 (two) times daily as needed for mild constipation. 12/15/17  Yes Johnson, Clanford L, MD  ibuprofen (ADVIL,MOTRIN) 200 MG tablet Take 400 mg by mouth every 6 (six) hours as needed.   Yes [provider]  metoprolol tartrate 75 MG TABS Take 75  mg by mouth 2 (two) times daily. 01/11/18   Loren Racer, MD    Family History History reviewed. No pertinent family history.  Social History Social History   Tobacco Use  . Smoking status: Former Smoker    Types: Cigarettes    Last attempt to quit: 08/29/1993    Years since quitting: 24.3  . Smokeless tobacco: Never Used  Substance Use Topics  . Alcohol use: Yes    Comment: occasionally  . Drug use: Not Currently     Allergies   Patient has no known allergies.   Review of Systems Review of Systems  Constitutional: Negative for chills and fever.  Eyes: Negative for visual disturbance.  Respiratory: Positive for chest tightness. Negative for shortness of breath.   Cardiovascular: Positive for palpitations and leg swelling. Negative for chest pain.  Gastrointestinal: Negative for abdominal pain, nausea and vomiting.  Musculoskeletal: Positive for neck pain. Negative for back pain.  Skin: Negative for rash and wound.  Neurological: Negative for weakness and numbness.  Psychiatric/Behavioral: The patient is nervous/anxious.   All other systems reviewed and are negative.    Physical Exam Updated Vital Signs BP (!) 126/92   Pulse 63   Temp 97.8 F (36.6 C) (Oral)   Resp 12   Ht  (1.651 m)   Wt 99.8 kg (220 lb)   SpO2 95%   BMI 36.61 kg/m   Physical Exam  Constitutional: He is oriented  to person, place, and time. He appears well-developed and well-nourished. He appears distressed.  HENT:  Head: Normocephalic and atraumatic.  Mouth/Throat: Oropharynx is clear and moist. No oropharyngeal exudate.  Eyes: Pupils are equal, round, and reactive to light. EOM are normal.  Neck: Normal range of motion. Neck supple.  Cardiovascular: Regular rhythm.  Tachycardia  Pulmonary/Chest: Effort normal and breath sounds normal.  Abdominal: Soft. Bowel sounds are normal. There is no tenderness. There is no rebound and no guarding.  Musculoskeletal: Normal range of motion.  He exhibits edema. He exhibits no tenderness.  1+ bilateral lower extremity pitting edema.  Distal pulses intact.  Lymphadenopathy:    He has no cervical adenopathy.  Neurological: He is alert and oriented to person, place, and time.  Moves all extremities without focal deficit.  Sensation intact.  Skin: Skin is warm and dry. Capillary refill takes less than 2 seconds. No rash noted. No erythema.  Psychiatric: He has a normal mood and affect. His behavior is normal.  Nursing note and vitals reviewed.    ED Treatments / Results  Labs (all labs ordered are listed, but only abnormal results are displayed) Labs Reviewed  CBC WITH DIFFERENTIAL/PLATELET - Abnormal; Notable for the following components:      Result Value   Hemoglobin 17.5 (*)    All other components within normal limits  COMPREHENSIVE METABOLIC PANEL - Abnormal; Notable for the following components:   Chloride 100 (*)    Glucose, Bld 142 (*)    All other components within normal limits  TROPONIN I  MAGNESIUM    EKG EKG Interpretation  Date/Time:  Thursday Jan 11 2018 08:15:20 EDT Ventricular Rate:  176 PR Interval:    QRS Duration: 101 QT Interval:  287 QTC Calculation: 492 R Axis:   -57 Text Interpretation:  Supraventricular tachycardia LAD, consider left anterior fascicular block Abnormal R-wave progression, late transition ST depression, probably rate related Confirmed by Loren Racer ([id number]) on 01/11/2018 8:35:22 AM   Radiology Dg Chest Port 1 View  Result Date: 01/11/2018 CLINICAL DATA:  Chest pain EXAM: PORTABLE CHEST 1 VIEW COMPARISON:  12/29/2017 FINDINGS: There is mild bilateral interstitial thickening. There is no focal parenchymal opacity. There is no pleural effusion or pneumothorax. There is stable cardiomegaly. There is mild thoracic aortic atherosclerosis. The osseous structures are unremarkable. IMPRESSION: No active disease. Stable cardiomegaly. Electronically Signed   By: Elige Ko   On:  01/11/2018 09:10    Procedures Procedures (including critical care time)  Medications Ordered in ED Medications  adenosine (ADENOCARD) 6 MG/2ML injection (12 mg  Given 01/11/18 0823)  potassium chloride SA (K-DUR,KLOR-CON) CR tablet 40 mEq (40 mEq Oral Given 01/11/18 1041)   CRITICAL CARE Performed by: Loren Racer Total critical care time: 35 minutes Critical care time was exclusive of separately billable procedures and treating other patients. Critical care was necessary to treat or prevent imminent or life-threatening deterioration. Critical care was time spent personally by me on the following activities: development of treatment plan with patient and/or surrogate as well as nursing, discussions with consultants, evaluation of patient's response to treatment, examination of patient, obtaining history from patient or surrogate, ordering and performing treatments and interventions, ordering and review of laboratory studies, ordering and review of radiographic studies, pulse oximetry and re-evaluation of patient's condition.  Initial Impression / Assessment and Plan / ED Course  I have reviewed the triage vital signs and the nursing notes.  Pertinent labs & imaging results that were available during my care  of the patient were reviewed by me and considered in my medical decision making (see chart for details).    Patient is previously responded to 12 mg of IV adenosine.  Given 12 mg in the emergency department with conversion to normal sinus rhythm.  Patient states that the chest discomfort has improved.  Discussed with Dr. Wyline Mood.  Recommends increasing patient's metoprolol to 75 mg twice daily and will admission to Dr. Purvis Sheffield to arrange expedited follow-up.  Does not believe that the troponin needs to be repeated.  Have explained to patient and patient's wife.  Both expressed understanding of amount of medication to take, need to follow-up and return precautions.  Final Clinical  Impressions(s) / ED Diagnoses   Final diagnoses:  SVT (supraventricular tachycardia) [facility])    ED Discharge Orders        Ordered    metoprolol tartrate 75 MG TABS  2 times daily     01/11/18 1048       Loren Racer, MD 01/11/18 1048

## 2018-01-11 NOTE — Progress Notes (Signed)
Patient discussed with ER staff, history of SVT presents again with SVT and tachcyardia. Lopressor recently increased to  bid, will increase again to  bid, recommend replace K to 4. Ok for ER discharge, I will make Dr Purvis Sheffield aware.    Dina Rich MD

## 2018-01-15 ENCOUNTER — Encounter (HOSPITAL_BASED_OUTPATIENT_CLINIC_OR_DEPARTMENT_OTHER)
Admission: RE | Admit: 2018-01-15 | Discharge: 2018-01-15 | Disposition: A | Discharge status: Home / Self Care | Payer: Self-pay | Source: Ambulatory Visit | Attending: Cardiovascular Disease | Admitting: Cardiovascular Disease

## 2018-01-15 ENCOUNTER — Encounter (HOSPITAL_COMMUNITY): Payer: Self-pay

## 2018-01-15 ENCOUNTER — Encounter (HOSPITAL_COMMUNITY)
Admission: RE | Admit: 2018-01-15 | Discharge: 2018-01-15 | Disposition: A | Discharge status: Home / Self Care | Payer: Self-pay | Source: Ambulatory Visit | Attending: Cardiovascular Disease | Admitting: Cardiovascular Disease

## 2018-01-15 DIAGNOSIS — R079 Chest pain, unspecified: Secondary | ICD-10-CM | POA: Insufficient documentation

## 2018-01-15 LAB — NM MYOCAR MULTI W/SPECT W/WALL MOTION / EF
CHL CUP NUCLEAR SDS: 1
CHL CUP NUCLEAR SRS: 0
CHL RATE OF PERCEIVED EXERTION: 13
CSEPEW: 7 METS
CSEPPHR: 144 {beats}/min
Exercise duration (min): 6 min
Exercise duration (sec): 50 s
LV dias vol: 88 mL (ref 62–150)
LVSYSVOL: 33 mL
MPHR: 1158 {beats}/min
Percent HR: 91 %
RATE: 0.51
Rest HR: 53 {beats}/min
SSS: 1
TID: 1.09

## 2018-01-15 MED ORDER — REGADENOSON 0.4 MG/5ML IV SOLN
INTRAVENOUS | Status: AC
  Filled 2018-01-15: qty 5

## 2018-01-15 MED ORDER — TECHNETIUM TC 99M TETROFOSMIN IV KIT
10.0000 | PACK | Freq: Once | INTRAVENOUS | Status: AC | PRN
  Administered 2018-01-15: 10 via INTRAVENOUS

## 2018-01-15 MED ORDER — TECHNETIUM TC 99M TETROFOSMIN IV KIT
30.0000 | PACK | Freq: Once | INTRAVENOUS | Status: AC | PRN
  Administered 2018-01-15: 30 via INTRAVENOUS

## 2018-01-15 MED ORDER — SODIUM CHLORIDE 0.9% FLUSH
INTRAVENOUS | Status: AC
  Administered 2018-01-15: 10 mL via INTRAVENOUS
  Filled 2018-01-15: qty 10

## 2018-01-23 ENCOUNTER — Telehealth: Payer: Self-pay | Admitting: *Deleted

## 2018-01-23 NOTE — Telephone Encounter (Signed)
Correction, no pmd currently.

## 2018-01-23 NOTE — Telephone Encounter (Signed)
Notes recorded by Lesle Chris, LPN on 1/61/0960 at 2:18 PM EDT Patient notified. Copy to pmd. Follow up scheduled for 01/26/2018 with Randall An in [place] office.   ------  Notes recorded by Lesle Chris, LPN on 4/54/0981 at 3:00 PM EDT Left message to return call.  ------  Notes recorded by Laqueta Linden, MD on 01/15/2018 at 12:15 PM EDT Low risk for blockages. I will discuss further at fu ov.

## 2018-01-25 NOTE — Progress Notes (Signed)
Cardiology Office Note    Date:  01/26/2018   ID:  Jason Sweeney, DOB 08-12-1956, MRN [id number]  PCP:  Patient, No Pcp Per  Cardiologist: Prentice Docker, MD    Chief Complaint  Patient presents with  . Follow-up    Recurrent Emergency Dept visits for SVT    History of Present Illness:    Jason Sweeney is a 62 y.o. male with past medical history of HTN and SVT who presents to the office today for follow-up from a recent Emergency Department visit.  He was recently admitted to [facility] in 11/2017 for evaluation of palpitations and was found to have SVT. He converted back to normal sinus rhythm with administration of IV Adenosine. He was found to be hypokalemic at the time of admission and this was replaced. Echocardiogram showed a preserved EF of 60 to 65% with no regional wall motion abnormalities. Was found to have a distal aortic aneurysm measuring 4.7 cm and associated with mural thrombus. Outpatient referral to vascular surgery was recommended (now being followed by Dr. Edilia Bo).   He presented back to the ED on 12/29/2017 and was found to have recurrent SVT with a heart rate in the 150's.  Adenosine was again administered and he converted back to normal sinus rhythm. Metoprolol was increased from 25 mg twice daily to 50 mg twice daily. At the time of follow-up with Dr. Purvis Sheffield on 01/08/2018, he was overall doing well on his current dose of Lopressor and it was recommended that if his symptoms recurred, could consider EP referral for ablation. Due to his recent episodes of chest discomfort and elevated troponins, a NST was recommended for ischemic evaluation. This showed no evidence of ischemia and was overall a low risk study.  He again presented back to the ED 3 days after his visit for recurrent palpitations which started earlier that morning. He was found to be in SVT again and converted back to normal sinus rhythm with administration of Adenosine. Lopressor was further titrated  to 75 mg twice daily and follow-up was arranged.  In talking with the patient today, he reports overall doing well since his last ED visit.  He does have occasional episodes of his heart skipping but denies any recurrent palpitations like what brought him to the ED two weeks ago. He reports good compliance with his medication regimen and denies any missing any recent doses of Metoprolol. Denies any associated lightheadedness, dizziness, or presyncope with his recent medication changes. He had been experiencing a burning sensation along his sternum but says this improves with the use of Tums. Recent NST was reviewed with the patient and his family.   An interpreter from [employer] was present and assisted during this encounter.    Past Medical History:  Diagnosis Date  . AKI (acute kidney injury) (HCC) 12/14/2017  . HTN (hypertension) 12/14/2017  . SVT (supraventricular tachycardia) (HCC) 12/14/2017    Past Surgical History:  Procedure Laterality Date  . ABDOMINAL SURGERY    . SHOULDER SURGERY Left   . SHOULDER SURGERY      Current Medications: Outpatient Medications Prior to Visit  Medication Sig Dispense Refill  . amLODipine (NORVASC) 5 MG tablet Take 1 tablet (5 mg total) by mouth daily. 30 tablet 6  . aspirin EC 81 MG EC tablet Take 1 tablet (81 mg total) by mouth daily.    Marland Kitchen docusate sodium (COLACE) 100 MG capsule Take 1 capsule (100 mg total) by mouth 2 (two) times daily as needed  for mild constipation. 30 capsule 0  . ibuprofen (ADVIL,MOTRIN) 200 MG tablet Take 400 mg by mouth every 6 (six) hours as needed.    . metoprolol tartrate 75 MG TABS Take 75 mg by mouth 2 (two) times daily. 60 tablet 0   No facility-administered medications prior to visit.      Allergies:   Patient has no known allergies.   Social History   Socioeconomic History  . Marital status: Married    Spouse name: Not on file  . Number of children: Not on file  . Years of education: Not on file  .  Highest education level: Not on file  Occupational History  . Not on file  Social Needs  . Financial resource strain: Not on file  . Food insecurity:    Worry: Not on file    Inability: Not on file  . Transportation needs:    Medical: Not on file    Non-medical: Not on file  Tobacco Use  . Smoking status: Former Smoker    Types: Cigarettes    Last attempt to quit: 08/29/1993    Years since quitting: 24.4  . Smokeless tobacco: Never Used  Substance and Sexual Activity  . Alcohol use: Yes    Comment: occasionally  . Drug use: Not Currently  . Sexual activity: Not on file  Lifestyle  . Physical activity:    Days per week: Not on file    Minutes per session: Not on file  . Stress: Not on file  Relationships  . Social connections:    Talks on phone: Not on file    Gets together: Not on file    Attends religious service: Not on file    Active member of club or organization: Not on file    Attends meetings of clubs or organizations: Not on file    Relationship status: Not on file  Other Topics Concern  . Not on file  Social History Narrative  . Not on file     Family History:  The patient's family history is not on file.   Review of Systems:   Please see the history of present illness.     General:  No chills, fever, night sweats or weight changes.  Cardiovascular:  No dyspnea on exertion, edema, orthopnea,  paroxysmal nocturnal dyspnea. Positive for chest pain and palpitations.  Dermatological: No rash, lesions/masses Respiratory: No cough, dyspnea Urologic: No hematuria, dysuria Abdominal:   No nausea, vomiting, diarrhea, bright red blood per rectum, melena, or hematemesis Neurologic:  No visual changes, wkns, changes in mental status. All other systems reviewed and are otherwise negative except as noted above.   Physical Exam:    VS:  BP (!) 144/88   Pulse 66   Ht  (1.651 m)   Wt 228 lb (103.4 kg)   SpO2 96%   BMI 37.94 kg/m    General: Well developed,  well nourished,male appearing in no acute distress. Head: Normocephalic, atraumatic, sclera non-icteric, no xanthomas, nares are without discharge.  Neck: No carotid bruits. JVD not elevated.  Lungs: Respirations regular and unlabored, without wheezes or rales.  Heart: Regular rate and rhythm. No S3 or S4.  No murmur, no rubs, or gallops appreciated. Abdomen: Soft, non-tender, non-distended with normoactive bowel sounds. No hepatomegaly. No rebound/guarding. No obvious abdominal masses. Msk:  Strength and tone appear normal for age. No joint deformities or effusions. Extremities: No clubbing or cyanosis. No lower extremity edema.  Distal pedal pulses are 2+ bilaterally.  Neuro: Alert and oriented X 3. Moves all extremities spontaneously. No focal deficits noted. Psych:  Responds to questions appropriately with a normal affect. Skin: No rashes or lesions noted  Wt Readings from Last 3 Encounters:  01/26/18 228 lb (103.4 kg)  01/11/18 220 lb (99.8 kg)  01/08/18 226 lb (102.5 kg)     Studies/Labs Reviewed:   EKG:  EKG is not ordered today. EKG from 01/11/2018 is reviewed and shows NSR, HR 94, with ST abnormalities along inferior leads (similar to prior tracings).   Recent Labs: 12/15/2017: TSH 0.480 01/11/2018: ALT 39; BUN 14; Creatinine, Ser 0.96; Hemoglobin 17.5; Magnesium 1.9; Platelets 237; Potassium 3.6; Sodium 136   Lipid Panel    Component Value Date/Time   CHOL 135 12/15/2017 0346   TRIG 134 12/15/2017 0346   HDL 31 (L) 12/15/2017 0346   CHOLHDL 4.4 12/15/2017 0346   VLDL 27 12/15/2017 0346   LDLCALC 77 12/15/2017 0346    Additional studies/ records that were reviewed today include:   Echocardiogram: 12/15/2017 Study Conclusions  - Left ventricle: The cavity size was normal. Wall thickness was   increased in a pattern of moderate to severe LVH. Systolic   function was normal. The estimated ejection fraction was in the   range of 60% to 65%. Wall motion was normal; there  were no   regional wall motion abnormalities. Left ventricular diastolic   function parameters were normal. - Aortic valve: Valve area (VTI): 2.64 cm^2. Valve area (Vmax):   2.51 cm^2. Valve area (Vmean): 2.43 cm^2. - Technically adequate study.  NST: 01/15/2018  Blood pressure demonstrated a hypertensive response to exercise.  Horizontal ST segment depression ST segment depression of 1 mm was noted during stress in the II leads, and returning to baseline after less than 1 minute of recovery. There were initial TWI's in III. There were 0.5 mm horizontal ST depressions in III and aVF.  Defect 1: There is a medium defect of mild severity present in the mid inferior and mid inferolateral location. This appears to be due to soft tissue attenuation as regional wall motion is normal.  This is a low risk study based on perfusion imaging.  Nuclear stress EF: 62%.  Assessment:    1. SVT (supraventricular tachycardia) (HCC)   2. Essential hypertension   3. AAA (abdominal aortic aneurysm) without rupture (HCC)      Plan:   In order of problems listed above:  1. Recurrent SVT - He was initially admitted for SVT in 11/2017 and has subsequently been evaluated in the emergency department twice for recurrent symptoms. He has experienced return to normal sinus rhythm with administration of Adenosine during all 3 occurrences. He was hypokalemic at the time of his initial presentation but with his recurrent episodes, electrolytes have been within normal limits. TSH low-end of normal ay 0.48. No significant alcohol or caffeine intake  - Reports intermittent palpitations since his last visit but denies any recurrent persistent symptoms.  BP is slightly elevated at 144/88 during today's visit and heart rate is in the 60's, therefore will further titrate Metoprolol from 75 mg twice daily to 100 mg twice daily. As previously recommended by Dr. Purvis Sheffield, will refer to EP for consideration of ablation given  that this has led to admissions and recurrent ED visits. The patient and his family are willing to travel to [facility] if they can be evaluated at an earlier date as the patient is very anxious about when his next episode might occur.  2. HTN - BP is slightly elevated at 144/88 during today's visit. - Continue Amlodipine 5 mg daily and will further titrate Metoprolol to 100 mg twice daily as outlined above.  3. AAA - recent imaging showed a distal aortic aneurysm measuring 4.7 cm. - being followed by Dr. Edilia Bo with Vascular Surgery.   Medication Adjustments/Labs and Tests Ordered: Current medicines are reviewed at length with the patient today.  Concerns regarding medicines are outlined above.  Medication changes, Labs and Tests ordered today are listed in the Patient Instructions below. Patient Instructions  Medication Instructions:  Your physician has recommended you make the following change in your medication:  Increase Lopressor to 100 mg Two Times Daily   Labwork: NONE   Testing/Procedures: NONE   Follow-Up: Your physician recommends that you schedule a follow-up appointment with Dr. Purvis Sheffield as planned.   You have been referred to Electrophysiology   Any Other Special Instructions Will Be Listed Below (If Applicable).   If you need a refill on your cardiac medications before your next appointment, please call your pharmacy.  Thank you for choosing [facility]!    Signed, Ellsworth Lennox, PA-C  01/26/2018 4:58 PM    [facility] [address] [place], [place] [address] Phone: [phone]

## 2018-01-26 ENCOUNTER — Ambulatory Visit (INDEPENDENT_AMBULATORY_CARE_PROVIDER_SITE_OTHER): Payer: Self-pay | Admitting: Student

## 2018-01-26 ENCOUNTER — Encounter: Payer: Self-pay | Admitting: Student

## 2018-01-26 VITALS — BP 144/88 | HR 66 | Ht 65.0 in | Wt 228.0 lb

## 2018-01-26 DIAGNOSIS — I714 Abdominal aortic aneurysm, without rupture, unspecified: Secondary | ICD-10-CM

## 2018-01-26 DIAGNOSIS — I10 Essential (primary) hypertension: Secondary | ICD-10-CM

## 2018-01-26 DIAGNOSIS — I471 Supraventricular tachycardia: Secondary | ICD-10-CM

## 2018-01-26 MED ORDER — METOPROLOL TARTRATE 100 MG PO TABS: 100.0000 mg | ORAL_TABLET | Freq: Two times a day (BID) | ORAL | 3 refills | Status: DC

## 2018-01-26 NOTE — Patient Instructions (Signed)
Medication Instructions:  Your physician has recommended you make the following change in your medication:  Increase Lopressor to 100 mg Two Times Daily    Labwork: NONE   Testing/Procedures: NONE   Follow-Up: Your physician recommends that you schedule a follow-up appointment with Dr. Purvis SheffieldKoneswaran as planned.   You have been referred to Electrophysiology    Any Other Special Instructions Will Be Listed Below (If Applicable).     If you need a refill on your cardiac medications before your next appointment, please call your pharmacy.  Thank you for choosing [facility]!

## 2018-02-01 ENCOUNTER — Encounter: Payer: Self-pay | Admitting: Internal Medicine

## 2018-02-01 ENCOUNTER — Ambulatory Visit (INDEPENDENT_AMBULATORY_CARE_PROVIDER_SITE_OTHER): Payer: Self-pay | Admitting: Internal Medicine

## 2018-02-01 VITALS — BP 136/74 | HR 54 | Ht 65.0 in | Wt 228.0 lb

## 2018-02-01 DIAGNOSIS — I471 Supraventricular tachycardia: Secondary | ICD-10-CM

## 2018-02-01 MED ORDER — METOPROLOL TARTRATE 100 MG PO TABS: 100.0000 mg | ORAL_TABLET | Freq: Two times a day (BID) | ORAL | 3 refills | Status: DC

## 2018-02-01 MED ORDER — AMLODIPINE BESYLATE 5 MG PO TABS: 5.0000 mg | ORAL_TABLET | Freq: Every day | ORAL | 3 refills | Status: DC

## 2018-02-01 MED ORDER — AMLODIPINE BESYLATE 5 MG PO TABS: 5.0000 mg | ORAL_TABLET | Freq: Every day | ORAL | 6 refills | Status: DC

## 2018-02-01 NOTE — Patient Instructions (Signed)
Medication Instructions:  Your physician recommends that you continue on your current medications as directed. Please refer to the Current Medication list given to you today.  Labwork: None ordered.  Testing/Procedures: Your physician has recommended that you have an ablation. Catheter ablation is a medical procedure used to treat some cardiac arrhythmias (irregular heartbeats). During catheter ablation, a long, thin, flexible tube is put into a blood vessel in your groin (upper thigh), or neck. This tube is called an ablation catheter. It is then guided to your heart through the blood vessel. Radio frequency waves destroy small areas of heart tissue where abnormal heartbeats may cause an arrhythmia to start. Please see the instruction sheet given to you today.  Follow-Up:  You will follow up with Dr. Ladona Ridgelaylor 4 weeks after your procedure.  Any Other Special Instructions Will Be Listed Below (If Applicable).  [facility] at: 5:30 am on February 09, 2018 Do not eat or drink after midnight prior to procedure Do not take any medications the morning of the procedure Do NOT take your metoprolol for 2 days prior to your procedure.  Your last dose will be February 06, 2018 your evening dose You will be discharged after you are stabilized You will need someone to drive you home at discharge  If you need a refill on your cardiac medications before your next appointment, please call your pharmacy.   Cardiac Ablation Cardiac ablation is a procedure to disable (ablate) a small amount of heart tissue in very specific places. The heart has many electrical connections. Sometimes these connections are abnormal and can cause the heart to beat very fast or irregularly. Ablating some of the problem areas can improve the heart rhythm or return it to normal. Ablation may be done for people who:  Have Wolff-Parkinson-White syndrome.  Have fast heart rhythms  (tachycardia).  Have taken medicines for an abnormal heart rhythm (arrhythmia) that were not effective or caused side effects.  Have a high-risk heartbeat that may be life-threatening.  During the procedure, a small incision is made in the neck or the groin, and a long, thin, flexible tube (catheter) is inserted into the incision and moved to the heart. Small devices (electrodes) on the tip of the catheter will send out electrical currents. A type of X-ray (fluoroscopy) will be used to help guide the catheter and to provide images of the heart. Tell a health care provider about:  Any allergies you have.  All medicines you are taking, including vitamins, herbs, eye drops, creams, and over-the-counter medicines.  Any problems you or family members have had with anesthetic medicines.  Any blood disorders you have.  Any surgeries you have had.  Any medical conditions you have, such as kidney failure.  Whether you are pregnant or may be pregnant. What are the risks? Generally, this is a safe procedure. However, problems may occur, including:  Infection.  Bruising and bleeding at the catheter insertion site.  Bleeding into the chest, especially into the sac that surrounds the heart. This is a serious complication.  Stroke or blood clots.  Damage to other structures or organs.  Allergic reaction to medicines or dyes.  Need for a permanent pacemaker if the normal electrical system is damaged. A pacemaker is a small computer that sends electrical signals to the heart and helps your heart beat normally.  The procedure not being fully effective. This may not be recognized until months later. Repeat ablation procedures  are sometimes required.  What happens before the procedure?  Follow instructions from your health care provider about eating or drinking restrictions.  Ask your health care provider about: ? Changing or stopping your regular medicines. This is especially important if  you are taking diabetes medicines or blood thinners. ? Taking medicines such as aspirin and ibuprofen. These medicines can thin your blood. Do not take these medicines before your procedure if your health care provider instructs you not to.  Plan to have someone take you home from the hospital or clinic.  If you will be going home right after the procedure, plan to have someone with you for 24 hours. What happens during the procedure?  To lower your risk of infection: ? Your health care team will wash or sanitize their hands. ? Your skin will be washed with soap. ? Hair may be removed from the incision area.  An IV tube will be inserted into one of your veins.  You will be given a medicine to help you relax (sedative).  The skin on your neck or groin will be numbed.  An incision will be made in your neck or your groin.  A needle will be inserted through the incision and into a large vein in your neck or groin.  A catheter will be inserted into the needle and moved to your heart.  Dye may be injected through the catheter to help your surgeon see the area of the heart that needs treatment.  Electrical currents will be sent from the catheter to ablate heart tissue in desired areas. There are three types of energy that may be used to ablate heart tissue: ? Heat (radiofrequency energy). ? Laser energy. ? Extreme cold (cryoablation).  When the necessary tissue has been ablated, the catheter will be removed.  Pressure will be held on the catheter insertion area to prevent excessive bleeding.  A bandage (dressing) will be placed over the catheter insertion area. The procedure may vary among health care providers and hospitals. What happens after the procedure?  Your blood pressure, heart rate, breathing rate, and blood oxygen level will be monitored until the medicines you were given have worn off.  Your catheter insertion area will be monitored for bleeding. You will need to lie  still for a few hours to ensure that you do not bleed from the catheter insertion area.  Do not drive for 24 hours or as long as directed by your health care provider. Summary  Cardiac ablation is a procedure to disable (ablate) a small amount of heart tissue in very specific places. Ablating some of the problem areas can improve the heart rhythm or return it to normal.  During the procedure, electrical currents will be sent from the catheter to ablate heart tissue in desired areas. This information is not intended to replace advice given to you by your health care provider. Make sure you discuss any questions you have with your health care provider. Document Released: 01/01/2009 Document Revised: 07/04/2016 Document Reviewed: 07/04/2016 Elsevier Interactive Patient Education  [employer].

## 2018-02-01 NOTE — Progress Notes (Signed)
HPI [name] is referred today by Jason Sweeney for evaluation of SVT. He is a pleasant 62 yo man with HTN who has had recurrent SVT treated on multiple occaisions for over 3 years. In the interim, he has been started on medical therapy and his symptoms are improved.  No Known Allergies   Current Outpatient Medications  Medication Sig Dispense Refill  . amLODipine (NORVASC) 5 MG tablet Take 1 tablet (5 mg total) by mouth daily. 90 tablet 3  . aspirin EC 81 MG EC tablet Take 1 tablet (81 mg total) by mouth daily.    Marland Kitchen. docusate sodium (COLACE) 100 MG capsule Take 1 capsule (100 mg total) by mouth 2 (two) times daily as needed for mild constipation. 30 capsule 0  . ibuprofen (ADVIL,MOTRIN) 200 MG tablet Take 400 mg by mouth every 6 (six) hours as needed.    . metoprolol tartrate (LOPRESSOR) 100 MG tablet Take 1 tablet (100 mg total) by mouth 2 (two) times daily. 180 tablet 3   No current facility-administered medications for this visit.      Past Medical History:  Diagnosis Date  . AKI (acute kidney injury) (HCC) 12/14/2017  . HTN (hypertension) 12/14/2017  . SVT (supraventricular tachycardia) (HCC) 12/14/2017    ROS:   All systems reviewed and negative except as noted in the HPI.   Past Surgical History:  Procedure Laterality Date  . ABDOMINAL SURGERY    . SHOULDER SURGERY Left   . SHOULDER SURGERY       No family history on file.   Social History   Socioeconomic History  . Marital status: Married    Spouse name: Not on file  . Number of children: Not on file  . Years of education: Not on file  . Highest education level: Not on file  Occupational History  . Not on file  Social Needs  . Financial resource strain: Not on file  . Food insecurity:    Worry: Not on file    Inability: Not on file  . Transportation needs:    Medical: Not on file    Non-medical: Not on file  Tobacco Use  . Smoking status: Former Smoker    Types: Cigarettes    Last  attempt to quit: 08/29/1993    Years since quitting: 24.4  . Smokeless tobacco: Never Used  Substance and Sexual Activity  . Alcohol use: Yes    Comment: occasionally  . Drug use: Not Currently  . Sexual activity: Not on file  Lifestyle  . Physical activity:    Days per week: Not on file    Minutes per session: Not on file  . Stress: Not on file  Relationships  . Social connections:    Talks on phone: Not on file    Gets together: Not on file    Attends religious service: Not on file    Active member of club or organization: Not on file    Attends meetings of clubs or organizations: Not on file    Relationship status: Not on file  . Intimate partner violence:    Fear of current or ex partner: Not on file    Emotionally abused: Not on file    Physically abused: Not on file    Forced sexual activity: Not on file  Other Topics Concern  . Not on file  Social History Narrative  . Not on file     BP 136/74   Pulse (!) 54  Ht 5\' 5"  (1.651 m)   Wt 228 lb (103.4 kg)   BMI 37.94 kg/m   Physical Exam:  Well appearing 61 man, NAD HEENT: Unremarkable Neck:  6 cm JVD, no thyromegally Lymphatics:  No adenopathy Back:  No CVA tenderness Lungs:  Clear with no wheezes HEART:  Regular rate rhythm, no murmurs, no rubs, no clicks Abd:  soft, positive bowel sounds, no organomegally, no rebound, no guarding Ext:  2 plus pulses, no edema, no cyanosis, no clubbing Skin:  No rashes no nodules Neuro:  CN II through XII intact, motor grossly intact  EKG - sinus bradycardia    Assess/Plan: 1. Recurrent medically refractory SVT - I have discussed the indications/risks/benefits/goals/expectations of EP study and catheter ablation with the patient by way of the spanish interpreter and he wishes to proceed.  2. 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 No indication for additional evaluation for now.  Jason Sweeney.D.

## 2018-02-01 NOTE — H&P (View-Only) (Signed)
HPI [name] is referred today by Randall AnBrittany Strader for evaluation of SVT. He is a pleasant 62 yo man with HTN who has had recurrent SVT treated on multiple occaisions for over 3 years. In the interim, he has been started on medical therapy and his symptoms are improved.  No Known Allergies   Current Outpatient Medications  Medication Sig Dispense Refill  . amLODipine (NORVASC) 5 MG tablet Take 1 tablet (5 mg total) by mouth daily. 90 tablet 3  . aspirin EC 81 MG EC tablet Take 1 tablet (81 mg total) by mouth daily.    Marland Kitchen. docusate sodium (COLACE) 100 MG capsule Take 1 capsule (100 mg total) by mouth 2 (two) times daily as needed for mild constipation. 30 capsule 0  . ibuprofen (ADVIL,MOTRIN) 200 MG tablet Take 400 mg by mouth every 6 (six) hours as needed.    . metoprolol tartrate (LOPRESSOR) 100 MG tablet Take 1 tablet (100 mg total) by mouth 2 (two) times daily. 180 tablet 3   No current facility-administered medications for this visit.      Past Medical History:  Diagnosis Date  . AKI (acute kidney injury) (HCC) 12/14/2017  . HTN (hypertension) 12/14/2017  . SVT (supraventricular tachycardia) (HCC) 12/14/2017    ROS:   All systems reviewed and negative except as noted in the HPI.   Past Surgical History:  Procedure Laterality Date  . ABDOMINAL SURGERY    . SHOULDER SURGERY Left   . SHOULDER SURGERY       No family history on file.   Social History   Socioeconomic History  . Marital status: Married    Spouse name: Not on file  . Number of children: Not on file  . Years of education: Not on file  . Highest education level: Not on file  Occupational History  . Not on file  Social Needs  . Financial resource strain: Not on file  . Food insecurity:    Worry: Not on file    Inability: Not on file  . Transportation needs:    Medical: Not on file    Non-medical: Not on file  Tobacco Use  . Smoking status: Former Smoker    Types: Cigarettes    Last  attempt to quit: 08/29/1993    Years since quitting: 24.4  . Smokeless tobacco: Never Used  Substance and Sexual Activity  . Alcohol use: Yes    Comment: occasionally  . Drug use: Not Currently  . Sexual activity: Not on file  Lifestyle  . Physical activity:    Days per week: Not on file    Minutes per session: Not on file  . Stress: Not on file  Relationships  . Social connections:    Talks on phone: Not on file    Gets together: Not on file    Attends religious service: Not on file    Active member of club or organization: Not on file    Attends meetings of clubs or organizations: Not on file    Relationship status: Not on file  . Intimate partner violence:    Fear of current or ex partner: Not on file    Emotionally abused: Not on file    Physically abused: Not on file    Forced sexual activity: Not on file  Other Topics Concern  . Not on file  Social History Narrative  . Not on file     BP 136/74   Pulse (!) 54  Ht 5\' 5"  (1.651 m)   Wt 228 lb (103.4 kg)   BMI 37.94 kg/m   Physical Exam:  Well appearing 61 man, NAD HEENT: Unremarkable Neck:  6 cm JVD, no thyromegally Lymphatics:  No adenopathy Back:  No CVA tenderness Lungs:  Clear with no wheezes HEART:  Regular rate rhythm, no murmurs, no rubs, no clicks Abd:  soft, positive bowel sounds, no organomegally, no rebound, no guarding Ext:  2 plus pulses, no edema, no cyanosis, no clubbing Skin:  No rashes no nodules Neuro:  CN II through XII intact, motor grossly intact  EKG - sinus bradycardia    Assess/Plan: 1. Recurrent medically refractory SVT - I have discussed the indications/risks/benefits/goals/expectations of EP study and catheter ablation with the patient by way of the spanish interpreter and he wishes to proceed.  2. 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 No indication for additional evaluation for now.  Leonia Reeves.D.

## 2018-02-08 ENCOUNTER — Other Ambulatory Visit: Payer: Self-pay

## 2018-02-08 DIAGNOSIS — I714 Abdominal aortic aneurysm, without rupture, unspecified: Secondary | ICD-10-CM

## 2018-02-09 ENCOUNTER — Encounter (HOSPITAL_COMMUNITY)
Admission: RE | Disposition: A | Discharge status: Home / Self Care | Payer: Self-pay | Source: Ambulatory Visit | Attending: Internal Medicine

## 2018-02-09 ENCOUNTER — Encounter: Payer: Self-pay | Admitting: Cardiovascular Disease

## 2018-02-09 ENCOUNTER — Ambulatory Visit (HOSPITAL_COMMUNITY)
Admission: RE | Admit: 2018-02-09 | Discharge: 2018-02-09 | Disposition: A | Discharge status: Home / Self Care | Payer: Self-pay | Source: Ambulatory Visit | Attending: Internal Medicine | Admitting: Internal Medicine

## 2018-02-09 DIAGNOSIS — I471 Supraventricular tachycardia: Secondary | ICD-10-CM | POA: Insufficient documentation

## 2018-02-09 DIAGNOSIS — I252 Old myocardial infarction: Secondary | ICD-10-CM | POA: Insufficient documentation

## 2018-02-09 DIAGNOSIS — Z7982 Long term (current) use of aspirin: Secondary | ICD-10-CM | POA: Insufficient documentation

## 2018-02-09 DIAGNOSIS — Z87891 Personal history of nicotine dependence: Secondary | ICD-10-CM | POA: Insufficient documentation

## 2018-02-09 DIAGNOSIS — I10 Essential (primary) hypertension: Secondary | ICD-10-CM | POA: Insufficient documentation

## 2018-02-09 HISTORY — PX: SVT ABLATION: EP1225

## 2018-02-09 SURGERY — SVT ABLATION

## 2018-02-09 MED ORDER — SODIUM CHLORIDE 0.9 % IV SOLN
INTRAVENOUS | Status: DC
  Administered 2018-02-09: 06:00:00 via INTRAVENOUS

## 2018-02-09 MED ORDER — MIDAZOLAM HCL 5 MG/5ML IJ SOLN
INTRAMUSCULAR | Status: DC | PRN
  Administered 2018-02-09 (×3): 1 mg via INTRAVENOUS
  Administered 2018-02-09: 2 mg via INTRAVENOUS
  Administered 2018-02-09: 1 mg via INTRAVENOUS

## 2018-02-09 MED ORDER — HEPARIN (PORCINE) IN NACL 2-0.9 UNITS/ML
INTRAMUSCULAR | Status: AC | PRN
  Administered 2018-02-09: 500 mL

## 2018-02-09 MED ORDER — SODIUM CHLORIDE 0.9% FLUSH: 3.0000 mL | INTRAVENOUS | Status: DC | PRN

## 2018-02-09 MED ORDER — ACETAMINOPHEN 325 MG PO TABS
650.0000 mg | ORAL_TABLET | ORAL | Status: DC | PRN
  Administered 2018-02-09: 650 mg via ORAL
  Filled 2018-02-09: qty 2

## 2018-02-09 MED ORDER — BUPIVACAINE HCL (PF) 0.25 % IJ SOLN
INTRAMUSCULAR | Status: AC
  Filled 2018-02-09: qty 60

## 2018-02-09 MED ORDER — SODIUM CHLORIDE 0.9 % IV SOLN: 250.0000 mL | INTRAVENOUS | Status: DC | PRN

## 2018-02-09 MED ORDER — HEPARIN (PORCINE) IN NACL 1000-0.9 UT/500ML-% IV SOLN
INTRAVENOUS | Status: AC
  Filled 2018-02-09: qty 500

## 2018-02-09 MED ORDER — MIDAZOLAM HCL 5 MG/5ML IJ SOLN
INTRAMUSCULAR | Status: AC
  Filled 2018-02-09: qty 5

## 2018-02-09 MED ORDER — SODIUM CHLORIDE 0.9% FLUSH: 3.0000 mL | Freq: Two times a day (BID) | INTRAVENOUS | Status: DC

## 2018-02-09 MED ORDER — FENTANYL CITRATE (PF) 100 MCG/2ML IJ SOLN
INTRAMUSCULAR | Status: AC
  Filled 2018-02-09: qty 2

## 2018-02-09 MED ORDER — BUPIVACAINE HCL (PF) 0.25 % IJ SOLN
INTRAMUSCULAR | Status: DC | PRN
  Administered 2018-02-09: 60 mL

## 2018-02-09 MED ORDER — ACETAMINOPHEN 325 MG PO TABS
ORAL_TABLET | ORAL | Status: AC
  Filled 2018-02-09: qty 2

## 2018-02-09 MED ORDER — ONDANSETRON HCL 4 MG/2ML IJ SOLN: 4.0000 mg | Freq: Four times a day (QID) | INTRAMUSCULAR | Status: DC | PRN

## 2018-02-09 MED ORDER — FENTANYL CITRATE (PF) 100 MCG/2ML IJ SOLN
INTRAMUSCULAR | Status: DC | PRN
  Administered 2018-02-09: 12.5 ug via INTRAVENOUS
  Administered 2018-02-09: 25 ug via INTRAVENOUS
  Administered 2018-02-09 (×3): 12.5 ug via INTRAVENOUS

## 2018-02-09 SURGICAL SUPPLY — 11 items
BAG SNAP BAND KOVER 36X36 (MISCELLANEOUS) ×3 IMPLANT
CATH CELSIUS THERMO F CV 7FR (ABLATOR) ×3 IMPLANT
CATH HEX JOS 2-5-2 65CM 6F REP (CATHETERS) ×3 IMPLANT
CATH JOSEPHSON QUAD-ALLRED 6FR (CATHETERS) ×6 IMPLANT
PACK EP LATEX FREE (CUSTOM PROCEDURE TRAY) ×2
PACK EP LF (CUSTOM PROCEDURE TRAY) ×1 IMPLANT
PAD DEFIB LIFELINK (PAD) ×3 IMPLANT
SHEATH AVANTI 11CM 7FR (SHEATH) ×3 IMPLANT
SHEATH AVANTI 11CM 8FR (SHEATH) ×3 IMPLANT
SHEATH PINNACLE 6F 10CM (SHEATH) ×6 IMPLANT
SHIELD RADPAD SCOOP 12X17 (MISCELLANEOUS) ×3 IMPLANT

## 2018-02-09 NOTE — Progress Notes (Addendum)
Site area: Right groin a 6 french X2 and 8 french venous sheath was removed  Site Prior to Removal:  Level 0  Pressure Applied For 20 MINUTES    Bedrest Beginning at 0940am  Manual:   Yes.    Patient Status During Pull:  stable  Post Pull Groin Site:  Level 0  Post Pull Instructions Given:  Yes.    Post Pull Pulses Present:  Yes.    Dressing Applied:  Yes.    Comments:

## 2018-02-09 NOTE — Interval H&P Note (Signed)
History and Physical Interval Note:  02/09/2018 7:11 AM  Jason Sweeney  has presented today for surgery, with the diagnosis of svt  The various methods of treatment have been discussed with the patient and family. After consideration of risks, benefits and other options for treatment, the patient has consented to  Procedure(s): SVT ABLATION (N/A) as a surgical intervention .  The patient's history has been reviewed, patient examined, no change in status, stable for surgery.  I have reviewed the patient's chart and labs.  Questions were answered to the patient's satisfaction.     Lewayne BuntingGregg Taylor

## 2018-02-09 NOTE — Progress Notes (Signed)
Site area: Right internal jugular a 7 french venous sheath was removed  Site Prior to Removal:  Level 0  Pressure Applied For 10 MINUTES    Bedrest Beginning at 0940am  Manual:   Yes.    Patient Status During Pull:  stable  Post Pull Groin Site:  Level 0  Post Pull Instructions Given:  Yes.    Post Pull Pulses Present:  Yes.    Dressing Applied:  Yes.    Comments:

## 2018-02-09 NOTE — Discharge Instructions (Signed)
**Note Jason Sweeney-Identified via Obfuscation** Ablacin cardaca (Cardiac Ablation) La ablacin cardaca es un Sweeney que se realiza para evitar que una parte del tejido cardaco ocasione problemas. El corazn tiene diferentes conexiones elctricas. En algunos casos, estas conexiones hacen que el corazn palpite muy rpido o Jason Sweeney manera irregular. Al extirpar alguna Jason Sweeney las zonas problemticas, puede mejorarse o normalizarse el ritmo cardaco. La ablacin se indica en aquellas personas que:  Sufren el sndrome Jason Sweeney.  Tienen otros ritmos cardacos rpidos (taquicardia).  Han tomado medicamentos para un ritmo cardaco anormal (arritmia) y los medicamentos: ? No tuvieron xito. ? Provocaron efectos secundarios.  Puede haber sufrido un tipo Jason Sweeney latido cardaco que podra causar la muerte. ANTES DEL Sweeney  Siga las instrucciones Jason Sweeney su mdico acerca Jason Sweeney comer y beber antes del Sweeney.  Tome los Jason Sweeney como le indic el mdico. Tmelos a intervalos regulares con agua, excepto que su mdico le haya indicado otra cosa.  Si recibe medicamentos para la diabetes, consulte a su mdico cmo debe utilizarlos. Consulte si hay instrucciones especiales que deba seguir. El mdico podr Jason Sweeney la cantidad Jason Sweeney insulina que deber recibir Jason Sweeney.  Sweeney  Se utilizar un tipo especial Jason Sweeney radiografas. Las radiografas permiten al mdico ver imgenes del corazn durante el Sweeney.  Le realizarn un pequeo corte (incisin) en el cuello o en la ingle.  Antes Jason Sweeney comenzar el Sweeney, le colocarn una va intravenosa (IV).  Le darn un medicamento que lo har dormir (anestsico) o un medicamento que lo ayudar a [name] (sedante).  Le adormecern la piel del cuello o Jason Sweeney la ingle.  Se insertar una aguja en una vena grande del cuello o Jason Sweeney la ingle.  Le colocarn un tubo flexible (catter) para que llegue hasta el corazn.  Le inyectarn una sustancia Jason Sweeney contraste por el  tubo. La sustancia Jason Sweeney aparecer en las radiografas. Esto permitir al Jason Sweeney ver la zona del corazn que necesita tratamiento.  Cuando se encuentra el tejido cardaco que causa los sntomas, la punta del tubo enviar corriente elctrica a esa zona. Esto har que no tenga ms problemas.  Luego el tubo se retira.  Aplicarn presin sobre la zona en la que estuvo el tubo. Esto se realiza para Jason Sweeney, Jason Sweeney el sangrado. Le colocarn una venda sobre la zona.  DESPUS DEL Sweeney  Lo trasladarn una sala Jason Sweeney. Le controlarn la presin arterial, la frecuencia cardaca y respiratoria. Controlarn la zona Jason Sweeney insercin del tubo para observar si hay sangrado. Deber permanecer acostado y quieto durante 4 - 6 horas. Esto impide que sangre en la zona Jason Sweeney insercin del tubo.     Cardiac Ablation, Care After This sheet gives you information about how to care for yourself after your procedure. Your health care provider may also give you more specific instructions. If you have problems or questions, contact your health care provider. What can I expect after the procedure? After the procedure, it is common to have:  Bruising around your puncture site.  Tenderness around your puncture site.  Skipped heartbeats.  Tiredness (fatigue).  Follow these instructions at home: Puncture site care  Follow instructions from your health care provider about how to take care of your puncture site. Make sure you: ? Wash your hands with soap and water before you change your bandage (dressing). If soap and water are not available, use hand sanitizer. ? Change your dressing as told by your health care provider. ? Leave stitches (sutures), skin glue, or adhesive strips in place. These skin closures **Note Jason Sweeney-Identified via Obfuscation** may need to stay in place for up to 2 weeks. If adhesive strip edges start to loosen and curl up, you may trim the loose edges. Do not remove adhesive strips completely unless your health care provider  tells you to do that.  Check your puncture site every day for signs of infection. Check for: ? Redness, swelling, or pain. ? Fluid or blood. If your puncture site starts to bleed, lie down on your back, apply firm pressure to the area, and contact your health care provider. ? Warmth. ? Pus or a bad smell. Driving  Ask your health care provider when it is safe for you to drive again after the procedure.  Do not drive or use heavy machinery while taking prescription pain medicine.  Do not drive for 24 hours if you were given a medicine to help you relax (sedative) during your procedure. Activity  Avoid activities that take a lot of effort for at least 3 days after your procedure.  Do not lift anything that is heavier than 10 lb (4.5 kg), or the limit that you are told, until your health care provider says that it is safe.  Return to your normal activities as told by your health care provider. Ask your health care provider what activities are safe for you. General instructions  Take over-the-counter and prescription medicines only as told by your health care provider.  Do not use any products that contain nicotine or tobacco, such as cigarettes and e-cigarettes. If you need help quitting, ask your health care provider.  Do not take baths, swim, or use a hot tub until your health care provider approves.  Do not drink alcohol for 24 hours after your procedure.  Keep all follow-up visits as told by your health care provider. This is important. Contact a health care provider if:  You have redness, mild swelling, or pain around your puncture site.  You have fluid or blood coming from your puncture site that stops after applying firm pressure to the area.  Your puncture site feels warm to the touch.  You have pus or a bad smell coming from your puncture site.  You have a fever.  You have chest pain or discomfort that spreads to your neck, jaw, or arm.  You are sweating a  lot.  You feel nauseous.  You have a fast or irregular heartbeat.  You have shortness of breath.  You are dizzy or light-headed and feel the need to lie down.  You have pain or numbness in the arm or leg closest to your puncture site. Get help right away if:  Your puncture site suddenly swells.  Your puncture site is bleeding and the bleeding does not stop after applying firm pressure to the area. These symptoms may represent a serious problem that is an emergency. Do not wait to see if the symptoms will go away. Get Jason help right away. Call your local emergency services (911 in the [place] Do not drive yourself to the hospital. Summary  After the procedure, it is normal to have bruising and tenderness at the puncture site in your groin, neck, or forearm.  Check your puncture site every day for signs of infection.  Get help right away if your puncture site is bleeding and the bleeding does not stop after applying firm pressure to the area. This is a Jason emergency. This information is not intended to replace advice given to you by your health care provider. Make sure you discuss any questions **Note Juanjose Mojica-Identified via Obfuscation** you have with your health care provider. Document Released: 11/24/2016 Document Revised: 11/24/2016 Document Reviewed: 11/24/2016 Elsevier Interactive Patient Education  2018 [employer].  Esta informacin no tiene Theme park manager el consejo del mdico. Asegrese Daiana Vitiello hacerle al mdico cualquier pregunta que tenga. Document Released: 04/17/2013 Document Revised: 09/05/2014 Document Reviewed: 01/10/2013 Elsevier Interactive Patient Education  2017 [employer].

## 2018-02-12 ENCOUNTER — Encounter (HOSPITAL_COMMUNITY): Payer: Self-pay | Admitting: Internal Medicine

## 2018-02-26 ENCOUNTER — Ambulatory Visit: Payer: Self-pay | Admitting: Cardiovascular Disease

## 2018-02-28 ENCOUNTER — Encounter: Payer: Self-pay | Admitting: Cardiovascular Disease

## 2018-02-28 ENCOUNTER — Ambulatory Visit (INDEPENDENT_AMBULATORY_CARE_PROVIDER_SITE_OTHER): Payer: Self-pay | Admitting: Cardiovascular Disease

## 2018-02-28 VITALS — BP 154/90 | HR 50 | Ht 65.0 in | Wt 230.4 lb

## 2018-02-28 DIAGNOSIS — I714 Abdominal aortic aneurysm, without rupture, unspecified: Secondary | ICD-10-CM

## 2018-02-28 DIAGNOSIS — Z9889 Other specified postprocedural states: Secondary | ICD-10-CM

## 2018-02-28 DIAGNOSIS — I10 Essential (primary) hypertension: Secondary | ICD-10-CM

## 2018-02-28 DIAGNOSIS — R001 Bradycardia, unspecified: Secondary | ICD-10-CM

## 2018-02-28 MED ORDER — AMLODIPINE BESYLATE 10 MG PO TABS: 10.0000 mg | ORAL_TABLET | Freq: Every day | ORAL | 3 refills | Status: DC

## 2018-02-28 MED ORDER — METOPROLOL TARTRATE 25 MG PO TABS: 25.0000 mg | ORAL_TABLET | Freq: Two times a day (BID) | ORAL | 3 refills | Status: DC

## 2018-02-28 NOTE — Progress Notes (Signed)
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 Ladona Ridgel. He underwent a low risk nuclear stress test on 01/15/2018.  Echocardiogram on 12/15/2017 demonstrated normal left ventricular systolic and diastolic function, LVEF 60 to 40%, with severe LVH.  He denies chest pain, palpitations, leg swelling, and shortness of breath.  He does feel somewhat fatigued but denies dizziness and syncope.  He has some lower muscular back pain aggravated by certain positions or lifting.   Review of Systems: As per "subjective", otherwise negative.  No Known Allergies  Current Outpatient Medications  Medication Sig Dispense Refill  . amLODipine (NORVASC) 5 MG tablet Take 1 tablet (5 mg total) by mouth daily. 90 tablet 3  . aspirin EC 81 MG EC tablet Take 1 tablet (81 mg total) by mouth daily.    Marland Kitchen docusate sodium (COLACE) 100 MG capsule Take 1 capsule (100 mg total) by mouth 2 (two) times daily as needed for mild constipation. 30 capsule 0  . ibuprofen (ADVIL,MOTRIN) 200 MG tablet Take 400 mg by mouth every 6 (six) hours as needed for headache or moderate pain.     . metoprolol tartrate (LOPRESSOR) 100 MG tablet Take 1 tablet (100 mg total) by mouth 2 (two) times daily. 180 tablet 3   No current facility-administered medications for this visit.     Past Medical History:  Diagnosis Date  . AKI (acute kidney injury) (HCC) 12/14/2017  . HTN (hypertension) 12/14/2017  . SVT (supraventricular tachycardia) (HCC) 12/14/2017    Past Surgical History:  Procedure Laterality Date  . ABDOMINAL SURGERY    . SHOULDER SURGERY Left   . SHOULDER SURGERY    . SVT ABLATION N/A 02/09/2018   Procedure: SVT ABLATION;  Surgeon: Marinus Maw, MD;  Location: [facility] INVASIVE CV LAB;  Service: Cardiovascular;  Laterality: N/A;    Social History   Socioeconomic History  . Marital status: Married    Spouse name: Not on file  . Number of  children: Not on file  . Years of education: Not on file  . Highest education level: Not on file  Occupational History  . Not on file  Social Needs  . Financial resource strain: Not on file  . Food insecurity:    Worry: Not on file    Inability: Not on file  . Transportation needs:    Medical: Not on file    Non-medical: Not on file  Tobacco Use  . Smoking status: Former Smoker    Types: Cigarettes    Last attempt to quit: 08/29/1993    Years since quitting: 24.5  . Smokeless tobacco: Never Used  Substance and Sexual Activity  . Alcohol use: Yes    Comment: occasionally  . Drug use: Not Currently  . Sexual activity: Not on file  Lifestyle  . Physical activity:    Days per week: Not on file    Minutes per session: Not on file  . Stress: Not on file  Relationships  . Social connections:    Talks on phone: Not on file    Gets together: Not on file    Attends religious service: Not on file    Active member of club or organization: Not on file    Attends meetings of clubs or organizations: Not on file    Relationship status: Not on file  . Intimate partner violence:    Fear of current or ex partner: Not on file  Emotionally abused: Not on file    Physically abused: Not on file    Forced sexual activity: Not on file  Other Topics Concern  . Not on file  Social History Narrative  . Not on file     Vitals:   02/28/18 1259  BP: (!) 154/90  Pulse: (!) 50  SpO2: 92%  Weight: 230 lb 6.4 oz (104.5 kg)  Height: 5\' 5"  (1.651 m)    Wt Readings from Last 3 Encounters:  02/28/18 230 lb 6.4 oz (104.5 kg)  02/09/18 220 lb (99.8 kg)  02/01/18 228 lb (103.4 kg)     PHYSICAL EXAM General: NAD HEENT: Normal. Neck: No JVD, no thyromegaly. Lungs: Clear to auscultation bilaterally with normal respiratory effort. CV: Bradycardic, regular rhythm, normal S1/S2, no S3/S4, no murmur. No pretibial or periankle edema.  No carotid bruit.   Abdomen: Soft, nontender, no distention.    Neurologic: Alert and oriented.  Psych: Normal affect. Skin: Normal. Musculoskeletal: No gross deformities.    ECG: Reviewed above under Subjective   Labs: Lab Results  Component Value Date/Time   K 3.6 01/11/2018 08:25 AM   BUN 14 01/11/2018 08:25 AM   CREATININE 0.96 01/11/2018 08:25 AM   ALT 39 01/11/2018 08:25 AM   TSH 0.480 12/15/2017 09:10 AM   HGB 17.5 (H) 01/11/2018 08:25 AM     Lipids: Lab Results  Component Value Date/Time   LDLCALC 77 12/15/2017 03:46 AM   CHOL 135 12/15/2017 03:46 AM   TRIG 134 12/15/2017 03:46 AM   HDL 31 (L) 12/15/2017 03:46 AM       ASSESSMENT AND PLAN: 1.  AVNRT status post ablation: Symptomatically stable.  As he is bradycardic with accompanying fatigue, I will reduce metoprolol to 25 mg twice daily.  2.  Hypertension: This remains elevated.  I am reducing metoprolol to 25 mg twice daily due to bradycardia.  I will increase amlodipine to 10 mg daily.  3.  Abdominal aortic aneurysm: Recent imaging demonstrated a distal aortic aneurysm measuring 4.7 cm.  This is now being followed by vascular surgery.    Disposition: Follow up with Dr. Ladona Ridgelaylor as scheduled.  Follow-up with me as needed.   Prentice DockerSuresh Koneswaran, M.D., F.A.C.C.

## 2018-02-28 NOTE — Patient Instructions (Signed)
Medication Instructions:   Your physician has recommended you make the following change in your medication:   Increase amlodipine to 10 mg by mouth daily. You may take (2) of your 5 mg tablets daily until they are finished.  Decrease metoprolol tartrate to 25 mg by mouth twice daily.   Continue all other medications the same.  Labwork:  NONE  Testing/Procedures:  NONE  Follow-Up:  Your physician recommends that you schedule a follow-up appointment in: as needed.  Any Other Special Instructions Will Be Listed Below (If Applicable).  If you need a refill on your cardiac medications before your next appointment, please call your pharmacy.

## 2018-03-14 ENCOUNTER — Ambulatory Visit (INDEPENDENT_AMBULATORY_CARE_PROVIDER_SITE_OTHER): Payer: Self-pay | Admitting: Internal Medicine

## 2018-03-14 ENCOUNTER — Encounter: Payer: Self-pay | Admitting: Internal Medicine

## 2018-03-14 VITALS — BP 122/70 | HR 50 | Ht 65.0 in | Wt 228.0 lb

## 2018-03-14 DIAGNOSIS — I471 Supraventricular tachycardia: Secondary | ICD-10-CM

## 2018-03-14 NOTE — Patient Instructions (Addendum)
Medication Instructions:  Your physician recommends that you continue on your current medications as directed. Please refer to the Current Medication list given to you today.  Labwork: None ordered.  Testing/Procedures: None ordered.  Follow-Up: Your physician wants you to follow-up in: as needed with Dr. Taylor.      Any Other Special Instructions Will Be Listed Below (If Applicable).  If you need a refill on your cardiac medications before your next appointment, please call your pharmacy.   

## 2018-03-14 NOTE — Progress Notes (Signed)
HPI [name] returns today for followup. He is a pleasant 62 yo man with SVT who underwent EP study and catheter ablation of AVNRT a month ago. In the interim, he has done well. He denies palpitations or chest pain.  No Known Allergies   Current Outpatient Medications  Medication Sig Dispense Refill  . amLODipine (NORVASC) 10 MG tablet Take 1 tablet (10 mg total) by mouth daily. 90 tablet 3  . aspirin EC 81 MG EC tablet Take 1 tablet (81 mg total) by mouth daily.    Marland Kitchen docusate sodium (COLACE) 100 MG capsule Take 1 capsule (100 mg total) by mouth 2 (two) times daily as needed for mild constipation. 30 capsule 0  . ibuprofen (ADVIL,MOTRIN) 200 MG tablet Take 400 mg by mouth every 6 (six) hours as needed for headache or moderate pain.     . metoprolol tartrate (LOPRESSOR) 25 MG tablet Take 1 tablet (25 mg total) by mouth 2 (two) times daily. 180 tablet 3   No current facility-administered medications for this visit.      Past Medical History:  Diagnosis Date  . AKI (acute kidney injury) (HCC) 12/14/2017  . HTN (hypertension) 12/14/2017  . SVT (supraventricular tachycardia) (HCC) 12/14/2017    ROS:   All systems reviewed and negative except as noted in the HPI.   Past Surgical History:  Procedure Laterality Date  . ABDOMINAL SURGERY    . SHOULDER SURGERY Left   . SHOULDER SURGERY    . SVT ABLATION N/A 02/09/2018   Procedure: SVT ABLATION;  Surgeon: Marinus Maw, MD;  Location: [facility] INVASIVE CV LAB;  Service: Cardiovascular;  Laterality: N/A;     Family History  Problem Relation Age of Onset  . Hypertension Mother   . Heart disease Mother      Social History   Socioeconomic History  . Marital status: Married    Spouse name: Not on file  . Number of children: Not on file  . Years of education: Not on file  . Highest education level: Not on file  Occupational History  . Not on file  Social Needs  . Financial resource strain: Not on file  . Food insecurity:     Worry: Not on file    Inability: Not on file  . Transportation needs:    Medical: Not on file    Non-medical: Not on file  Tobacco Use  . Smoking status: Former Smoker    Types: Cigarettes    Last attempt to quit: 08/29/1993    Years since quitting: 24.5  . Smokeless tobacco: Never Used  Substance and Sexual Activity  . Alcohol use: Yes    Comment: occasionally  . Drug use: Not Currently  . Sexual activity: Not on file  Lifestyle  . Physical activity:    Days per week: Not on file    Minutes per session: Not on file  . Stress: Not on file  Relationships  . Social connections:    Talks on phone: Not on file    Gets together: Not on file    Attends religious service: Not on file    Active member of club or organization: Not on file    Attends meetings of clubs or organizations: Not on file    Relationship status: Not on file  . Intimate partner violence:    Fear of current or ex partner: Not on file    Emotionally abused: Not on file    Physically abused:  Not on file    Forced sexual activity: Not on file  Other Topics Concern  . Not on file  Social History Narrative  . Not on file     BP 122/70   Pulse (!) 50   Ht 5\' 5"  (1.651 m)   Wt 228 lb (103.4 kg)   BMI 37.94 kg/m   Physical Exam:  Well appearing NAD HEENT: Unremarkable Neck:  No JVD, no thyromegally Lymphatics:  No adenopathy Back:  No CVA tenderness Lungs:  Clear with no wheezes HEART:  Regular rate rhythm, no murmurs, no rubs, no clicks Abd:  soft, positive bowel sounds, no organomegally, no rebound, no guarding Ext:  2 plus pulses, no edema, no cyanosis, no clubbing Skin:  No rashes no nodules Neuro:  CN II through XII intact, motor grossly intact  EKG - NSR  Assess/Plan: 1. SVT - he is s/p ablation and has had no recurrent palpitations.  2. HTN - we discussed his need to continue amlodipine and beta blockers for his HTN.   Jason Sweeney,M.D.

## 2018-07-04 ENCOUNTER — Other Ambulatory Visit (HOSPITAL_COMMUNITY): Payer: Self-pay

## 2018-07-04 ENCOUNTER — Ambulatory Visit: Payer: Self-pay | Admitting: Vascular Surgery

## 2018-07-20 ENCOUNTER — Other Ambulatory Visit: Payer: Self-pay

## 2018-07-20 ENCOUNTER — Emergency Department (HOSPITAL_COMMUNITY)
Admission: EM | Admit: 2018-07-20 | Discharge: 2018-07-20 | Disposition: A | Discharge status: Home / Self Care | Payer: Self-pay | Attending: Emergency Medicine | Admitting: Emergency Medicine

## 2018-07-20 ENCOUNTER — Encounter (HOSPITAL_COMMUNITY): Payer: Self-pay | Admitting: Emergency Medicine

## 2018-07-20 ENCOUNTER — Emergency Department (HOSPITAL_COMMUNITY): Payer: Self-pay

## 2018-07-20 DIAGNOSIS — K5732 Diverticulitis of large intestine without perforation or abscess without bleeding: Secondary | ICD-10-CM | POA: Insufficient documentation

## 2018-07-20 DIAGNOSIS — I10 Essential (primary) hypertension: Secondary | ICD-10-CM | POA: Insufficient documentation

## 2018-07-20 DIAGNOSIS — N400 Enlarged prostate without lower urinary tract symptoms: Secondary | ICD-10-CM

## 2018-07-20 DIAGNOSIS — N401 Enlarged prostate with lower urinary tract symptoms: Secondary | ICD-10-CM | POA: Insufficient documentation

## 2018-07-20 DIAGNOSIS — Z87891 Personal history of nicotine dependence: Secondary | ICD-10-CM | POA: Insufficient documentation

## 2018-07-20 DIAGNOSIS — R350 Frequency of micturition: Secondary | ICD-10-CM | POA: Insufficient documentation

## 2018-07-20 DIAGNOSIS — K5792 Diverticulitis of intestine, part unspecified, without perforation or abscess without bleeding: Secondary | ICD-10-CM

## 2018-07-20 DIAGNOSIS — Z7982 Long term (current) use of aspirin: Secondary | ICD-10-CM | POA: Insufficient documentation

## 2018-07-20 LAB — COMPREHENSIVE METABOLIC PANEL
ALK PHOS: 61 U/L (ref 38–126)
ALT: 23 U/L (ref 0–44)
AST: 18 U/L (ref 15–41)
Albumin: 4 g/dL (ref 3.5–5.0)
Anion gap: 9 (ref 5–15)
BILIRUBIN TOTAL: 1.3 mg/dL — AB (ref 0.3–1.2)
BUN: 13 mg/dL (ref 8–23)
CO2: 25 mmol/L (ref 22–32)
Calcium: 9.1 mg/dL (ref 8.9–10.3)
Chloride: 102 mmol/L (ref 98–111)
Creatinine, Ser: 0.71 mg/dL (ref 0.61–1.24)
GFR calc Af Amer: >60 mL/min (ref >60)
GLUCOSE: 125 mg/dL — AB (ref 70–99)
Potassium: 3.5 mmol/L (ref 3.5–5.1)
Sodium: 136 mmol/L (ref 135–145)
Total Protein: 8.3 g/dL — ABNORMAL HIGH (ref 6.5–8.1)

## 2018-07-20 LAB — CBC
HCT: 49.7 % (ref 39.0–52.0)
HEMOGLOBIN: 16.9 g/dL (ref 13.0–17.0)
MCH: 31.5 pg (ref 26.0–34.0)
MCHC: 34 g/dL (ref 30.0–36.0)
MCV: 92.6 fL (ref 80.0–100.0)
NRBC: 0 % (ref 0.0–0.2)
Platelets: 262 10*3/uL (ref 150–400)
RBC: 5.37 MIL/uL (ref 4.22–5.81)
RDW: 13.9 % (ref 11.5–15.5)
WBC: 10.3 10*3/uL (ref 4.0–10.5)

## 2018-07-20 LAB — URINALYSIS, ROUTINE W REFLEX MICROSCOPIC
Bacteria, UA: NONE SEEN
Bilirubin Urine: NEGATIVE
Glucose, UA: NEGATIVE mg/dL
KETONES UR: NEGATIVE mg/dL
LEUKOCYTES UA: NEGATIVE
Nitrite: NEGATIVE
PROTEIN: NEGATIVE mg/dL
Specific Gravity, Urine: 1.014 (ref 1.005–1.030)
pH: 6 (ref 5.0–8.0)

## 2018-07-20 LAB — LIPASE, BLOOD: LIPASE: 26 U/L (ref 11–51)

## 2018-07-20 MED ORDER — METRONIDAZOLE 500 MG PO TABS
500.0000 mg | ORAL_TABLET | Freq: Once | ORAL | Status: AC
  Administered 2018-07-20: 500 mg via ORAL
  Filled 2018-07-20: qty 1

## 2018-07-20 MED ORDER — METRONIDAZOLE 500 MG PO TABS: 500.0000 mg | ORAL_TABLET | Freq: Two times a day (BID) | ORAL | 0 refills | Status: DC

## 2018-07-20 MED ORDER — HYDROCODONE-ACETAMINOPHEN 5-325 MG PO TABS: ORAL_TABLET | ORAL | 0 refills | Status: DC

## 2018-07-20 MED ORDER — IOPAMIDOL (ISOVUE-300) INJECTION 61%
100.0000 mL | Freq: Once | INTRAVENOUS | Status: AC | PRN
  Administered 2018-07-20: 100 mL via INTRAVENOUS

## 2018-07-20 MED ORDER — CIPROFLOXACIN HCL 500 MG PO TABS: 500.0000 mg | ORAL_TABLET | Freq: Two times a day (BID) | ORAL | 0 refills | Status: DC

## 2018-07-20 MED ORDER — CIPROFLOXACIN HCL 250 MG PO TABS
500.0000 mg | ORAL_TABLET | Freq: Once | ORAL | Status: AC
  Administered 2018-07-20: 500 mg via ORAL
  Filled 2018-07-20: qty 2

## 2018-07-20 NOTE — ED Notes (Signed)
Patient transported to X-ray 

## 2018-07-20 NOTE — ED Triage Notes (Signed)
Pt c/o LT sided flank pain that began this morning. Endorses urinary frequency, but this is normal for him. Denies n/v/d.

## 2018-07-20 NOTE — Discharge Instructions (Signed)
It's important that you take the antibiotics as directed until they are finished.  Call Dr. Darrick PennaFields to arrange a follow-up appt because you will likely need a colonoscopy.  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  Return here for any worsening symptoms such as increasing pain, vomiting or fever.

## 2018-07-20 NOTE — ED Provider Notes (Signed)
[facility] EMERGENCY DEPARTMENT Provider Note   CSN: [id number] Arrival date & time: 07/20/18  4098     History   Chief Complaint Chief Complaint  Patient presents with  . Flank Pain    HPI Jason Sweeney is a 62 y.o. male.  HPI   Jason Sweeney is a 62 y.o. male past medical history of SVT, hypertension, AAA and AKI.  He presents to the Emergency Department complaining of left lower abdominal pain that began yesterday morning.  He describes the pain as focal, but a "pulling" sensation into his left lower back at times.  Pain has been constant since onset and not affected by movement, food intake or urination.  Pain improved slightly after taking Tylenol this morning.  He endorses a history of kidney stones, but states this pain is different.  He states that he urinates frequently but this is not unusual for him.  No pain with urination or hematuria.  No recent injury.  His symptoms are not associated with chest pain, fever, shortness of breath, nausea vomiting or diarrhea.   Past Medical History:  Diagnosis Date  . AKI (acute kidney injury) (HCC) 12/14/2017  . HTN (hypertension) 12/14/2017  . SVT (supraventricular tachycardia) (HCC) 12/14/2017    Patient Active Problem List   Diagnosis Date Noted  . AAA (abdominal aortic aneurysm) without rupture (HCC) 12/15/2017  . Abnormal [place] (ultrasound) of abdomen   . SVT (supraventricular tachycardia) (HCC) 12/14/2017  . AKI (acute kidney injury) (HCC) 12/14/2017  . HTN (hypertension) 12/14/2017  . Elevated troponin 12/14/2017    Past Surgical History:  Procedure Laterality Date  . ABDOMINAL SURGERY    . SHOULDER SURGERY Left   . SHOULDER SURGERY    . SVT ABLATION N/A 02/09/2018   Procedure: SVT ABLATION;  Surgeon: Marinus Maw, MD;  Location: [facility] INVASIVE CV LAB;  Service: Cardiovascular;  Laterality: N/A;        Home Medications    Prior to Admission medications   Medication Sig Start Date End Date Taking? Authorizing  Provider  amLODipine (NORVASC) 10 MG tablet Take 1 tablet (10 mg total) by mouth daily. 02/28/18 05/29/18  Laqueta Linden, MD  aspirin EC 81 MG EC tablet Take 1 tablet (81 mg total) by mouth daily. 12/16/17   Johnson, Clanford L, MD  docusate sodium (COLACE) 100 MG capsule Take 1 capsule (100 mg total) by mouth 2 (two) times daily as needed for mild constipation. 12/15/17   Johnson, Clanford L, MD  ibuprofen (ADVIL,MOTRIN) 200 MG tablet Take 400 mg by mouth every 6 (six) hours as needed for headache or moderate pain.     [provider]  metoprolol tartrate (LOPRESSOR) 25 MG tablet Take 1 tablet (25 mg total) by mouth 2 (two) times daily. 02/28/18 05/29/18  Laqueta Linden, MD    Family History Family History  Problem Relation Age of Onset  . Hypertension Mother   . Heart disease Mother     Social History Social History   Tobacco Use  . Smoking status: Former Smoker    Types: Cigarettes    Last attempt to quit: 08/29/1993    Years since quitting: 24.9  . Smokeless tobacco: Never Used  Substance Use Topics  . Alcohol use: Yes    Comment: occasionally  . Drug use: Not Currently     Allergies   Patient has no known allergies.   Review of Systems Review of Systems  Constitutional: Negative for activity change, appetite change, chills and fever.  Respiratory: Negative for chest tightness and shortness of breath.   Cardiovascular: Negative for chest pain and leg swelling.  Gastrointestinal: Positive for abdominal pain. Negative for constipation, diarrhea, nausea and vomiting.  Genitourinary: Positive for flank pain and frequency. Negative for decreased urine volume, difficulty urinating, dysuria and hematuria.  Musculoskeletal: Negative for back pain.  Skin: Negative for rash.  Neurological: Negative for dizziness, weakness and numbness.  Hematological: Negative for adenopathy.  Psychiatric/Behavioral: Negative for confusion.     Physical Exam Updated Vital  Signs BP (!) 151/96   Pulse 71   Ht 5\' 6"  (1.676 m)   Wt 102.1 kg   SpO2 95%   BMI 36.32 kg/m   Physical Exam  Constitutional: He appears well-nourished. No distress.  HENT:  Head: Atraumatic.  Mouth/Throat: Oropharynx is clear and moist.  Neck: Normal range of motion. No JVD present.  Cardiovascular: Normal rate, regular rhythm and intact distal pulses.  Pulmonary/Chest: Effort normal and breath sounds normal.  Abdominal: Soft. He exhibits no mass. There is tenderness. There is no rebound.  Focal tenderness to palpation of the left lower quadrant.  No guarding or rebound tenderness.  Abdomen is soft.  Musculoskeletal: Normal range of motion. He exhibits no edema.  Neurological: He is alert. No sensory deficit.  Skin: Skin is warm. Capillary refill takes less than 2 seconds. No rash noted.  Nursing note and vitals reviewed.    ED Treatments / Results  Labs (all labs ordered are listed, but only abnormal results are displayed) Labs Reviewed  COMPREHENSIVE METABOLIC PANEL - Abnormal; Notable for the following components:      Result Value   Glucose, Bld 125 (*)    Total Protein 8.3 (*)    Total Bilirubin 1.3 (*)    All other components within normal limits  URINALYSIS, ROUTINE W REFLEX MICROSCOPIC - Abnormal; Notable for the following components:   Hgb urine dipstick SMALL (*)    All other components within normal limits  LIPASE, BLOOD  CBC    EKG None  Radiology Ct Abdomen Pelvis W Contrast  Result Date: 07/20/2018 CLINICAL DATA:  Left lower quadrant abdominal pain for the past 2 days. Left flank pain since this morning. EXAM: CT ABDOMEN AND PELVIS WITH CONTRAST TECHNIQUE: Multidetector CT imaging of the abdomen and pelvis was performed using the standard protocol following bolus administration of intravenous contrast. CONTRAST:  ISOVUE-300 IOPAMIDOL (ISOVUE-300) INJECTION 61% COMPARISON:  Renal ultrasound dated 12/15/2017. FINDINGS: Lower chest: Minimal  bibasilar atelectasis. Hepatobiliary: Diffuse low density of the liver. Cholecystectomy clips. Pancreas: Unremarkable. No pancreatic ductal dilatation or surrounding inflammatory changes. Spleen: Very small spleen. Adrenals/Urinary Tract: Right renal cysts. Normal appearing left kidney, adrenal glands, ureters and urinary bladder. Stomach/Bowel: Multiple sigmoid and descending colon diverticula. Pericolonic soft tissue stranding at the level of the proximal sigmoid colon with a tiny amount of extraluminal gas. No fluid collection. No free peritoneal fluid elsewhere in the abdomen. Unremarkable stomach, the appendix is slightly enlarged, measuring 7.9 mm in diameter. No periappendiceal soft tissue stranding is seen. Unremarkable stomach and small bowel. Vascular/Lymphatic: Infrarenal abdominal aortic aneurysm with a maximum diameter of 4.3 cm. Atheromatous arterial calcifications. No enlarged lymph nodes. Reproductive: Moderate to marked enlargement and mild heterogeneity of the prostate gland. This is protruding into the bladder base. Other: Small to moderate-sized umbilical hernia containing fat Musculoskeletal: Mild lumbar and lower thoracic spine degenerative changes. IMPRESSION: 1. Sigmoid diverticulosis and acute diverticulitis without abscess. 2. 4.3 cm infrarenal abdominal aortic aneurysm. 3. Marked diffuse  hepatic steatosis. 4. Moderate to marked prostatic hypertrophy. 5. Small to moderate-sized umbilical hernia containing fat. Electronically Signed   By: Beckie SaltsSteven  Reid M.D.   On: 07/20/2018 10:52    Procedures Procedures (including critical care time)  Medications Ordered in ED Medications - No data to display   Initial Impression / Assessment and Plan / ED Course  I have reviewed the triage vital signs and the nursing notes.  Pertinent labs & imaging results that were available during my care of the patient were reviewed by me and considered in my medical decision making (see chart for  details).     On recheck, after CT scan pt is eating a biscuit and drinking coffee.  Vitals stable and pain is reported as minimal.  Acute diverticulitis w/o abscess per CT scan,  Pt appears appropriate for d/c home.  Agrees to tx plan with cipro, Flagyl and GI f/u, discussed need for colonoscopy as pt is unclear if he has had one.  Referral also given for urology regarding enlg prostate.  Return precautions discussed.   Final Clinical Impressions(s) / ED Diagnoses   Final diagnoses:  Diverticulitis  Enlarged prostate    ED Discharge Orders    None       Pauline Ausriplett, Lyle Niblett, PA-C 07/20/18 1342    Sabas SousBero, Michael M, MD 07/20/18 [phone]

## 2018-09-12 ENCOUNTER — Ambulatory Visit: Payer: Self-pay | Admitting: Vascular Surgery

## 2018-09-12 ENCOUNTER — Other Ambulatory Visit (HOSPITAL_COMMUNITY): Payer: Self-pay

## 2018-09-13 ENCOUNTER — Encounter: Payer: Self-pay | Admitting: Vascular Surgery

## 2018-11-03 IMAGING — US US RENAL
1 series · 14 of 25 positions shown · non-contrast
Comparison: None.

CLINICAL DATA: Acute kidney injury. Bilateral flank pain for 3
years. History of hypertension.

EXAM:
RENAL / URINARY TRACT ULTRASOUND COMPLETE

[Series 1: us renal · 0.28mm/px · 14 of 52 slices shown]
[im 1/52]
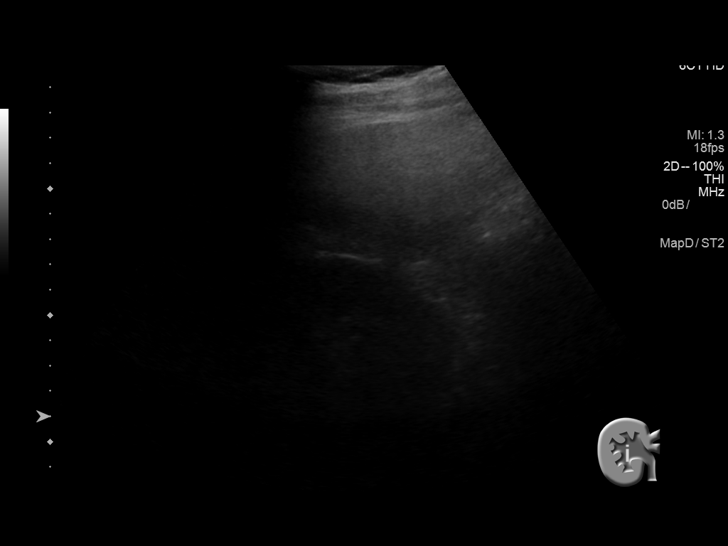
[im 5/52]
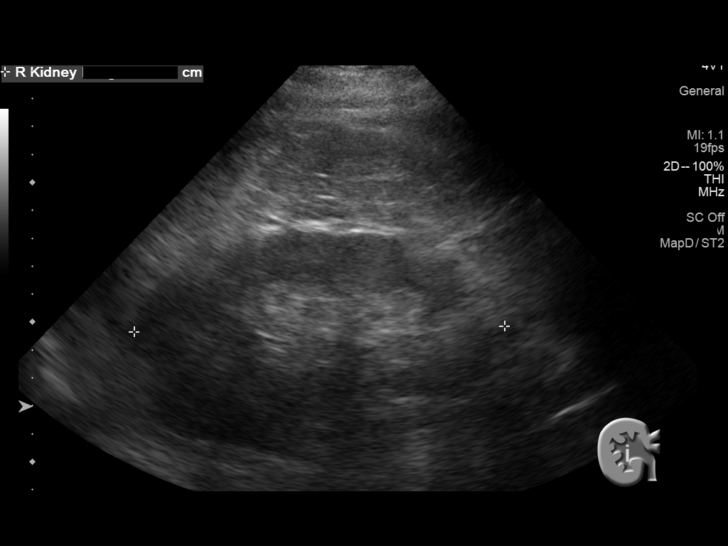
[im 9/52]
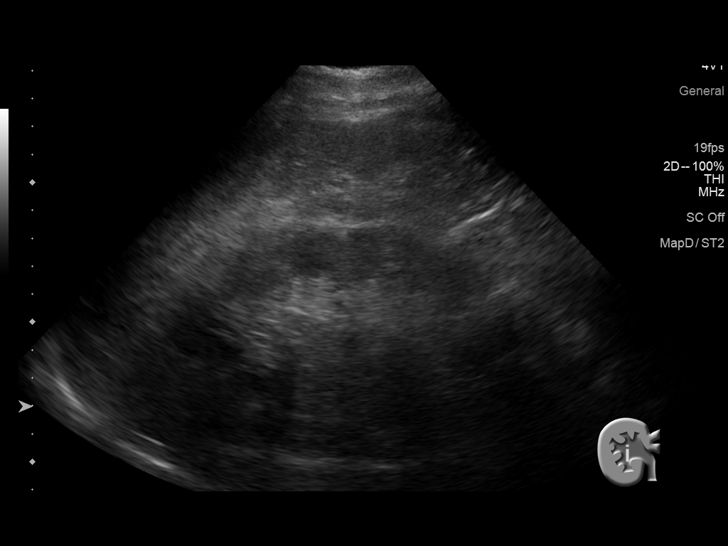
[im 13/52]
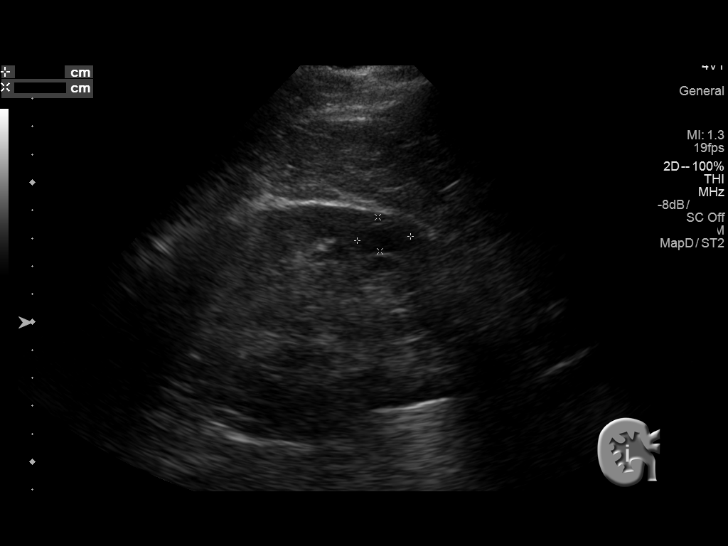
[im 18/52]
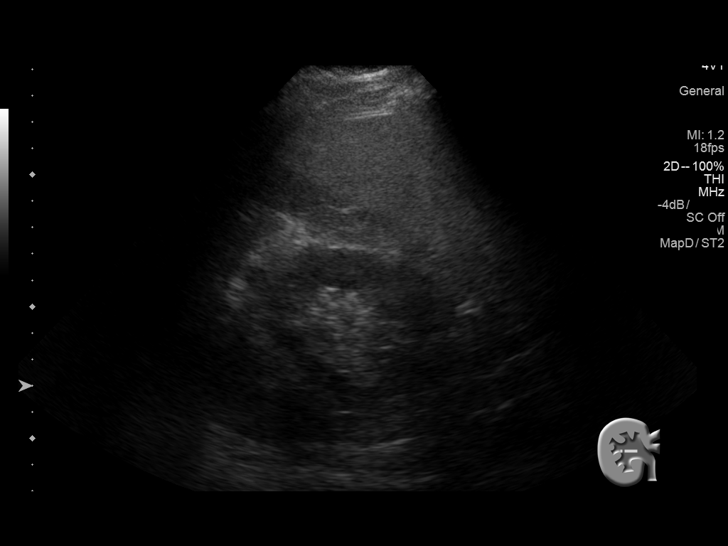
[im 20/52]
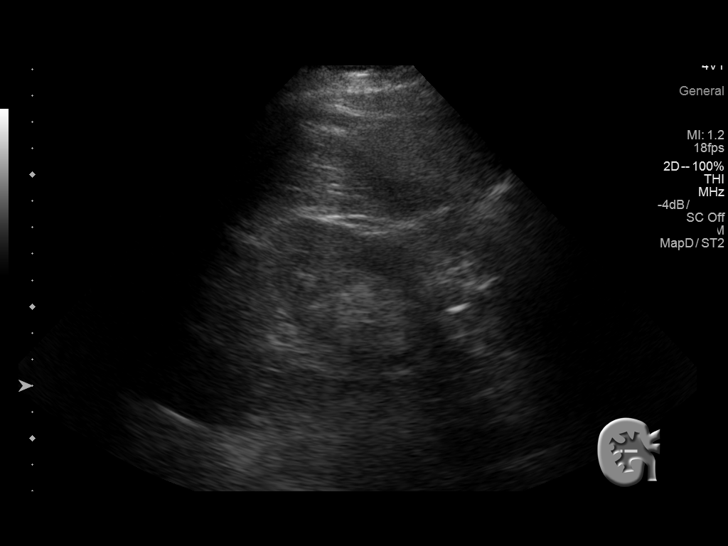
[im 24/52]
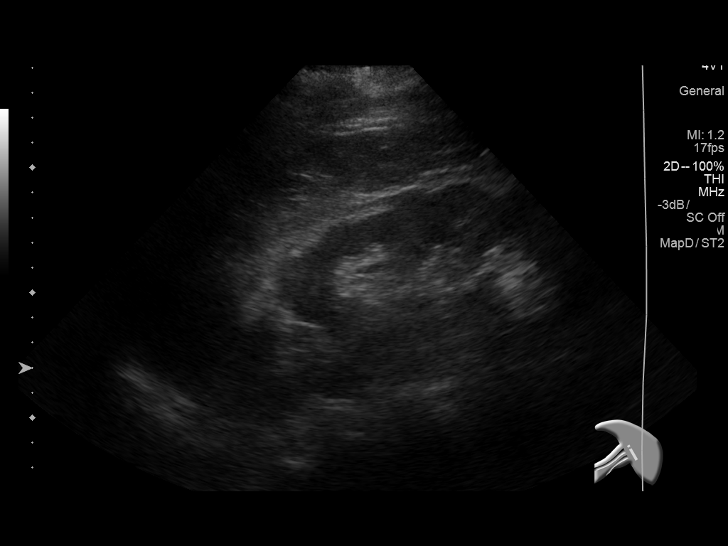
[im 28/52]
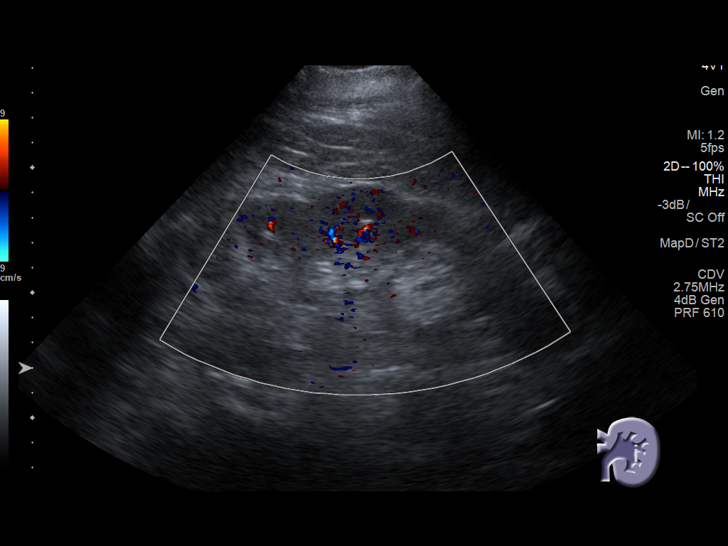
[im 32/52]
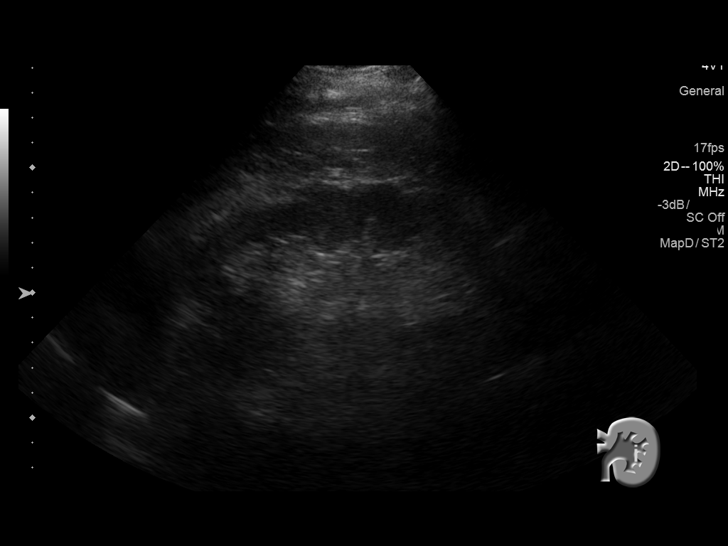
[im 35/52]
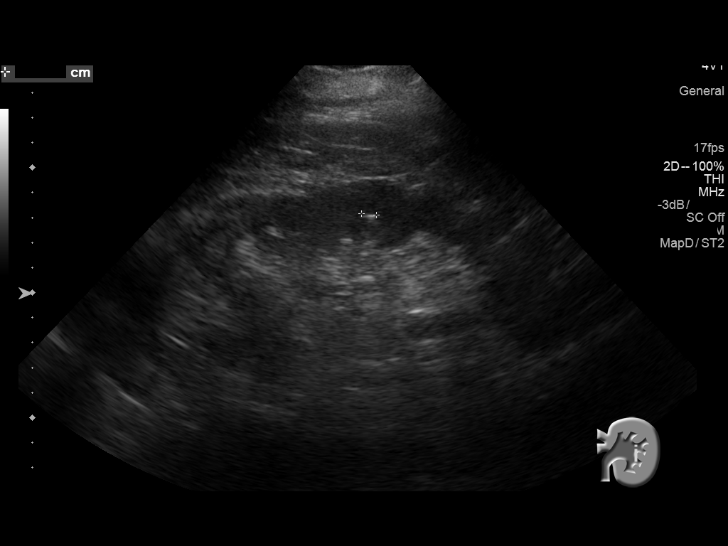
[im 39/52]
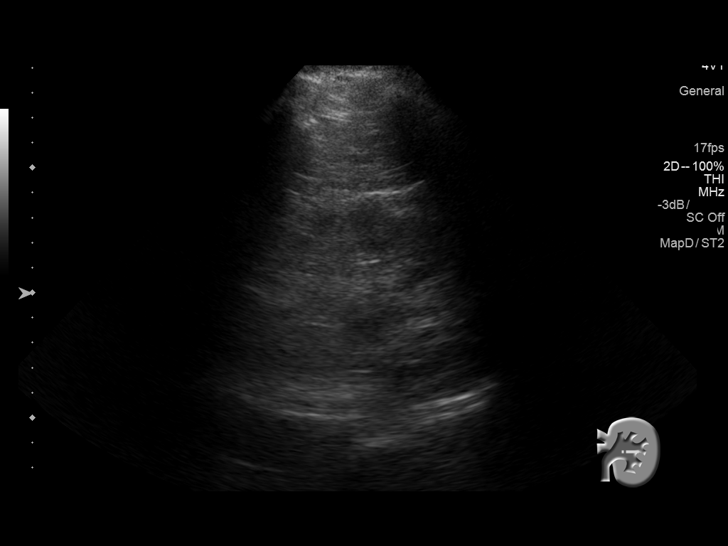
[im 43/52]
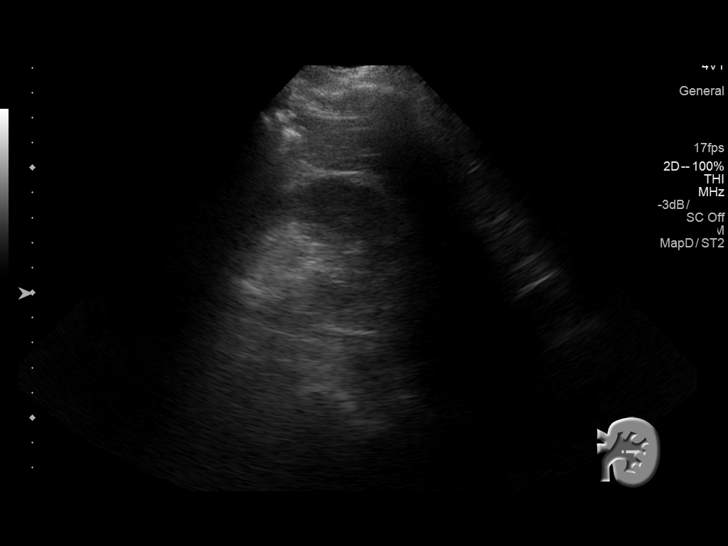
[im 47/52]
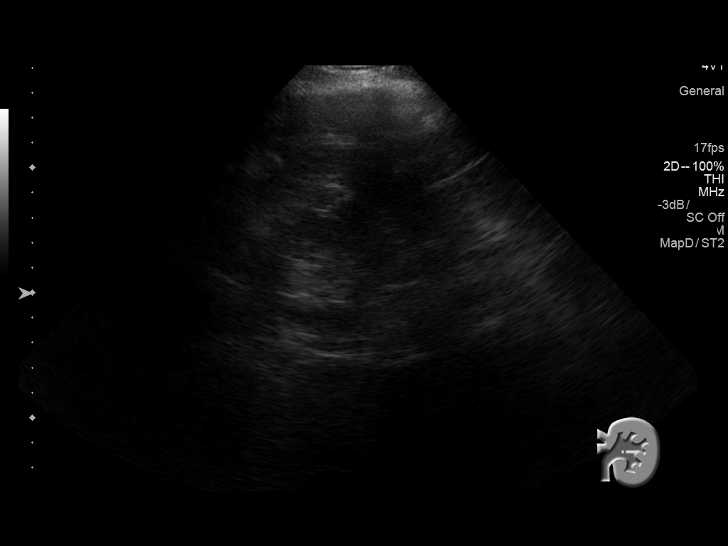
[im 52/52]
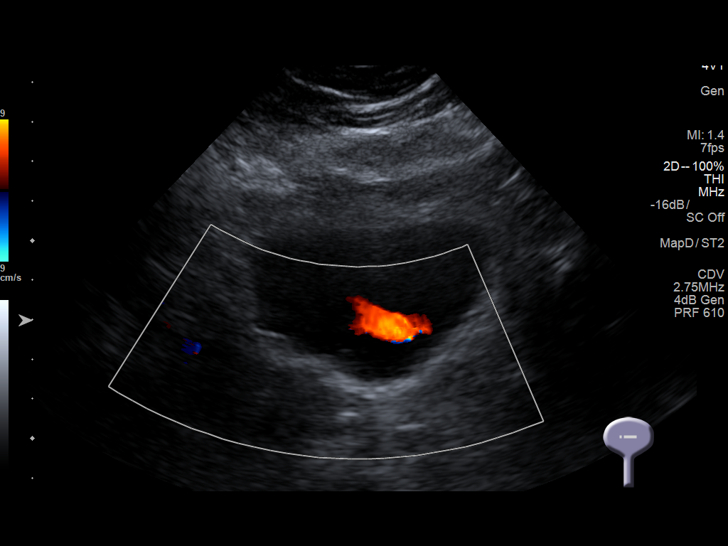

[14 of 25 positions shown; findings below may reference images not displayed]

FINDINGS: Right Kidney:

Length: 13.1 centimeters. Mid to LOWER pole simple cyst is
centimeters. No hydronephrosis or suspicious mass.

Left Kidney:

Length: 14.5 centimeters. Normal echogenicity. No hydronephrosis.
Midpole calculus is estimated to be 6 millimeters in diameter. No
cystic or solid mass.

Bladder:

Bilateral ureteral jets are present.
IMPRESSION: 1. No hydronephrosis.
2. RIGHT renal cyst.
3. Suspect intrarenal calculus in the midpole the RIGHT kidney, 6
millimeters in diameter.

## 2018-12-10 ENCOUNTER — Other Ambulatory Visit: Payer: Self-pay

## 2018-12-10 ENCOUNTER — Emergency Department (HOSPITAL_COMMUNITY)
Admission: EM | Admit: 2018-12-10 | Discharge: 2018-12-10 | Disposition: A | Discharge status: Home / Self Care | Payer: Self-pay | Attending: Emergency Medicine | Admitting: Emergency Medicine

## 2018-12-10 ENCOUNTER — Encounter (HOSPITAL_COMMUNITY): Payer: Self-pay | Admitting: *Deleted

## 2018-12-10 DIAGNOSIS — Z7982 Long term (current) use of aspirin: Secondary | ICD-10-CM | POA: Insufficient documentation

## 2018-12-10 DIAGNOSIS — I714 Abdominal aortic aneurysm, without rupture: Secondary | ICD-10-CM | POA: Insufficient documentation

## 2018-12-10 DIAGNOSIS — Z79899 Other long term (current) drug therapy: Secondary | ICD-10-CM | POA: Insufficient documentation

## 2018-12-10 DIAGNOSIS — I10 Essential (primary) hypertension: Secondary | ICD-10-CM | POA: Insufficient documentation

## 2018-12-10 DIAGNOSIS — M542 Cervicalgia: Secondary | ICD-10-CM | POA: Insufficient documentation

## 2018-12-10 DIAGNOSIS — Z87891 Personal history of nicotine dependence: Secondary | ICD-10-CM | POA: Insufficient documentation

## 2018-12-10 MED ORDER — HYDROCODONE-ACETAMINOPHEN 5-325 MG PO TABS: 1.0000 | ORAL_TABLET | ORAL | 0 refills | Status: DC | PRN

## 2018-12-10 MED ORDER — KETOROLAC TROMETHAMINE 60 MG/2ML IM SOLN
30.0000 mg | Freq: Once | INTRAMUSCULAR | Status: AC
  Administered 2018-12-10: 30 mg via INTRAMUSCULAR
  Filled 2018-12-10: qty 2

## 2018-12-10 MED ORDER — HYDROCODONE-ACETAMINOPHEN 5-325 MG PO TABS
1.0000 | ORAL_TABLET | Freq: Once | ORAL | Status: AC
  Administered 2018-12-10: 1 via ORAL
  Filled 2018-12-10: qty 1

## 2018-12-10 MED ORDER — METHOCARBAMOL 500 MG PO TABS: 500.0000 mg | ORAL_TABLET | Freq: Two times a day (BID) | ORAL | 0 refills | Status: DC

## 2018-12-10 NOTE — ED Triage Notes (Signed)
Pt c/o neck pain that started a few days ago, denies any injury, pain is worse with movement,

## 2018-12-10 NOTE — Discharge Instructions (Signed)
Take Norco as needed for severe pain. Take Robaxin for muscle spasms. These medicines can make you sleepy so do not take before driving Continue Ibuprofen and Tylenol for mild-moderate pain. Use a heating pad on the area Please return if you are worsening

## 2018-12-10 NOTE — ED Provider Notes (Signed)
[facility] EMERGENCY DEPARTMENT Provider Note   CSN: [id number] Arrival date & time: 12/10/18  2215    History   Chief Complaint Chief Complaint  Patient presents with  . Neck Pain    HPI Celesta GentileJuan Bernardy is a 63 y.o. male who presents with neck pain.  Past medical history significant for SVT, abdominal aortic aneurysm, hypertension.  Patient states for the past 2 days he has had constant posterior neck pain. He denies any injury or trauma.  He states he has had this pain before.  [place] and [place] a "shot" in the muscle which got rid of the pain.  He denies any fever, headache, syncope, chest pain, shortness of breath.  He states it feels like muscle pain and he is having difficulty moving his head.  Pain is worse with movement and he is not been able to sleep due to the discomfort.  He has been taking ibuprofen and Tylenol without significant relief so he decided to come to the emergency department for something "stronger"     HPI  Past Medical History:  Diagnosis Date  . AKI (acute kidney injury) (HCC) 12/14/2017  . HTN (hypertension) 12/14/2017  . SVT (supraventricular tachycardia) (HCC) 12/14/2017    Patient Active Problem List   Diagnosis Date Noted  . AAA (abdominal aortic aneurysm) without rupture (HCC) 12/15/2017  . Abnormal US (ultrasound) of abdomen   . SVT (supraventricular tachycardia) (HCC) 12/14/2017  . AKI (acute kidney injury) (HCC) 12/14/2017  . HTN (hypertension) 12/14/2017  . Elevated troponin 12/14/2017    Past Surgical History:  Procedure Laterality Date  . ABDOMINAL SURGERY    . SHOULDER SURGERY Left   . SHOULDER SURGERY    . SVT ABLATION N/A 02/09/2018   Procedure: SVT ABLATION;  Surgeon: Marinus Mawaylor, Gregg W, MD;  [facility];  Service: Cardiovascular;  Laterality: N/A;        Home Medications    Prior to Admission medications   Medication Sig Start Date End Date Taking? Authorizing Provider  amLODipine (NORVASC)  10 MG tablet Take 10 mg by mouth daily. 07/01/18   [provider]  aspirin EC 81 MG EC tablet Take 1 tablet (81 mg total) by mouth daily. 12/16/17   Johnson, Clanford L, MD  ciprofloxacin (CIPRO) 500 MG tablet Take 1 tablet (500 mg total) by mouth 2 (two) times daily. 07/20/18   Triplett, Tammy, PA-C  docusate sodium (COLACE) 100 MG capsule Take 1 capsule (100 mg total) by mouth 2 (two) times daily as needed for mild constipation. 12/15/17   Cleora FleetJohnson, Clanford L, MD  HYDROcodone-acetaminophen (NORCO/VICODIN) 5-325 MG tablet Take one tab po q 4 hrs prn pain 07/20/18   Triplett, Tammy, PA-C  ibuprofen (ADVIL,MOTRIN) 200 MG tablet Take 400 mg by mouth every 6 (six) hours as needed for headache or moderate pain.     [provider]  metoprolol tartrate (LOPRESSOR) 25 MG tablet Take 1 tablet (25 mg total) by mouth 2 (two) times daily. 02/28/18 07/20/18  Laqueta LindenKoneswaran, Suresh A, MD  metroNIDAZOLE (FLAGYL) 500 MG tablet Take 1 tablet (500 mg total) by mouth 2 (two) times daily. 07/20/18   Pauline Ausriplett, Tammy, PA-C    Family History Family History  Problem Relation Age of Onset  . Hypertension Mother   . Heart disease Mother     Social History Social History   Tobacco Use  . Smoking status: Former Smoker    Types: Cigarettes    Last attempt  to quit: 08/29/1993    Years since quitting: 25.2  . Smokeless tobacco: Never Used  Substance Use Topics  . Alcohol use: Yes    Comment: occasionally  . Drug use: Not Currently     Allergies   Patient has no known allergies.   Review of Systems Review of Systems  Constitutional: Negative for fever.  Musculoskeletal: Positive for myalgias and neck pain.  Neurological: Negative for syncope and headaches.     Physical Exam Updated Vital Signs BP (!) 208/136   Pulse 84   Temp 98.4 F (36.9 C) (Oral)   Resp 16   Ht 5\' 5"  (1.651 m)   Wt 99.8 kg   SpO2 94%   BMI 36.61 kg/m   Physical Exam Vitals signs and nursing note reviewed.   Constitutional:      General: He is not in acute distress.    Appearance: Normal appearance. He is well-developed. He is obese. He is not ill-appearing.     Comments: Calm and cooperative  HENT:     Head: Normocephalic and atraumatic.  Eyes:     General: No scleral icterus.       Right eye: No discharge.        Left eye: No discharge.     Conjunctiva/sclera: Conjunctivae normal.     Pupils: Pupils are equal, round, and reactive to light.  Neck:     Musculoskeletal: Decreased range of motion. Spinous process tenderness and muscular tenderness present. No neck rigidity.  Cardiovascular:     Rate and Rhythm: Normal rate.  Pulmonary:     Effort: Pulmonary effort is normal. No respiratory distress.  Abdominal:     General: There is no distension.  Skin:    General: Skin is warm and dry.  Neurological:     Mental Status: He is alert and oriented to person, place, and time.  Psychiatric:        Behavior: Behavior normal.      ED Treatments / Results  Labs (all labs ordered are listed, but only abnormal results are displayed) Labs Reviewed - No data to display  EKG None  Radiology No results found.  Procedures Procedures (including critical care time)  Medications Ordered in ED Medications  ketorolac (TORADOL) injection 30 mg (has no administration in time range)  HYDROcodone-acetaminophen (NORCO/VICODIN) 5-325 MG per tablet 1 tablet (has no administration in time range)     Initial Impression / Assessment and Plan / ED Course  I have reviewed the triage vital signs and the nursing notes.  Pertinent labs & imaging results that were available during my care of the patient were reviewed by me and considered in my medical decision making (see chart for details).  63 year old male presents with posterior neck pain for the past couple of days.  Pain is likely musculoskeletal as it is reproducible with palpation and worse with movement.  He is hypertensive today she is  likely due to pain.  He reports taking his medication today.  There are no red flags on exam.  Doubt dissection, meningitis, other emergent cause of neck pain.  Full range of motion of his upper extremities and is ambulatory without difficulty.  Feel imaging would be helpful today.  He was given IM Toradol and a dose of Norco here.  Given prescription for pain medicine and muscle relaxer and he was encouraged to continue ibuprofen and Tylenol as needed.  Return precautions were discussed.  Final Clinical Impressions(s) / ED Diagnoses   Final diagnoses:  Neck pain    ED Discharge Orders    None       Beryle Quant 12/10/18 2307    Benjiman Core, MD 12/13/18 [phone]

## 2018-12-10 NOTE — ED Notes (Signed)
Pt instructed to follow up with his pcp tomorrow about his blood pressure, pt reports that he takes one tablet bid for his blood pressure,

## 2019-01-16 ENCOUNTER — Emergency Department (HOSPITAL_COMMUNITY)
Admission: EM | Admit: 2019-01-16 | Discharge: 2019-01-16 | Disposition: A | Discharge status: Home / Self Care | Payer: Self-pay | Attending: Emergency Medicine | Admitting: Emergency Medicine

## 2019-01-16 ENCOUNTER — Other Ambulatory Visit: Payer: Self-pay

## 2019-01-16 ENCOUNTER — Encounter (HOSPITAL_COMMUNITY): Payer: Self-pay

## 2019-01-16 ENCOUNTER — Emergency Department (HOSPITAL_COMMUNITY): Payer: Self-pay

## 2019-01-16 DIAGNOSIS — I10 Essential (primary) hypertension: Secondary | ICD-10-CM | POA: Insufficient documentation

## 2019-01-16 DIAGNOSIS — M5030 Other cervical disc degeneration, unspecified cervical region: Secondary | ICD-10-CM | POA: Insufficient documentation

## 2019-01-16 DIAGNOSIS — Z7982 Long term (current) use of aspirin: Secondary | ICD-10-CM | POA: Insufficient documentation

## 2019-01-16 DIAGNOSIS — Z9114 Patient's other noncompliance with medication regimen: Secondary | ICD-10-CM | POA: Insufficient documentation

## 2019-01-16 DIAGNOSIS — Z79899 Other long term (current) drug therapy: Secondary | ICD-10-CM | POA: Insufficient documentation

## 2019-01-16 DIAGNOSIS — Z87891 Personal history of nicotine dependence: Secondary | ICD-10-CM | POA: Insufficient documentation

## 2019-01-16 DIAGNOSIS — R51 Headache: Secondary | ICD-10-CM | POA: Insufficient documentation

## 2019-01-16 HISTORY — DX: Other chronic pain: G89.29

## 2019-01-16 LAB — CBC WITH DIFFERENTIAL/PLATELET
Abs Immature Granulocytes: 0.01 10*3/uL (ref 0.00–0.07)
Basophils Absolute: 0.1 10*3/uL (ref 0.0–0.1)
Basophils Relative: 2 %
Eosinophils Absolute: 0.3 10*3/uL (ref 0.0–0.5)
Eosinophils Relative: 4 %
HCT: 53 % — ABNORMAL HIGH (ref 39.0–52.0)
Hemoglobin: 18.1 g/dL — ABNORMAL HIGH (ref 13.0–17.0)
Immature Granulocytes: 0 %
Lymphocytes Relative: 38 %
Lymphs Abs: 2.7 10*3/uL (ref 0.7–4.0)
MCH: 32.1 pg (ref 26.0–34.0)
MCHC: 34.2 g/dL (ref 30.0–36.0)
MCV: 94 fL (ref 80.0–100.0)
Monocytes Absolute: 0.9 10*3/uL (ref 0.1–1.0)
Monocytes Relative: 13 %
Neutro Abs: 3 10*3/uL (ref 1.7–7.7)
Neutrophils Relative %: 43 %
Platelets: 262 10*3/uL (ref 150–400)
RBC: 5.64 MIL/uL (ref 4.22–5.81)
RDW: 14.2 % (ref 11.5–15.5)
WBC: 7 10*3/uL (ref 4.0–10.5)
nRBC: 0 % (ref 0.0–0.2)

## 2019-01-16 LAB — URINALYSIS, ROUTINE W REFLEX MICROSCOPIC
Bilirubin Urine: NEGATIVE
Glucose, UA: NEGATIVE mg/dL
Hgb urine dipstick: NEGATIVE
Ketones, ur: NEGATIVE mg/dL
Nitrite: POSITIVE — AB
Protein, ur: NEGATIVE mg/dL
Specific Gravity, Urine: 1.009 (ref 1.005–1.030)
pH: 6 (ref 5.0–8.0)

## 2019-01-16 LAB — BASIC METABOLIC PANEL
Anion gap: 9 (ref 5–15)
BUN: 12 mg/dL (ref 8–23)
CO2: 27 mmol/L (ref 22–32)
Calcium: 9.5 mg/dL (ref 8.9–10.3)
Chloride: 102 mmol/L (ref 98–111)
Creatinine, Ser: 0.81 mg/dL (ref 0.61–1.24)
GFR calc Af Amer: >60 mL/min (ref >60)
GFR calc non Af Amer: >60 mL/min (ref >60)
Glucose, Bld: 113 mg/dL — ABNORMAL HIGH (ref 70–99)
Potassium: 4.1 mmol/L (ref 3.5–5.1)
Sodium: 138 mmol/L (ref 135–145)

## 2019-01-16 LAB — TROPONIN I: Troponin I: <0.03 ng/mL (ref <0.03)

## 2019-01-16 MED ORDER — METHOCARBAMOL 500 MG PO TABS: 1000.0000 mg | ORAL_TABLET | Freq: Four times a day (QID) | ORAL | 0 refills | Status: DC | PRN

## 2019-01-16 MED ORDER — ACETAMINOPHEN 325 MG PO TABS
650.0000 mg | ORAL_TABLET | Freq: Once | ORAL | Status: AC
  Administered 2019-01-16: 650 mg via ORAL
  Filled 2019-01-16: qty 2

## 2019-01-16 MED ORDER — AMLODIPINE BESYLATE 5 MG PO TABS
10.0000 mg | ORAL_TABLET | Freq: Once | ORAL | Status: AC
  Administered 2019-01-16: 10 mg via ORAL
  Filled 2019-01-16: qty 2

## 2019-01-16 MED ORDER — AMLODIPINE BESYLATE 10 MG PO TABS: 10.0000 mg | ORAL_TABLET | Freq: Every day | ORAL | 0 refills | Status: DC

## 2019-01-16 NOTE — ED Provider Notes (Signed)
[facility] EMERGENCY DEPARTMENT Provider Note   CSN: [id number] Arrival date & time: 01/16/19  1032    History   Chief Complaint Chief Complaint  Patient presents with   Headache   Hypertension    HPI Celesta GentileJuan Duffin is a 63 y.o. male.      Headache  Hypertension  Associated symptoms include headaches.    Pt was seen at 1100. Per pt, c/o gradual onset and persistence of constant "high blood pressure" for the past 1 year. Has been associated with "headache" which pt states is located in his neck. States he has a hx of chronic neck pain and "was getting shots in the muscle" in [place]. Neck pain worsens with movement and palpation of the muscles. Pt states he was evaluated by his Cards MD for his BP "last year" but has not f/u with Cards MD or PMD for his symptoms. Pt states he "had some refills" of metoprolol 25mg  tabs, and has been taking that twice per day as previously instructed. Pt has otherwise run out of his other BP med. Pt states he was "supposed to" [facility] up with [facility] but they "sent me here to get a CT scan of my head."  Denies fevers, cough, known COVID+ exposure. Denies CP/palpitations, no SOB/cough, no abd pain, no N/V/D, no visual changes, no focal motor weakness, no tingling/numbness in extremities, no ataxia, no slurred speech, no facial droop.       Past Medical History:  Diagnosis Date   AKI (acute kidney injury) (HCC) 12/14/2017   Chronic neck pain    HTN (hypertension) 12/14/2017   SVT (supraventricular tachycardia) (HCC) 12/14/2017    Patient Active Problem List   Diagnosis Date Noted   AAA (abdominal aortic aneurysm) without rupture (HCC) 12/15/2017   Abnormal US (ultrasound) of abdomen    SVT (supraventricular tachycardia) (HCC) 12/14/2017   AKI (acute kidney injury) (HCC) 12/14/2017   HTN (hypertension) 12/14/2017   Elevated troponin 12/14/2017    Past Surgical History:  Procedure Laterality Date   ABDOMINAL SURGERY      SHOULDER SURGERY Left    SHOULDER SURGERY     SVT ABLATION N/A 02/09/2018   Procedure: SVT ABLATION;  Surgeon: Marinus Mawaylor, Gregg W, MD;  Location: [facility] INVASIVE CV LAB;  Service: Cardiovascular;  Laterality: N/A;        Home Medications    Prior to Admission medications   Medication Sig Start Date End Date Taking? Authorizing Provider  amLODipine (NORVASC) 10 MG tablet Take 10 mg by mouth daily. 07/01/18   [provider]  aspirin EC 81 MG EC tablet Take 1 tablet (81 mg total) by mouth daily. 12/16/17   Johnson, Clanford L, MD  ciprofloxacin (CIPRO) 500 MG tablet Take 1 tablet (500 mg total) by mouth 2 (two) times daily. 07/20/18   Triplett, Tammy, PA-C  docusate sodium (COLACE) 100 MG capsule Take 1 capsule (100 mg total) by mouth 2 (two) times daily as needed for mild constipation. 12/15/17   Johnson, Clanford L, MD  HYDROcodone-acetaminophen (NORCO/VICODIN) 5-325 MG tablet Take 1 tablet by mouth every 4 (four) hours as needed. 12/10/18   Bethel BornGekas, Kelly Marie, PA-C  ibuprofen (ADVIL,MOTRIN) 200 MG tablet Take 400 mg by mouth every 6 (six) hours as needed for headache or moderate pain.     [provider]  methocarbamol (ROBAXIN) 500 MG tablet Take 1 tablet (500 mg total) by mouth 2 (two) times daily. 12/10/18   Bethel BornGekas, Kelly Marie, PA-C  metoprolol tartrate (LOPRESSOR)  25 MG tablet Take 1 tablet (25 mg total) by mouth 2 (two) times daily. 02/28/18 07/20/18  Laqueta Linden, MD  metroNIDAZOLE (FLAGYL) 500 MG tablet Take 1 tablet (500 mg total) by mouth 2 (two) times daily. 07/20/18   Pauline Aus, PA-C    Family History Family History  Problem Relation Age of Onset   Hypertension Mother    Heart disease Mother     Social History Social History   Tobacco Use   Smoking status: Former Smoker    Types: Cigarettes    Last attempt to quit: 08/29/1993    Years since quitting: 25.4   Smokeless tobacco: Never Used  Substance Use Topics   Alcohol use: Yes     Comment: occasionally   Drug use: Not Currently     Allergies   Patient has no known allergies.   Review of Systems Review of Systems  Neurological: Positive for headaches.  ROS: Statement: All systems negative except as marked or noted in the HPI; Constitutional: Negative for fever and chills. ; ; Eyes: Negative for eye pain, redness and discharge. ; ; ENMT: Negative for ear pain, hoarseness, nasal congestion, sinus pressure and sore throat. ; ; Cardiovascular: Negative for chest pain, palpitations, diaphoresis, dyspnea and peripheral edema. ; ; Respiratory: Negative for cough, wheezing and stridor. ; ; Gastrointestinal: Negative for nausea, vomiting, diarrhea, abdominal pain, blood in stool, hematemesis, jaundice and rectal bleeding. . ; ; Genitourinary: Negative for dysuria, flank pain and hematuria. ; ; Musculoskeletal: Negative for back pain. +chronic neck pain. Negative for swelling and trauma.; ; Skin: Negative for pruritus, rash, abrasions, blisters, bruising and skin lesion.; ; Neuro:  Negative for lightheadedness and neck stiffness. Negative for weakness, altered level of consciousness, altered mental status, extremity weakness, paresthesias, involuntary movement, seizure and syncope.      Physical Exam Updated Vital Signs BP (!) 149/105    Pulse 73    Temp 98.3 F (36.8 C) (Oral)    Resp 14    Ht  (1.651 m)    Wt 99.8 kg    SpO2 96%    BMI 36.61 kg/m     BP (!) 150/90    Pulse 70    Temp 98.1 F (36.7 C) (Oral)    Resp 15    Ht  (1.651 m)    Wt 99.8 kg    SpO2 98%    BMI 36.61 kg/m    Patient Vitals for the past 24 hrs:  BP Temp Temp src Pulse Resp SpO2 Height Weight  01/16/19 1300 (!) 149/105 -- -- 73 14 96 % -- --  01/16/19 1230 (!) 130/109 -- -- 60 12 97 % -- --  01/16/19 1206 (!) 159/106 -- -- (!) 56 11 97 % -- --  01/16/19 1100 (!) 180/110 -- -- 63 13 95 % -- --  01/16/19 1043 (!) 209/115 98.3 F (36.8 C) Oral (!) 58 20 98 % -- --  01/16/19 1041 -- -- --  -- -- --  (1.651 m) 99.8 kg     Physical Exam 1105: Physical examination:  Nursing notes reviewed; Vital signs and O2 SAT reviewed;  Constitutional: Well developed, Well nourished, Well hydrated, In no acute distress; Head:  Normocephalic, atraumatic; Eyes: EOMI, PERRL, No scleral icterus; ENMT: Mouth and pharynx normal, Mucous membranes moist; Neck: Supple, Full range of motion, No lymphadenopathy. No meningeal signs.; Cardiovascular: Regular rate and rhythm, No gallop; Respiratory: Breath sounds clear & equal bilaterally, No wheezes.  Speaking full sentences with ease, Normal respiratory effort/excursion; Chest: Nontender, Movement normal; Abdomen: Soft, Nontender, Nondistended, Normal bowel sounds; Genitourinary: No CVA tenderness; Spine:  No midline CS, TS, LS tenderness. +TTP bilat hypertonic trapezius muscles. No rash.;; Extremities: Peripheral pulses normal, No tenderness, No edema, No calf edema or asymmetry.; Neuro: AA&Ox3, Major CN grossly intact. No facial droop. Speech clear. No gross focal motor or sensory deficits in extremities. Climbs on and off stretcher easily by himself. Gait steady..; Skin: Color normal, Warm, Dry.   ED Treatments / Results  Labs (all labs ordered are listed, but only abnormal results are displayed)   EKG EKG Interpretation  Date/Time:  Wednesday Jan 16 2019 10:56:20 EDT Ventricular Rate:  58 PR Interval:    QRS Duration: 99 QT Interval:  399 QTC Calculation: 392 R Axis:   26 Text Interpretation:  Sinus rhythm Probable anteroseptal infarct, old Minimal ST depression, inferior leads When compared with ECG of 03/08/2018 and 02/01/2018 No significant change was found Confirmed by Samuel Jester [phone]) on 01/16/2019 11:15:43 AM   Radiology   Procedures Procedures (including critical care time)  Medications Ordered in ED Medications  amLODipine (NORVASC) tablet 10 mg (10 mg Oral Given 01/16/19 1126)  acetaminophen (TYLENOL) tablet 650 mg (650 mg  Oral Given 01/16/19 1247)     Initial Impression / Assessment and Plan / ED Course  I have reviewed the triage vital signs and the nursing notes.  Pertinent labs & imaging results that were available during my care of the patient were reviewed by me and considered in my medical decision making (see chart for details).     MDM Reviewed: previous chart, nursing note and vitals Reviewed previous: labs and ECG Interpretation: labs, ECG, x-ray and CT scan   Results for orders placed or performed during the hospital encounter of 01/16/19  Basic metabolic panel  Result Value Ref Range   Sodium 138 135 - 145 mmol/L   Potassium 4.1 3.5 - 5.1 mmol/L   Chloride 102 98 - 111 mmol/L   CO2 27 22 - 32 mmol/L   Glucose, Bld 113 (H) 70 - 99 mg/dL   BUN 12 8 - 23 mg/dL   Creatinine, Ser 1.91 0.61 - 1.24 mg/dL   Calcium 9.5 8.9 - 47.8 mg/dL   GFR calc non Af Amer >60 >60 mL/min   GFR calc Af Amer >60 >60 mL/min   Anion gap 9 5 - 15  Troponin I - Once  Result Value Ref Range   Troponin I <0.03 <0.03 ng/mL  CBC with Differential  Result Value Ref Range   WBC 7.0 4.0 - 10.5 K/uL   RBC 5.64 4.22 - 5.81 MIL/uL   Hemoglobin 18.1 (H) 13.0 - 17.0 g/dL   HCT 29.5 (H) 62.1 - 30.8 %   MCV 94.0 80.0 - 100.0 fL   MCH 32.1 26.0 - 34.0 pg   MCHC 34.2 30.0 - 36.0 g/dL   RDW 65.7 84.6 - 96.2 %   Platelets 262 150 - 400 K/uL   nRBC 0.0 0.0 - 0.2 %   Neutrophils Relative % 43 %   Neutro Abs 3.0 1.7 - 7.7 K/uL   Lymphocytes Relative 38 %   Lymphs Abs 2.7 0.7 - 4.0 K/uL   Monocytes Relative 13 %   Monocytes Absolute 0.9 0.1 - 1.0 K/uL   Eosinophils Relative 4 %   Eosinophils Absolute 0.3 0.0 - 0.5 K/uL   Basophils Relative 2 %   Basophils Absolute 0.1 0.0 - 0.1  K/uL   Immature Granulocytes 0 %   Abs Immature Granulocytes 0.01 0.00 - 0.07 K/uL  Urinalysis, Routine w reflex microscopic  Result Value Ref Range   Color, Urine YELLOW YELLOW   APPearance CLEAR CLEAR   Specific Gravity, Urine 1.009  1.005 - 1.030   pH 6.0 5.0 - 8.0   Glucose, UA NEGATIVE NEGATIVE mg/dL   Hgb urine dipstick NEGATIVE NEGATIVE   Bilirubin Urine NEGATIVE NEGATIVE   Ketones, ur NEGATIVE NEGATIVE mg/dL   Protein, ur NEGATIVE NEGATIVE mg/dL   Nitrite POSITIVE (A) NEGATIVE   Leukocytes,Ua LARGE (A) NEGATIVE   RBC / HPF 0-5 0 - 5 RBC/hpf   WBC, UA 21-50 0 - 5 WBC/hpf   Bacteria, UA FEW (A) NONE SEEN   Mucus PRESENT    Dg Chest 2 View Result Date: 01/16/2019 CLINICAL DATA:  Hypertension. EXAM: CHEST - 2 VIEW COMPARISON:  Radiograph of Jan 11, 2018. FINDINGS: The heart size and mediastinal contours are within normal limits. Both lungs are clear. The visualized skeletal structures are unremarkable. IMPRESSION: No active cardiopulmonary disease. Electronically Signed   By: Lupita Raider M.D.   On: 01/16/2019 12:10   Ct Head Wo Contrast Result Date: 01/16/2019 CLINICAL DATA:  Posterior head and neck pain.  No known trauma. EXAM: CT HEAD WITHOUT CONTRAST CT CERVICAL SPINE WITHOUT CONTRAST TECHNIQUE: Multidetector CT imaging of the head and cervical spine was performed following the standard protocol without intravenous contrast. Multiplanar CT image reconstructions of the cervical spine were also generated. COMPARISON:  None. FINDINGS: CT HEAD FINDINGS Brain: No evidence of acute infarction, hemorrhage, extra-axial collection, ventriculomegaly, or mass effect. Generalized cerebral atrophy. Periventricular white matter low attenuation likely secondary to microangiopathy. Vascular: Cerebrovascular atherosclerotic calcifications are noted. Skull: Negative for fracture or focal lesion. Sinuses/Orbits: Visualized portions of the orbits are unremarkable. Visualized portions of the paranasal sinuses and mastoid air cells are unremarkable. Other: None. CT CERVICAL SPINE FINDINGS Alignment: Normal. Skull base and vertebrae: No acute fracture. No primary bone lesion or focal pathologic process. Soft tissues and spinal canal: No  prevertebral fluid or swelling. No visible canal hematoma. Disc levels: Degenerative disc disease with disc height loss at C4-5, C5-6, and C6-7. Severe right facet arthropathy at C2-3. Incomplete segmentation of the C3-4 vertebral bodies and posterior elements. Bilateral mild facet arthropathy at C4-5 with right foraminal narrowing. Upper chest: Lung apices are clear. Other: No fluid collection or hematoma. IMPRESSION: 1. No acute intracranial pathology. 2.  No acute osseous injury of the cervical spine. Electronically Signed   By: Elige Ko   On: 01/16/2019 12:03   Ct Cervical Spine Wo Contrast Result Date: 01/16/2019 CLINICAL DATA:  Posterior head and neck pain.  No known trauma. EXAM: CT HEAD WITHOUT CONTRAST CT CERVICAL SPINE WITHOUT CONTRAST TECHNIQUE: Multidetector CT imaging of the head and cervical spine was performed following the standard protocol without intravenous contrast. Multiplanar CT image reconstructions of the cervical spine were also generated. COMPARISON:  None. FINDINGS: CT HEAD FINDINGS Brain: No evidence of acute infarction, hemorrhage, extra-axial collection, ventriculomegaly, or mass effect. Generalized cerebral atrophy. Periventricular white matter low attenuation likely secondary to microangiopathy. Vascular: Cerebrovascular atherosclerotic calcifications are noted. Skull: Negative for fracture or focal lesion. Sinuses/Orbits: Visualized portions of the orbits are unremarkable. Visualized portions of the paranasal sinuses and mastoid air cells are unremarkable. Other: None. CT CERVICAL SPINE FINDINGS Alignment: Normal. Skull base and vertebrae: No acute fracture. No primary bone lesion or focal pathologic process. Soft tissues and spinal canal:  No prevertebral fluid or swelling. No visible canal hematoma. Disc levels: Degenerative disc disease with disc height loss at C4-5, C5-6, and C6-7. Severe right facet arthropathy at C2-3. Incomplete segmentation of the C3-4 vertebral bodies  and posterior elements. Bilateral mild facet arthropathy at C4-5 with right foraminal narrowing. Upper chest: Lung apices are clear. Other: No fluid collection or hematoma. IMPRESSION: 1. No acute intracranial pathology. 2.  No acute osseous injury of the cervical spine. Electronically Signed   By: Elige Ko   On: 01/16/2019 12:03     Jahmire Ruffins was evaluated in Emergency Department on 01/16/2019 for the symptoms described in the history of present illness. He was evaluated in the context of the global COVID-19 pandemic, which necessitated consideration that the patient might be at risk for infection with the SARS-CoV-2 virus that causes COVID-19. Institutional protocols and algorithms that pertain to the evaluation of patients at risk for COVID-19 are in a state of rapid change based on information released by regulatory bodies including the CDC and federal and state organizations. These policies and algorithms were followed during the patient's care in the ED.     1315:  Labs per baseline. Possible UTI on Udip, but pt denies urinary symptoms; UC is pending. Pt given his previous med (norvasc ) by mouth while in the ED without slow improvement in BP. Will not dose previous metoprolol (  PO BID) due to HR 50-60's while in the ED.   T/C returned from Cards Dr. Diona Browner, case discussed, including:  HPI, pertinent PM/SHx, VS/PE, dx testing, ED course and treatment:  Agrees with ED treatment and reasoning regarding not giving metoprolol, have pt continue norvasc  PO daily in addition to the metoprolol at the dose he is taking (  PO BID) due to low HR, f/u office.   1400:  Dx and testing, as well as d/w Cards MD, d/w pt.  Questions answered.  Verb understanding, agreeable to d/c home with outpt f/u.   Final Clinical Impressions(s) / ED Diagnoses   Final diagnoses:  None    ED Discharge Orders    None       Samuel Jester, DO 01/19/19 1645

## 2019-01-16 NOTE — ED Notes (Signed)
Patient reports that he has no urinary symptoms at this time

## 2019-01-16 NOTE — ED Notes (Signed)
Patient roomed from CT  

## 2019-01-16 NOTE — Discharge Instructions (Signed)
Take the new prescriptions as directed. Continue your metoprolol prescriptions as previously directed.  Take your blood pressure only once per day, either in the morning approximately 1 hour after you take your medicine(s) or in the evening before you go to bed, a few times per week.  Always sit quietly for at least 15 minutes before taking your blood pressure.  Keep a diary of your blood pressures to show your doctor at your follow up office visit. Take over the counter tylenol, as directed on packaging, as needed for discomfort. Apply moist heat or ice to the area(s) of discomfort, for 15 minutes at a time, several times per day for the next few days.  Do not fall asleep on a heating or ice pack.  Call your regular medical doctor and your Cardiologist today to schedule a follow up appointment within the next week.  Return to the Emergency Department immediately if worsening.

## 2019-01-16 NOTE — ED Triage Notes (Signed)
Pt reports has htn.  Reports for the past 3 months he's had a headache that varies in intensity and bp has been elevated.  Reports is on metoprolol since last June.  Pt says was here approx 1 month ago for same.

## 2019-01-16 NOTE — ED Notes (Signed)
Patient transported to CT 

## 2019-01-16 NOTE — ED Notes (Signed)
ED Provider at bedside. 

## 2019-01-18 LAB — URINE CULTURE: Culture: >=100000 — AB

## 2019-01-19 ENCOUNTER — Telehealth: Payer: Self-pay | Admitting: Emergency Medicine

## 2019-01-19 NOTE — Telephone Encounter (Signed)
Post ED Visit - Positive Culture Follow-up  Culture report reviewed by antimicrobial stewardship pharmacist: [facility] []  Enzo Bi, Pharm.D. []  Celedonio Miyamoto, Pharm.D., BCPS AQ-ID []  Garvin Fila, Pharm.D., BCPS [x]  Georgina Pillion, Pharm.D., BCPS []  [place], [place].D., BCPS, AAHIVP []  Estella Husk, Pharm.D., BCPS, AAHIVP []  Lysle Pearl, PharmD, BCPS []  Phillips Climes, PharmD, BCPS []  Agapito Games, PharmD, BCPS []  Verlan Friends, PharmD []  Mervyn Gay, PharmD, BCPS []  Vinnie Level, PharmD  [facility] []  Len Childs, PharmD []  Greer Pickerel, PharmD []  Adalberto Cole, PharmD []  Perlie Gold, Rph []  Lonell Face) Jean Rosenthal, PharmD []  Earl Many, PharmD []  Junita Push, PharmD []  Dorna Leitz, PharmD []  Terrilee Files, PharmD []  Lynann Beaver, PharmD []  Keturah Barre, PharmD []  Loralee Pacas, PharmD []  Bernadene Person, PharmD   Positive urine culture Asymptomatic, no further patient follow-up is required at this time.  [name] 01/19/2019, 5:25 PM

## 2019-01-24 ENCOUNTER — Other Ambulatory Visit: Payer: Self-pay | Admitting: Cardiovascular Disease

## 2019-01-24 NOTE — Telephone Encounter (Signed)
° °  1. Which medications need to be refilled? (please list name of each medication and dose if known)  amLODipine (NORVASC) 10 MG tablet    2. Which pharmacy/location (including street and city if local pharmacy) is medication to be sent to? [facility] - [place]   3. Do they need a 30 day or 90 day supply?

## 2019-01-25 MED ORDER — AMLODIPINE BESYLATE 10 MG PO TABS: 10.0000 mg | ORAL_TABLET | Freq: Every day | ORAL | 1 refills | Status: DC

## 2019-01-25 NOTE — Telephone Encounter (Signed)
Refilled amlodipine 

## 2019-03-04 ENCOUNTER — Other Ambulatory Visit: Payer: Self-pay | Admitting: *Deleted

## 2019-03-04 MED ORDER — METOPROLOL TARTRATE 25 MG PO TABS: 25.0000 mg | ORAL_TABLET | Freq: Two times a day (BID) | ORAL | 0 refills | Status: DC

## 2019-03-13 ENCOUNTER — Telehealth: Payer: Self-pay | Admitting: Cardiovascular Disease

## 2019-03-13 NOTE — Telephone Encounter (Signed)

## 2019-03-14 ENCOUNTER — Encounter: Payer: Self-pay | Admitting: Cardiovascular Disease

## 2019-03-14 ENCOUNTER — Other Ambulatory Visit: Payer: Self-pay

## 2019-03-14 ENCOUNTER — Ambulatory Visit (INDEPENDENT_AMBULATORY_CARE_PROVIDER_SITE_OTHER): Payer: Self-pay | Admitting: Cardiovascular Disease

## 2019-03-14 VITALS — BP 130/81 | HR 59 | Ht 65.0 in | Wt 229.2 lb

## 2019-03-14 DIAGNOSIS — I714 Abdominal aortic aneurysm, without rupture, unspecified: Secondary | ICD-10-CM

## 2019-03-14 DIAGNOSIS — E669 Obesity, unspecified: Secondary | ICD-10-CM

## 2019-03-14 DIAGNOSIS — Z9889 Other specified postprocedural states: Secondary | ICD-10-CM

## 2019-03-14 DIAGNOSIS — I119 Hypertensive heart disease without heart failure: Secondary | ICD-10-CM

## 2019-03-14 DIAGNOSIS — I10 Essential (primary) hypertension: Secondary | ICD-10-CM

## 2019-03-14 MED ORDER — AMLODIPINE BESYLATE 10 MG PO TABS: ORAL_TABLET | ORAL | 3 refills | Status: DC

## 2019-03-14 MED ORDER — CARVEDILOL 3.125 MG PO TABS: 3.1250 mg | ORAL_TABLET | Freq: Two times a day (BID) | ORAL | 3 refills | Status: DC

## 2019-03-14 NOTE — Patient Instructions (Addendum)
Medication Instructions:   Stop Lopressor (Metoprolol tart).  Begin Coreg 3.125mg  twice a day - new sent to pharmacy today.   Refill sent to pharmacy for the Amlodipine as well.  Continue all other medications.    Labwork: none  Testing/Procedures: none  Follow-Up: As needed.    Any Other Special Instructions Will Be Listed Below (If Applicable). Referral to Primary Care Physician.    If you need a refill on your cardiac medications before your next appointment, please call your pharmacy.

## 2019-03-14 NOTE — Progress Notes (Signed)
SUBJECTIVE: The patient presents today for an evaluation.  When I last saw him in July 2019 I told him to follow-up as needed.  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 Ladona Ridgelaylor in 2019.  He underwent a low risk nuclear stress test on 01/15/2018.  Echocardiogram on 12/15/2017 demonstrated normal left ventricular systolic and diastolic function, LVEF 60 to 16%65%, with severe LVH.  He was evaluated in [facility] ED [facility] headache and [facility] on 01/16/2019.  Blood pressure was 149/105 initially but have gotten as high as 209/115.  Renal function was normal.  Troponin was normal.  Hemoglobin was elevated at 18.1.  Chest x-ray showed no active cardiopulmonary disease.  Head CT showed no acute intracranial pathology.  He has not been following with a PCP.  He very seldom has upper left-sided chest pains.  He denies exertional dyspnea.  He has had some mild feet swelling with amlodipine but it has not been bothersome.  He denies orthopnea, palpitations, lightheadedness, headaches, and paroxysmal nocturnal dyspnea.  He checks his blood pressure at home and it is in the 115-130/80-95 range.   Review of Systems: As per "subjective", otherwise negative.  No Known Allergies  Current Outpatient Medications  Medication Sig Dispense Refill  . acetaminophen (TYLENOL) 325 MG tablet Take 650 mg by mouth as needed.    Marland Kitchen. amLODipine (NORVASC) 10 MG tablet Take 1 tablet (10 mg total) by mouth daily. (Patient taking differently: Take 10 mg by mouth daily. Takes middle of the day.) 90 tablet 1  . aspirin EC 81 MG EC tablet Take 1 tablet (81 mg total) by mouth daily. (Patient taking differently: Take 81 mg by mouth daily. Takes occasionally.)    . ibuprofen (ADVIL,MOTRIN) 200 MG tablet Take 400 mg by mouth every 6 (six) hours as needed for headache or moderate pain.     . metoprolol tartrate (LOPRESSOR) 25 MG tablet Take 1 tablet (25 mg total) by mouth 2  (two) times daily. 180 tablet 0   No current facility-administered medications for this visit.     Past Medical History:  Diagnosis Date  . AKI (acute kidney injury) (HCC) 12/14/2017  . Chronic neck pain   . HTN (hypertension) 12/14/2017  . SVT (supraventricular tachycardia) (HCC) 12/14/2017    Past Surgical History:  Procedure Laterality Date  . ABDOMINAL SURGERY    . SHOULDER SURGERY Left   . SHOULDER SURGERY    . SVT ABLATION N/A 02/09/2018   Procedure: SVT ABLATION;  Surgeon: Marinus Mawaylor, Gregg W, MD;  [facility];  Service: Cardiovascular;  Laterality: N/A;    Social History   Socioeconomic History  . Marital status: Married    Spouse name: Not on file  . Number of children: Not on file  . Years of education: Not on file  . Highest education level: Not on file  Occupational History  . Not on file  Social Needs  . Financial resource strain: Not on file  . Food insecurity    Worry: Not on file    Inability: Not on file  . Transportation needs    Medical: Not on file    Non-medical: Not on file  Tobacco Use  . Smoking status: Former Smoker    Types: Cigarettes    Quit date: 08/29/1993    Years since quitting: 25.5  . Smokeless tobacco: Never Used  Substance and Sexual Activity  . Alcohol use: Yes  Comment: occasionally  . Drug use: Not Currently  . Sexual activity: Not on file  Lifestyle  . Physical activity    Days per week: Not on file    Minutes per session: Not on file  . Stress: Not on file  Relationships  . Social Herbalist on phone: Not on file    Gets together: Not on file    Attends religious service: Not on file    Active member of club or organization: Not on file    Attends meetings of clubs or organizations: Not on file    Relationship status: Not on file  . Intimate partner violence    Fear of current or ex partner: Not on file    Emotionally abused: Not on file    Physically abused: Not on file    Forced sexual  activity: Not on file  Other Topics Concern  . Not on file  Social History Narrative  . Not on file     Vitals:   03/14/19 1031  BP: 130/81  Pulse: (!) 59  SpO2: 98%  Weight: 229 lb 3.2 oz (104 kg)  Height: 5\' 5"  (1.651 m)    Wt Readings from Last 3 Encounters:  03/14/19 229 lb 3.2 oz (104 kg)  01/16/19 220 lb (99.8 kg)  12/10/18 220 lb (99.8 kg)     PHYSICAL EXAM General: NAD HEENT: Normal. Neck: No JVD, no thyromegaly. Lungs: Clear to auscultation bilaterally with normal respiratory effort. CV: Regular rate and rhythm, normal S1/S2, no S3/S4, no murmur. No pretibial or periankle edema.  No carotid bruit.   Abdomen: Soft, nontender, protuberant.  Neurologic: Alert and oriented.  Psych: Normal affect. Skin: Normal. Musculoskeletal: No gross deformities.    ECG: Reviewed above under Subjective   Labs: Lab Results  Component Value Date/Time   K 4.1 01/16/2019 11:22 AM   BUN 12 01/16/2019 11:22 AM   CREATININE 0.81 01/16/2019 11:22 AM   ALT 23 07/20/2018 08:47 AM   TSH 0.480 12/15/2017 09:10 AM   HGB 18.1 (H) 01/16/2019 11:22 AM     Lipids: Lab Results  Component Value Date/Time   LDLCALC 77 12/15/2017 03:46 AM   CHOL 135 12/15/2017 03:46 AM   TRIG 134 12/15/2017 03:46 AM   HDL 31 (L) 12/15/2017 03:46 AM       ASSESSMENT AND PLAN: 1.  AVNRT status post ablation: Symptomatically stable.  He is on metoprolol 25 mg twice daily.  For better hypertension control, I will switch to carvedilol 3.125 mg twice daily.  2.  Hypertension/hypertensive heart disease: Blood pressure is normal today with occasionally elevated diastolic readings at home.  He is on amlodipine 10 mg and Lopressor 25 mg twice daily. For better hypertension control, I will switch to carvedilol 3.125 mg twice daily.  I will provide 90-day supplies of both carvedilol and amlodipine with several refills.  3.  Abdominal aortic aneurysm: Imaging in 2019 demonstrated a distal aortic aneurysm  measuring 4.7 cm.  This is followed by vascular surgery.  4.  Obesity: He needs significant weight loss.    Disposition: Follow up prn with me.  I have given him a recommendation for a PCP and will try to help arrange this appointment.   Kate Sable, M.D., F.A.C.C.

## 2019-09-04 IMAGING — CR DG CHEST 1V PORT
1 series · 1 of 1 positions shown · non-contrast
Comparison: Chest x-ray 12/14/2017.

CLINICAL DATA: Throat pain.  Elevated heart rate.

EXAM:
PORTABLE CHEST 1 VIEW

[portable]
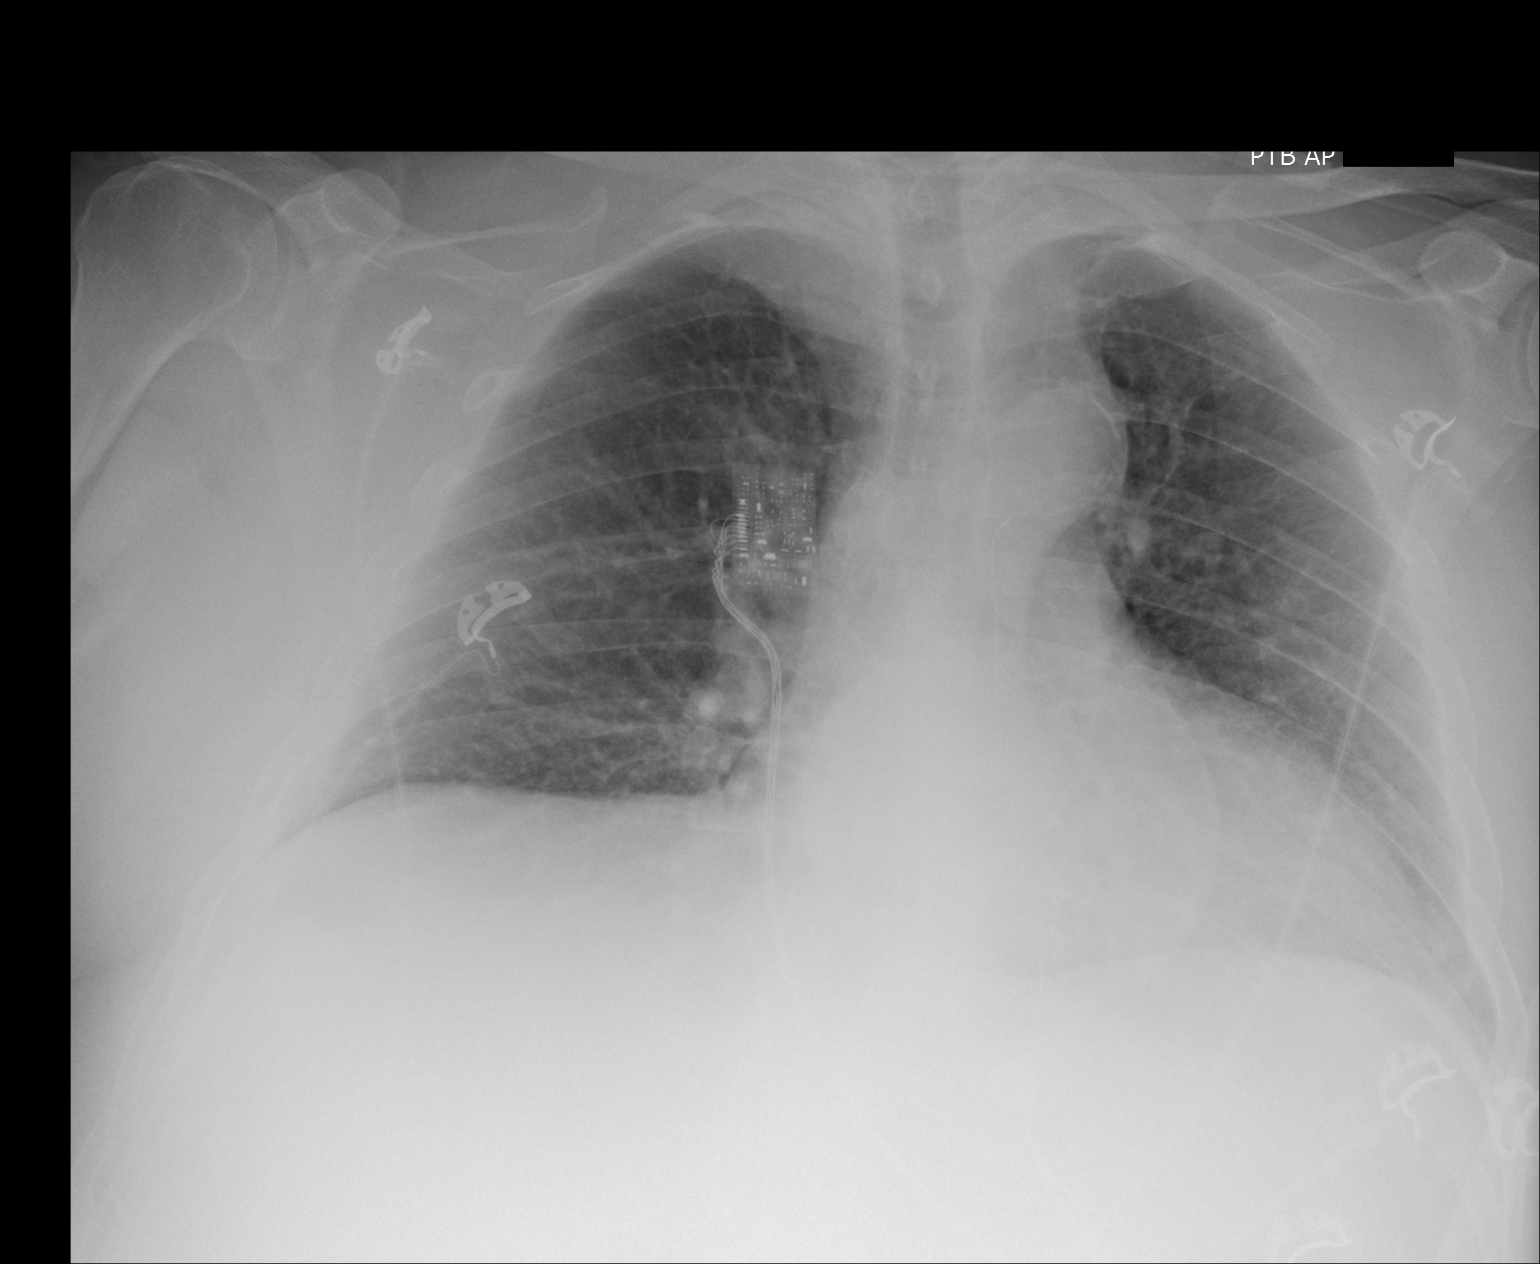

[1 of 1 positions shown; findings below may reference images not displayed]

FINDINGS: Cardiomegaly with normal pulmonary vascularity. Low lung volumes. No
focal infiltrate. No pleural effusion or pneumothorax.
IMPRESSION: 1.  Cardiomegaly with normal pulmonary vascularity.

2.  Low lung volumes.  No acute pulmonary disease.

## 2019-09-17 IMAGING — CR DG CHEST 1V PORT
1 series · 1 of 1 positions shown · non-contrast
Comparison: 12/29/2017

CLINICAL DATA: Chest pain

EXAM:
PORTABLE CHEST 1 VIEW

[portable]
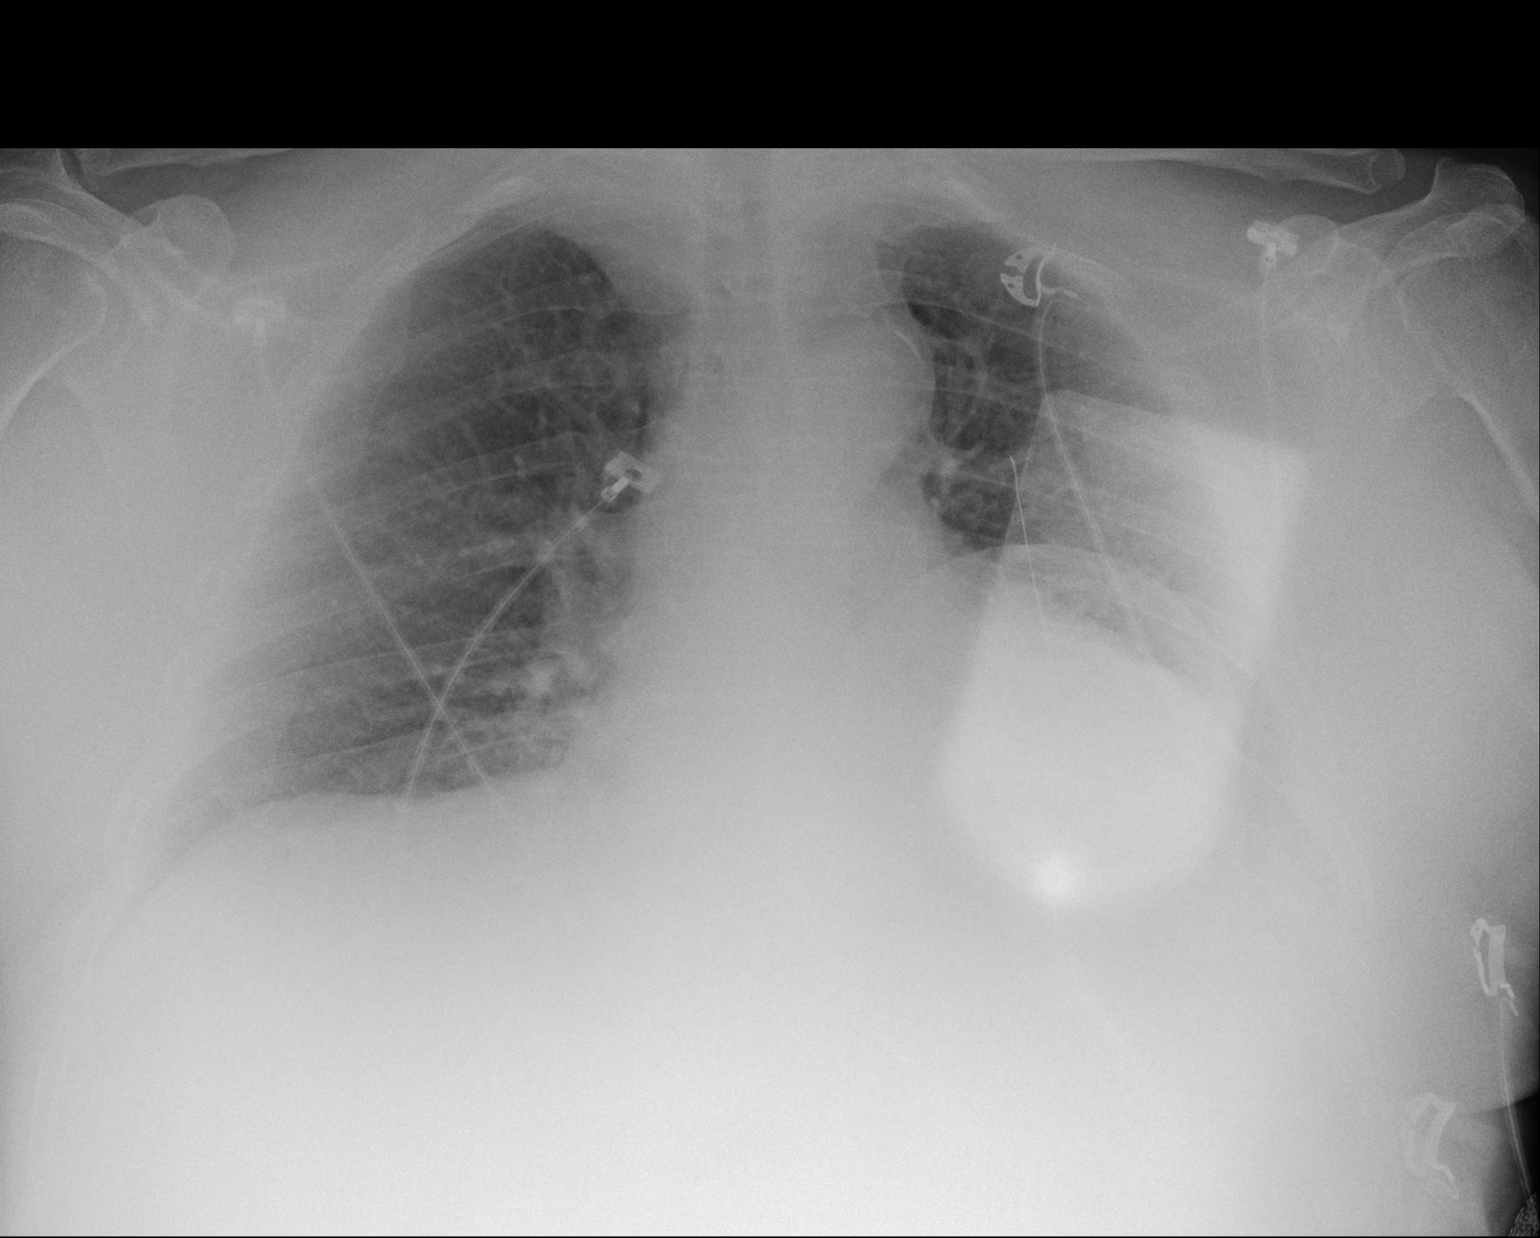

[1 of 1 positions shown; findings below may reference images not displayed]

FINDINGS: There is mild bilateral interstitial thickening. There is no focal
parenchymal opacity. There is no pleural effusion or pneumothorax.
There is stable cardiomegaly. There is mild thoracic aortic
atherosclerosis.

The osseous structures are unremarkable.
IMPRESSION: No active disease.

Stable cardiomegaly.

## 2020-06-02 ENCOUNTER — Other Ambulatory Visit: Payer: Self-pay | Admitting: Cardiology

## 2020-06-02 MED ORDER — AMLODIPINE BESYLATE 10 MG PO TABS: ORAL_TABLET | ORAL | 0 refills | Status: DC

## 2020-06-02 MED ORDER — CARVEDILOL 3.125 MG PO TABS: 3.1250 mg | ORAL_TABLET | Freq: Two times a day (BID) | ORAL | 0 refills | Status: DC

## 2020-06-02 NOTE — Telephone Encounter (Signed)
*  STAT* If patient is at the pharmacy, call can be transferred to refill team.   1. Which medications need to be refilled? (please list name of each medication and dose if known)   amLODipine (NORVASC) 10 MG tablet [[id number]]   carvedilol (COREG) 3.125 MG tablet [[id number]] ENDED   2. Which pharmacy/location (including street and  [facility]  3. Do they need a 30 day or 90 day supply?  90   Pt is a previous [facility] pt- he's out of refills, he's been scheduled for f/u w/ Mardelle Matte on 10/18

## 2020-06-14 NOTE — Progress Notes (Signed)
Cardiology Office Note  Date: 06/15/2020   ID: Dallis Czaja, DOB 1956-05-09, MRN [id number]  PCP:  [facility]  Cardiologist:  Prentice Docker, MD (Inactive) Electrophysiologist:  Lewayne Bunting, MD   Chief Complaint: Follow-up hypertensive heart disease  History of Present Illness: Ricahrd Schwager is a 64 y.o. male with a history of hypertensive heart disease, HTN, SVT, AKI, chronic neck pain.,  Abdominal aortic aneurysm 4.3 cm 06/2018.  Severe diffuse hepatic steatosis.  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 Ladona Ridgel 2019.  Low risk nuclear stress test 01/15/2018.  Echocardiogram 12/15/2017 normal LV systolic function, normal diastolic function, EF 60 to 65% with severe LVH.  Previous evaluation [facility] for headache and hypertension 01/16/2019.  Blood pressure 149/105 but had been higher in the past.  Blood pressure at home had been in the 115-130/80-95 range.  Has been on metoprolol 25 p.o. twice daily.  He is switched to carvedilol 3.125 mg p.o. twice daily.  Continuing amlodipine 10 mg.  Last abdominal aortic aneurysm imaging demonstrated distal aortic aneurysm measuring 4.7 cm.  This was followed by vascular surgery.  He needed weight loss for his obesity.   This is a pleasant 64 year old Hispanic male here for 1 year follow-up with his interpreter.  He denies any acute illnesses or hospitalizations since last visit.  States he has had the Covid vaccine.  Denies any anginal or exertional symptoms, palpitations or arrhythmias, lightheadedness, dizziness, presyncopal or syncopal episode.  States he has occasional transient dizziness which is short-lived when going from a sitting to standing position.  Denies any palpitations or arrhythmias, PND, orthopnea, CVA or TIA-like symptoms, bleeding in stool or urine.  Denies any claudication-like symptoms, DVT or PE-like symptoms, or lower extremity edema.  Diastolic  blood pressure elevated today.  At 98.  He is a former smoker and former drinker and quit approximately 20 years ago.  Past Medical History:  Diagnosis Date  . AKI (acute kidney injury) (HCC) 12/14/2017  . Chronic neck pain   . HTN (hypertension) 12/14/2017  . SVT (supraventricular tachycardia) (HCC) 12/14/2017    Past Surgical History:  Procedure Laterality Date  . ABDOMINAL SURGERY    . SHOULDER SURGERY Left   . SHOULDER SURGERY    . SVT ABLATION N/A 02/09/2018   Procedure: SVT ABLATION;  Surgeon: Marinus Maw, MD;  Location: [facility] INVASIVE CV LAB;  Service: Cardiovascular;  Laterality: N/A;    Current Outpatient Medications  Medication Sig Dispense Refill  . acetaminophen (TYLENOL) 325 MG tablet Take 650 mg by mouth as needed.    Marland Kitchen amLODipine (NORVASC) 10 MG tablet Take one tablet by mouth midday 30 tablet 0  . aspirin EC 81 MG EC tablet Take 1 tablet (81 mg total) by mouth daily. (Patient taking differently: Take 81 mg by mouth daily. Takes occasionally.)    . carvedilol (COREG) 3.125 MG tablet Take 1 tablet (3.125 mg total) by mouth 2 (two) times daily. 60 tablet 0  . ibuprofen (ADVIL,MOTRIN) 200 MG tablet Take 400 mg by mouth every 6 (six) hours as needed for headache or moderate pain.      No current facility-administered medications for this visit.   Allergies:  Patient has no known allergies.   Social History: The patient  reports that he quit smoking about 26 years ago. His smoking use included cigarettes. He has never used smokeless tobacco. He reports current alcohol use. He reports previous drug use.   Family  History: The patient's family history includes Heart disease in his mother; Hypertension in his mother.   ROS:  Please see the history of present illness. Otherwise, complete review of systems is positive for none.  All other systems are reviewed and negative.   Physical Exam: VS:  BP (!) 122/98   Pulse 62   Ht 5\' 6"  (1.676 m)   Wt 226 lb (102.5 kg)   SpO2 98%    BMI 36.48 kg/m , BMI Body mass index is 36.48 kg/m.  Wt Readings from Last 3 Encounters:  06/15/20 226 lb (102.5 kg)  03/14/19 229 lb 3.2 oz (104 kg)  01/16/19 220 lb (99.8 kg)    General: Obese patient appears comfortable at rest. Neck: Supple, no elevated JVP or carotid bruits, no thyromegaly. Lungs: Clear to auscultation, nonlabored breathing at rest. Cardiac: Regular rate and rhythm, no S3 or significant systolic murmur, no pericardial rub. Extremities: No pitting edema, distal pulses 2+. Skin: Warm and dry. Musculoskeletal: No kyphosis. Neuropsychiatric: Alert and oriented x3, affect grossly appropriate.  ECG:  EKG 01/24/2019.  Sinus rhythm rate of 58, probable anterior septal infarct old,  Recent Labwork: No results found for requested labs within last 8760 hours.     Component Value Date/Time   CHOL 135 12/15/2017 0346   TRIG 134 12/15/2017 0346   HDL 31 (L) 12/15/2017 0346   CHOLHDL 4.4 12/15/2017 0346   VLDL 27 12/15/2017 0346   LDLCALC 77 12/15/2017 0346    Other Studies Reviewed Today:  CT abdomen pelvis with contrast 07/21/2018 IMPRESSION: 1. Sigmoid diverticulosis and acute diverticulitis without abscess. 2. 4.3 cm infrarenal abdominal aortic aneurysm. 3. Marked diffuse hepatic steatosis. 4. Moderate to marked prostatic hypertrophy. 5. Small to moderate-sized umbilical hernia containing fat.   NST 01/14/2018 Study Result Narrative & Impression   Blood pressure demonstrated a hypertensive response to exercise.  Horizontal ST segment depression ST segment depression of 1 mm was noted during stress in the II leads, and returning to baseline after less than 1 minute of recovery. There were initial TWI's in III. There were 0.5 mm horizontal ST depressions in III and aVF.  Defect 1: There is a medium defect of mild severity present in the mid inferior and mid inferolateral location. This appears to be due to soft tissue attenuation as regional wall motion is  normal.  This is a low risk study based on perfusion imaging.  Nuclear stress EF: 62%.      CT abdomen pelvis 07/21/2018 IMPRESSION: 1. Sigmoid diverticulosis and acute diverticulitis without abscess. 2. 4.3 cm infrarenal abdominal aortic aneurysm. 3. Marked diffuse hepatic steatosis. 4. Moderate to marked prostatic hypertrophy. 5. Small to moderate-sized umbilical hernia containing fat.    02/09/2018 SVT ABLATION  Conclusion  Conclusion: Successful EP study and catheter ablation of easily inducible AV node reentrant tachycardia with 4 RF energy applications delivered to the very narrow but anterior displaced Koch's triangle.  Following ablation, there is no inducible SVT.  Gregg Taylor,M.D.   Echocardiogram 12/15/2017 Study Conclusions   - Left ventricle: The cavity size was normal. Wall thickness was  increased in a pattern of moderate to severe LVH. Systolic  function was normal. The estimated ejection fraction was in the  range of 60% to 65%. Wall motion was normal; there were no  regional wall motion abnormalities. Left ventricular diastolic  function parameters were normal.  - Aortic valve: Valve area (VTI): 2.64 cm^2. Valve area (Vmax):  2.51 cm^2. Valve area (Vmean): 2.43 cm^2.  -  Technically adequate study  Assessment and Plan:  1. AVNRT (AV nodal re-entry tachycardia) (HCC)   2. Essential hypertension   3. AAA (abdominal aortic aneurysm) without rupture (HCC)   4. Morbid obesity (HCC)    1. AVNRT (AV nodal re-entry tachycardia) (HCC) Denies any palpitations or arrhythmias.  Continue carvedilol 3.125 mg p.o. twice daily.  2. Essential hypertension Blood pressure today 122/98.  Continue amlodipine 10 mg p.o. twice daily.    3. AAA (abdominal aortic aneurysm) without rupture (HCC) Last abdominal CT to check aortic aneurysm was 06/2018.  4.3 cm infrarenal abdominal aortic aneurysm. Please repeat abdominal/pelvic CT to recheck AAA.  4. Morbid  obesity (HCC) BMI 36.48.  Patient has no PCP.  Please get a lipid panel and check hemoglobin A1c.  Advised weight loss and dietary modifications.  Medication Adjustments/Labs and Tests Ordered: Current medicines are reviewed at length with the patient today.  Concerns regarding medicines are outlined above.   Disposition: Follow-up with Dr. Wyline Mood or APP 1 year  Signed, Rennis Harding, NP 06/15/2020 8:35 AM    [facility] [address] [place], [place], [place] [address] Phone: [phone]; Fax: [phone]

## 2020-06-15 ENCOUNTER — Ambulatory Visit (INDEPENDENT_AMBULATORY_CARE_PROVIDER_SITE_OTHER): Payer: Self-pay | Admitting: Family Medicine

## 2020-06-15 ENCOUNTER — Encounter: Payer: Self-pay | Admitting: Family Medicine

## 2020-06-15 VITALS — BP 122/98 | HR 62 | Ht 66.0 in | Wt 226.0 lb

## 2020-06-15 DIAGNOSIS — I714 Abdominal aortic aneurysm, without rupture, unspecified: Secondary | ICD-10-CM

## 2020-06-15 DIAGNOSIS — Z01812 Encounter for preprocedural laboratory examination: Secondary | ICD-10-CM

## 2020-06-15 DIAGNOSIS — E7800 Pure hypercholesterolemia, unspecified: Secondary | ICD-10-CM

## 2020-06-15 DIAGNOSIS — R7309 Other abnormal glucose: Secondary | ICD-10-CM

## 2020-06-15 DIAGNOSIS — I471 Supraventricular tachycardia: Secondary | ICD-10-CM

## 2020-06-15 DIAGNOSIS — I10 Essential (primary) hypertension: Secondary | ICD-10-CM

## 2020-06-15 MED ORDER — AMLODIPINE BESYLATE 10 MG PO TABS: ORAL_TABLET | ORAL | 3 refills | Status: DC

## 2020-06-15 MED ORDER — CARVEDILOL 3.125 MG PO TABS: 3.1250 mg | ORAL_TABLET | Freq: Two times a day (BID) | ORAL | 3 refills | Status: DC

## 2020-06-15 NOTE — Patient Instructions (Addendum)
Medication Instructions:   Amlodipine & Coreg refilled today.  Continue all other current medications.  Labwork:  Lipids, HgbA1C, BMET  - orders given today.   Reminder:  Nothing to eat or drink after 12 midnight prior to labs.  Office will contact with results via phone or letter.    Testing/Procedures:  CTA of abdomen / pelvis  Office will contact with results via phone or letter.    Follow-Up: Your physician wants you to follow up in:  1 year.  You will receive a reminder letter in the mail one-two months in advance.  If you don't receive a letter, please call our office to schedule the follow up appointment.  Any Other Special Instructions Will Be Listed Below (If Applicable).  If you need a refill on your cardiac medications before your next appointment, please call your pharmacy.

## 2020-07-02 ENCOUNTER — Encounter (HOSPITAL_COMMUNITY): Payer: Self-pay | Admitting: Radiology

## 2020-07-02 ENCOUNTER — Ambulatory Visit (HOSPITAL_COMMUNITY)
Admission: RE | Admit: 2020-07-02 | Discharge: 2020-07-02 | Disposition: A | Discharge status: Home / Self Care | Payer: Self-pay | Source: Ambulatory Visit | Attending: Family Medicine | Admitting: Family Medicine

## 2020-07-02 ENCOUNTER — Other Ambulatory Visit: Payer: Self-pay

## 2020-07-02 DIAGNOSIS — I714 Abdominal aortic aneurysm, without rupture, unspecified: Secondary | ICD-10-CM

## 2020-07-02 LAB — POCT I-STAT CREATININE: Creatinine, Ser: 0.8 mg/dL (ref 0.61–1.24)

## 2020-07-02 MED ORDER — IOHEXOL 350 MG/ML SOLN
100.0000 mL | Freq: Once | INTRAVENOUS | Status: AC | PRN
  Administered 2020-07-02: 100 mL via INTRAVENOUS

## 2020-07-03 ENCOUNTER — Telehealth: Payer: Self-pay | Admitting: *Deleted

## 2020-07-03 NOTE — Telephone Encounter (Signed)
Lesle Chris, LPN  54/01/5034 4:65 PM EDT Back to Top    Mailbox full.

## 2020-07-03 NOTE — Telephone Encounter (Signed)
LABS -   Netta Neat., NP  07/01/2020 10:38 PM EDT     Total cholesterol 173, HDL 36, TG 201, LDL 105, glucose 125, creatinine 0.63, GFR 104. Hemoglobin A1c 6%.   He does not speak English and will need an interpreter to convey this information  Please call the patient and let him know the cholesterol numbers are elevated some. Needs to modify his diet to decrease saturated fats and decrease simple carbohydrates. His labs indicate he is pre diabetic and needs to modify his diet as noted above. Also needs to increase exercise and lose weight. This will help with both the diabetes numbers and cholesterol numbers. He does not have a PCP but needs to establish with one.   Netta Neat., NP  07/02/2020 1:03 PM EDT     Not sure why we are receiving the I-stat  Creatinine. But it is normal at 0.80   CT ANGIO -   Netta Neat., NP  07/03/2020 5:02 PM EDT     CT of abdomen shows his aneurysm increased only slightly in size. Currently it is 4.6 cm and previous study showed 4.3. Please call him and let him know we will be doing follow-up CT scans every 6 months to make sure the aneurysm is not getting larger. Tell him this is part of the protocol for monitoring these types of issues. Go ahead and order a follow-up CT abdomen pelvis with and without contrast at 6 months from the date of this scan. Informed him that we have scheduled this appointment. Thank you   Of note he is a [place] speaker only. He will need an interpreter to understand the results. Thank you

## 2020-07-16 NOTE — Telephone Encounter (Signed)
Patient notified.  Copy to health dept.

## 2021-01-05 ENCOUNTER — Telehealth: Payer: Self-pay | Admitting: *Deleted

## 2021-01-05 NOTE — Telephone Encounter (Signed)
Lesle Chris, LPN  8/93/7342 87:68 AM EDT Back to Top     Correction, no copy sent as labs were done at the health department.    Lesle Chris, LPN  09/12/7260 03:55 AM EDT      Notified, copy to pcp.    Netta Neat., NP  01/05/2021 8:05 AM EDT      Please call the patient and tell him all of his lab work looked good. He will need a [place] interpreter. Thank

## 2021-06-16 ENCOUNTER — Ambulatory Visit (INDEPENDENT_AMBULATORY_CARE_PROVIDER_SITE_OTHER): Payer: Medicare Other | Admitting: Cardiology

## 2021-06-16 ENCOUNTER — Encounter: Payer: Self-pay | Admitting: Cardiology

## 2021-06-16 VITALS — BP 150/87 | HR 63 | Ht 66.0 in | Wt 230.8 lb

## 2021-06-16 DIAGNOSIS — I10 Essential (primary) hypertension: Secondary | ICD-10-CM

## 2021-06-16 DIAGNOSIS — I714 Abdominal aortic aneurysm, without rupture, unspecified: Secondary | ICD-10-CM | POA: Diagnosis not present

## 2021-06-16 DIAGNOSIS — Z79899 Other long term (current) drug therapy: Secondary | ICD-10-CM | POA: Diagnosis not present

## 2021-06-16 MED ORDER — AMLODIPINE BESYLATE 10 MG PO TABS
10.0000 mg | ORAL_TABLET | Freq: Every day | ORAL | 3 refills | Status: DC
Start: 2021-06-16 — End: 2022-05-30

## 2021-06-16 MED ORDER — LOSARTAN POTASSIUM 25 MG PO TABS: 25.0000 mg | ORAL_TABLET | Freq: Every day | ORAL | 3 refills | Status: DC

## 2021-06-16 MED ORDER — CARVEDILOL 3.125 MG PO TABS: 3.1250 mg | ORAL_TABLET | Freq: Two times a day (BID) | ORAL | 3 refills | Status: DC

## 2021-06-16 NOTE — Patient Instructions (Addendum)
Medication Instructions:  Your physician has recommended you make the following change in your medication:  Start losartan 25 mg daily Continue other medications the same  Labwork: BMET in 2 weeks Non-fasting  [facility] in [place]  Testing/Procedures: Your physician has requested that you have an abdominal aorta duplex. During this test, an ultrasound is used to evaluate the aorta. Allow 30 minutes for this exam. Do not eat after midnight the day before and avoid carbonated beverages  Follow-Up: Your physician recommends that you schedule a follow-up appointment in: 6 months   Any Other Special Instructions Will Be Listed Below (If Applicable). Please call [phone] to find a family doctor  If you need a refill on your cardiac medications before your next appointment, please call your pharmacy.

## 2021-06-16 NOTE — Progress Notes (Signed)
Clinical Summary Jason Sweeney is a 65 y.o.male former patient of Dr Purvis Sheffield, this is our first visit together. Seen for the following medical problems.  Patient is spanish speaking, seen today with the assistance of a spanish speaking interpreter.    1.PSVT/AVNRT - history of prior ablation in 01/2018 - no recent symptoms - compliant with meds     2. HTN - checks at home, typically 140-150s/80-95 - compliant with meds   3. AAA  06/2020 CT 4.6 cm infrarenal AAA  Past Medical History:  Diagnosis Date   AKI (acute kidney injury) (HCC) 12/14/2017   Chronic neck pain    HTN (hypertension) 12/14/2017   SVT (supraventricular tachycardia) (HCC) 12/14/2017     No Known Allergies   Current Outpatient Medications  Medication Sig Dispense Refill   acetaminophen (TYLENOL) 325 MG tablet Take 650 mg by mouth as needed.     amLODipine (NORVASC) 10 MG tablet Take one tablet by mouth midday 90 tablet 3   aspirin EC 81 MG EC tablet Take 1 tablet (81 mg total) by mouth daily. (Patient taking differently: Take 81 mg by mouth daily. Takes occasionally.)     carvedilol (COREG) 3.125 MG tablet Take 1 tablet (3.125 mg total) by mouth 2 (two) times daily. 180 tablet 3   ibuprofen (ADVIL,MOTRIN) 200 MG tablet Take 400 mg by mouth every 6 (six) hours as needed for headache or moderate pain.      No current facility-administered medications for this visit.     Past Surgical History:  Procedure Laterality Date   ABDOMINAL SURGERY     SHOULDER SURGERY Left    SHOULDER SURGERY     SVT ABLATION N/A 02/09/2018   Procedure: SVT ABLATION;  Surgeon: Marinus Maw, MD;  Location: [facility];  Service: Cardiovascular;  Laterality: N/A;     No Known Allergies    Family History  Problem Relation Age of Onset   Hypertension Mother    Heart disease Mother      Social History Jason Sweeney reports that he quit smoking about 27 years ago. His smoking use included cigarettes. He  has never used smokeless tobacco. Jason Sweeney reports current alcohol use.   Review of Systems CONSTITUTIONAL: No weight loss, fever, chills, weakness or fatigue.  HEENT: Eyes: No visual loss, blurred vision, double vision or yellow sclerae.No hearing loss, sneezing, congestion, runny nose or sore throat.  SKIN: No rash or itching.  CARDIOVASCULAR: per hpi RESPIRATORY: No shortness of breath, cough or sputum.  GASTROINTESTINAL: No anorexia, nausea, vomiting or diarrhea. No abdominal pain or blood.  GENITOURINARY: No burning on urination, no polyuria NEUROLOGICAL: No headache, dizziness, syncope, paralysis, ataxia, numbness or tingling in the extremities. No change in bowel or bladder control.  MUSCULOSKELETAL: No muscle, back pain, joint pain or stiffness.  LYMPHATICS: No enlarged nodes. No history of splenectomy.  PSYCHIATRIC: No history of depression or anxiety.  ENDOCRINOLOGIC: No reports of sweating, cold or heat intolerance. No polyuria or polydipsia.  Marland Kitchen   Physical Examination Today's Vitals   06/16/21 1553  BP: (!) 150/87  Pulse: 63  SpO2: 99%  Weight: 230 lb 12.8 oz (104.7 kg)  Height: 5\' 6"  (1.676 m)   Body mass index is 37.25 kg/m.  Gen: resting comfortably, no acute distress HEENT: no scleral icterus, pupils equal round and reactive, no palptable cervical adenopathy,  CV: RRR, no m/rg, no jvd Resp: Clear to auscultation bilaterally GI: abdomen is soft, non-tender, non-distended, normal bowel  sounds, no hepatosplenomegaly MSK: extremities are warm, no edema.  Skin: warm, no rash Neuro:  no focal deficits Psych: appropriate affect   Diagnostic Studies 11/2017 echo Study Conclusions   - Left ventricle: The cavity size was normal. Wall thickness was    increased in a pattern of moderate to severe LVH. Systolic    function was normal. The estimated ejection fraction was in the    range of 60% to 65%. Wall motion was normal; there were no    regional wall motion  abnormalities. Left ventricular diastolic    function parameters were normal.  - Aortic valve: Valve area (VTI): 2.64 cm^2. Valve area (Vmax):    2.51 cm^2. Valve area (Vmean): 2.43 cm^2.  - Technically adequate study.    2019 nuclear stress Blood pressure demonstrated a hypertensive response to exercise. Horizontal ST segment depression ST segment depression of 1 mm was noted during stress in the II leads, and returning to baseline after less than 1 minute of recovery. There were initial TWI's in III. There were 0.5 mm horizontal ST depressions in III and aVF. Defect 1: There is a medium defect of mild severity present in the mid inferior and mid inferolateral location. This appears to be due to soft tissue attenuation as regional wall motion is normal. This is a low risk study based on perfusion imaging. Nuclear stress EF: 62%.   06/2020 CT IMPRESSION: Redemonstration of infrarenal abdominal aortic aneurysm, estimated 4.6 cm on the current CT. Recommend follow-up every 6 months and vascular consultation. This recommendation follows ACR consensus guidelines: White Paper of the ACR Incidental Findings Committee II on Vascular Findings. J Am Coll Radiol 2013; 10:789-794. Aortic aneurysm NOS (ICD10-I71.9).   Assessment and Plan  AVNRT/PSVT - no symptoms, has done well since ablation -continue beta blocker -EKG today shows nSR  2. HTN - elevated bp - on max norvasc, low normal HR would not increase coreg - start losartan 25mg  daily, check bmet 2 weeks  3. AAA - moderate by last CT, serial would be fine for monitoring going forward - order AAA [place].    F/u 6 months, if establishes with pcp could follow annually   [place], M.D.,

## 2021-06-24 ENCOUNTER — Ambulatory Visit (HOSPITAL_COMMUNITY)
Admission: RE | Admit: 2021-06-24 | Discharge: 2021-06-24 | Disposition: A | Discharge status: Home / Self Care | Payer: Medicare Other | Source: Ambulatory Visit | Attending: Cardiology | Admitting: Cardiology

## 2021-06-24 ENCOUNTER — Other Ambulatory Visit: Payer: Self-pay

## 2021-06-24 ENCOUNTER — Other Ambulatory Visit: Payer: Self-pay | Admitting: Cardiology

## 2021-06-24 DIAGNOSIS — I700 Atherosclerosis of aorta: Secondary | ICD-10-CM | POA: Diagnosis not present

## 2021-06-24 DIAGNOSIS — I714 Abdominal aortic aneurysm, without rupture, unspecified: Secondary | ICD-10-CM | POA: Insufficient documentation

## 2021-06-25 ENCOUNTER — Encounter (HOSPITAL_COMMUNITY): Payer: Self-pay

## 2021-06-25 ENCOUNTER — Emergency Department (HOSPITAL_COMMUNITY)
Admission: EM | Admit: 2021-06-25 | Discharge: 2021-06-25 | Disposition: A | Discharge status: Home / Self Care | Payer: Medicare Other | Attending: Emergency Medicine | Admitting: Emergency Medicine

## 2021-06-25 ENCOUNTER — Emergency Department (HOSPITAL_COMMUNITY): Payer: Medicare Other

## 2021-06-25 ENCOUNTER — Other Ambulatory Visit: Payer: Self-pay

## 2021-06-25 DIAGNOSIS — R300 Dysuria: Secondary | ICD-10-CM | POA: Diagnosis present

## 2021-06-25 DIAGNOSIS — Z7982 Long term (current) use of aspirin: Secondary | ICD-10-CM | POA: Diagnosis not present

## 2021-06-25 DIAGNOSIS — N3000 Acute cystitis without hematuria: Secondary | ICD-10-CM | POA: Insufficient documentation

## 2021-06-25 DIAGNOSIS — Z79899 Other long term (current) drug therapy: Secondary | ICD-10-CM | POA: Insufficient documentation

## 2021-06-25 DIAGNOSIS — J9811 Atelectasis: Secondary | ICD-10-CM | POA: Diagnosis not present

## 2021-06-25 DIAGNOSIS — I10 Essential (primary) hypertension: Secondary | ICD-10-CM | POA: Diagnosis not present

## 2021-06-25 DIAGNOSIS — Z87891 Personal history of nicotine dependence: Secondary | ICD-10-CM | POA: Insufficient documentation

## 2021-06-25 LAB — URINALYSIS, ROUTINE W REFLEX MICROSCOPIC
Bilirubin Urine: NEGATIVE
Glucose, UA: NEGATIVE mg/dL
Hgb urine dipstick: NEGATIVE
Ketones, ur: 5 mg/dL — AB
Nitrite: NEGATIVE
Protein, ur: 30 mg/dL — AB
Specific Gravity, Urine: 1.018 (ref 1.005–1.030)
WBC, UA: >50 WBC/hpf — ABNORMAL HIGH (ref 0–5)
pH: 6 (ref 5.0–8.0)

## 2021-06-25 LAB — CBC WITH DIFFERENTIAL/PLATELET
Abs Immature Granulocytes: 0.06 10*3/uL (ref 0.00–0.07)
Basophils Absolute: 0.1 10*3/uL (ref 0.0–0.1)
Basophils Relative: 1 %
Eosinophils Absolute: 0.1 10*3/uL (ref 0.0–0.5)
Eosinophils Relative: 1 %
HCT: 46 % (ref 39.0–52.0)
Hemoglobin: 16.3 g/dL (ref 13.0–17.0)
Immature Granulocytes: 0 %
Lymphocytes Relative: 15 %
Lymphs Abs: 2.1 10*3/uL (ref 0.7–4.0)
MCH: 33.1 pg (ref 26.0–34.0)
MCHC: 35.4 g/dL (ref 30.0–36.0)
MCV: 93.5 fL (ref 80.0–100.0)
Monocytes Absolute: 2.3 10*3/uL — ABNORMAL HIGH (ref 0.1–1.0)
Monocytes Relative: 16 %
Neutro Abs: 9.4 10*3/uL — ABNORMAL HIGH (ref 1.7–7.7)
Neutrophils Relative %: 67 %
Platelets: 246 10*3/uL (ref 150–400)
RBC: 4.92 MIL/uL (ref 4.22–5.81)
RDW: 13.8 % (ref 11.5–15.5)
WBC: 14 10*3/uL — ABNORMAL HIGH (ref 4.0–10.5)
nRBC: 0 % (ref 0.0–0.2)

## 2021-06-25 LAB — LACTIC ACID, PLASMA: Lactic Acid, Venous: 1.5 mmol/L (ref 0.5–1.9)

## 2021-06-25 LAB — BASIC METABOLIC PANEL
Anion gap: 7 (ref 5–15)
BUN: 13 mg/dL (ref 8–23)
CO2: 26 mmol/L (ref 22–32)
Calcium: 8.9 mg/dL (ref 8.9–10.3)
Chloride: 101 mmol/L (ref 98–111)
Creatinine, Ser: 0.72 mg/dL (ref 0.61–1.24)
GFR, Estimated: >60 mL/min (ref >60)
Glucose, Bld: 163 mg/dL — ABNORMAL HIGH (ref 70–99)
Potassium: 3.6 mmol/L (ref 3.5–5.1)
Sodium: 134 mmol/L — ABNORMAL LOW (ref 135–145)

## 2021-06-25 MED ORDER — CEPHALEXIN 500 MG PO CAPS: 500.0000 mg | ORAL_CAPSULE | Freq: Four times a day (QID) | ORAL | 0 refills | Status: DC

## 2021-06-25 MED ORDER — SODIUM CHLORIDE 0.9 % IV SOLN
2.0000 g | Freq: Once | INTRAVENOUS | Status: AC
  Administered 2021-06-25: 2 g via INTRAVENOUS
  Filled 2021-06-25: qty 20

## 2021-06-25 NOTE — ED Provider Notes (Signed)
[facility] EMERGENCY DEPARTMENT Provider Note   CSN: [id number] Arrival date & time: 06/25/21  0118     History Chief Complaint  Patient presents with   Dysuria    Urgency, frequency, decreased urine output    Jason Sweeney is a 65 y.o. male.  Patient has not been feeling well for several days.  Patient reports that he has been having intermittent chills and body aches.  He has not been able to sleep at night because of the chills and feeling restless.  Patient has noticed that he has had urinary urgency, decreased urine output and burning when he urinates.  He has not taken a temperature at home.  No cough or URI symptoms.      Past Medical History:  Diagnosis Date   AKI (acute kidney injury) (HCC) 12/14/2017   Chronic neck pain    HTN (hypertension) 12/14/2017   SVT (supraventricular tachycardia) (HCC) 12/14/2017    Patient Active Problem List   Diagnosis Date Noted   AAA (abdominal aortic aneurysm) without rupture 12/15/2017   Abnormal [place] (ultrasound) of abdomen    SVT (supraventricular tachycardia) (HCC) 12/14/2017   AKI (acute kidney injury) (HCC) 12/14/2017   HTN (hypertension) 12/14/2017   Elevated troponin 12/14/2017    Past Surgical History:  Procedure Laterality Date   ABDOMINAL SURGERY     SHOULDER SURGERY Left    SHOULDER SURGERY     SVT ABLATION N/A 02/09/2018   Procedure: SVT ABLATION;  Surgeon: Marinus Maw, MD;  Location: [facility];  Service: Cardiovascular;  Laterality: N/A;       Family History  Problem Relation Age of Onset   Hypertension Mother    Heart disease Mother     Social History   Tobacco Use   Smoking status: Former    Types: Cigarettes    Quit date: 08/29/1993    Years since quitting: 27.8   Smokeless tobacco: Never  Vaping Use   Vaping Use: Never used  Substance Use Topics   Alcohol use: Yes    Comment: occasionally   Drug use: Not Currently    Home Medications Prior to Admission medications   Medication  Sig Start Date End Date Taking? Authorizing Provider  cephALEXin (KEFLEX) 500 MG capsule Take 1 capsule (500 mg total) by mouth 4 (four) times daily. 06/25/21  Yes Angeliz Settlemyre, Canary Brim, MD  acetaminophen (TYLENOL) 325 MG tablet Take 650 mg by mouth as needed.    [provider]  amLODipine (NORVASC) 10 MG tablet Take 1 tablet (10 mg total) by mouth daily. Take one tablet by mouth midday 06/16/21   Antoine Poche, MD  aspirin EC 81 MG EC tablet Take 1 tablet (81 mg total) by mouth daily. Patient not taking: Reported on 06/16/2021 12/16/17   Cleora Fleet, MD  carvedilol (COREG) 3.125 MG tablet Take 1 tablet (3.125 mg total) by mouth 2 (two) times daily. 06/16/21   Antoine Poche, MD  ibuprofen (ADVIL,MOTRIN) 200 MG tablet Take 400 mg by mouth every 6 (six) hours as needed for headache or moderate pain.     [provider]  losartan (COZAAR) 25 MG tablet Take 1 tablet (25 mg total) by mouth daily. 06/16/21   Antoine Poche, MD    Allergies    Patient has no known allergies.  Review of Systems   Review of Systems  Constitutional:  Positive for chills.  Genitourinary:  Positive for decreased urine volume, dysuria and urgency.  All other  systems reviewed and are negative.  Physical Exam Updated Vital Signs BP 124/77   Pulse 77   Temp 98.3 F (36.8 C) (Oral)   Resp 16   Ht 5\' 6"  (1.676 m)   Wt 104.6 kg   SpO2 99%   BMI 37.22 kg/m   Physical Exam Vitals and nursing note reviewed.  Constitutional:      General: He is not in acute distress.    Appearance: Normal appearance. He is well-developed.  HENT:     Head: Normocephalic and atraumatic.     Right Ear: Hearing normal.     Left Ear: Hearing normal.     Nose: Nose normal.  Eyes:     Conjunctiva/sclera: Conjunctivae normal.     Pupils: Pupils are equal, round, and reactive to light.  Cardiovascular:     Rate and Rhythm: Regular rhythm.     Heart sounds: S1 normal and S2 normal. No murmur  heard.   No friction rub. No gallop.  Pulmonary:     Effort: Pulmonary effort is normal. No respiratory distress.     Breath sounds: Normal breath sounds.  Chest:     Chest wall: No tenderness.  Abdominal:     General: Bowel sounds are normal.     Palpations: Abdomen is soft.     Tenderness: There is no abdominal tenderness. There is no guarding or rebound. Negative signs include Murphy's sign and McBurney's sign.     Hernia: No hernia is present.  Musculoskeletal:        General: Normal range of motion.     Cervical back: Normal range of motion and neck supple.  Skin:    General: Skin is warm and dry.     Findings: No rash.  Neurological:     Mental Status: He is alert and oriented to person, place, and time.     GCS: GCS eye subscore is 4. GCS verbal subscore is 5. GCS motor subscore is 6.     Cranial Nerves: No cranial nerve deficit.     Sensory: No sensory deficit.     Coordination: Coordination normal.  Psychiatric:        Speech: Speech normal.        Behavior: Behavior normal.        Thought Content: Thought content normal.    ED Results / Procedures / Treatments   Labs (all labs ordered are listed, but only abnormal results are displayed) Labs Reviewed  URINALYSIS, ROUTINE W REFLEX MICROSCOPIC - Abnormal; Notable for the following components:      Result Value   APPearance HAZY (*)    Ketones, ur 5 (*)    Protein, ur 30 (*)    Leukocytes,Ua MODERATE (*)    WBC, UA >50 (*)    Bacteria, UA RARE (*)    All other components within normal limits  CBC WITH DIFFERENTIAL/PLATELET - Abnormal; Notable for the following components:   WBC 14.0 (*)    Neutro Abs 9.4 (*)    Monocytes Absolute 2.3 (*)    All other components within normal limits  BASIC METABOLIC PANEL - Abnormal; Notable for the following components:   Sodium 134 (*)    Glucose, Bld 163 (*)    All other components within normal limits  LACTIC ACID, PLASMA    EKG None  Radiology DG Chest 1  View  Result Date: 06/25/2021 CLINICAL DATA:  Lower bilateral back pain. EXAM: CHEST  1 VIEW COMPARISON:  Jan 15, 2021 FINDINGS: Mild  atelectasis is seen within the bilateral lung bases. There is no evidence of acute infiltrate, pleural effusion or pneumothorax. The heart size and mediastinal contours are within normal limits. The visualized skeletal structures are unremarkable. IMPRESSION: Mild bibasilar atelectasis. Electronically Signed   By: Aram Candela M.D.   On: 06/25/2021 02:29    Procedures Procedures   Medications Ordered in ED Medications  cefTRIAXone (ROCEPHIN) 2 g in sodium chloride 0.9 % 100 mL IVPB (0 g Intravenous Stopped 06/25/21 0406)    ED Course  I have reviewed the triage vital signs and the nursing notes.  Pertinent labs & imaging results that were available during my care of the patient were reviewed by me and considered in my medical decision making (see chart for details).    MDM Rules/Calculators/A&P                           Patient presents to the emergency department for evaluation of fever, chills, urinary urgency and dysuria.  Patient experiencing symptoms for several days.  Patient afebrile at arrival.  Vital signs are normal, no tachycardia, tachypnea, hypotension.  No clinical signs of sepsis.  Patient's lactic acid is normal.  He did have a mild leukocytosis.  Patient with obvious urinary tract infection.  Culture pending.  He appears well.  Vitals remain normal.  Patient administered Rocephin 2 g IV.  Will be discharged with continued antibiotic coverage, given return precautions.  Final Clinical Impression(s) / ED Diagnoses Final diagnoses:  Acute cystitis without hematuria    Rx / DC Orders ED Discharge Orders          Ordered    cephALEXin (KEFLEX) 500 MG capsule  4 times daily        06/25/21 0450             Gilda Crease, MD 06/25/21 [phone]

## 2021-06-25 NOTE — ED Triage Notes (Signed)
Pt presents to ED from home with c/o dysuria, decreased urine output, urgency, and frequency x one week. Pt also reports chills, no fever in triage.

## 2021-07-01 DIAGNOSIS — I10 Essential (primary) hypertension: Secondary | ICD-10-CM | POA: Diagnosis not present

## 2021-07-01 DIAGNOSIS — Z79899 Other long term (current) drug therapy: Secondary | ICD-10-CM | POA: Diagnosis not present

## 2021-07-02 LAB — BASIC METABOLIC PANEL
BUN/Creatinine Ratio: 15 (ref 10–24)
BUN: 12 mg/dL (ref 8–27)
CO2: 25 mmol/L (ref 20–29)
Calcium: 9.8 mg/dL (ref 8.6–10.2)
Chloride: 99 mmol/L (ref 96–106)
Creatinine, Ser: 0.81 mg/dL (ref 0.76–1.27)
Glucose: 146 mg/dL — ABNORMAL HIGH (ref 70–99)
Potassium: 4.7 mmol/L (ref 3.5–5.2)
Sodium: 138 mmol/L (ref 134–144)
eGFR: 98 mL/min/{1.73_m2} (ref >59)

## 2021-07-08 ENCOUNTER — Telehealth: Payer: Self-pay | Admitting: *Deleted

## 2021-07-08 DIAGNOSIS — I714 Abdominal aortic aneurysm, without rupture, unspecified: Secondary | ICD-10-CM

## 2021-07-08 NOTE — Telephone Encounter (Signed)
Patient informed and verbalized understanding of plan. Copy sent to PCP 

## 2021-07-08 NOTE — Telephone Encounter (Signed)
-----   Message from Antoine Poche, MD sent at 06/30/2021  1:03 PM EDT ----- Abdominal aortic aneurysm has enlarged some, still not yet to the degree of concern but now at a level where we should have him establish with vascular to help monitor. Please refer to vascular for AAA   J BrancH MD

## 2021-07-09 NOTE — Telephone Encounter (Signed)
Patient's daughter in law Shanda Bumps called to find out results of his test since her father in law doesn't understand English very well.

## 2021-07-09 NOTE — Telephone Encounter (Signed)
Spoke to DIL and verbalized that she was not on pt's DPR and therefore I would be unable to release any medical information to her. DIL gave me the number to pt's son that is on DPR [place]- [phone]. I left a message for pt son to give office a call back regarding test results.

## 2021-07-29 ENCOUNTER — Other Ambulatory Visit: Payer: Self-pay

## 2021-07-29 ENCOUNTER — Encounter: Payer: Self-pay | Admitting: Vascular Surgery

## 2021-07-29 ENCOUNTER — Ambulatory Visit (INDEPENDENT_AMBULATORY_CARE_PROVIDER_SITE_OTHER): Payer: Medicare Other | Admitting: Vascular Surgery

## 2021-07-29 VITALS — BP 116/76 | HR 66 | Temp 98.4°F | Resp 20 | Ht 66.0 in | Wt 230.0 lb

## 2021-07-29 DIAGNOSIS — I7143 Infrarenal abdominal aortic aneurysm, without rupture: Secondary | ICD-10-CM

## 2021-07-29 NOTE — Progress Notes (Signed)
REASON FOR VISIT:   Abdominal aortic aneurysm.  The consult is requested by Dr. Wyline Mood.  MEDICAL ISSUES:   ABDOMINAL AORTIC ANEURYSM: This patient has a 5.1 cm infrarenal abdominal aortic aneurysm based on the ultrasound on 06/24/2021.  I have again explained that we would normally consider elective repair if the aneurysm reached 5.5 cm in maximum diameter.  The aneurysm has grown slowly.  It was 4.6 cm a year ago.  Fortunately he is not a smoker.  It sounds like his blood pressure has been under good control.  I think we need to follow this closely and not sure why he missed his previous follow-up.  I have recommended a follow-up ultrasound in 6 months and I will see him back at that time.  If the aneurysm approaches 5.5 cm I think we should consider elective repair.  Based on his CT angiogram on 07/02/2020, I think he would be a candidate for endovascular repair.  I will see him back in 6 months.  He knows to call sooner if he has problems.   HPI:   Jason Sweeney is a pleasant 65 y.o. male who I saw over 3 years ago on 12/28/2017 with a 4.7 cm infrarenal abdominal aortic aneurysm.  I explained that we would normally consider elective repair at 5.5 cm.  I was to see him back in 6 months.  It looks like he was lost to follow-up.  Today, the history is obtained through the translation device.  He denies any abdominal pain.  He has had some chronic back pain which is not new.  He quit smoking 25 years ago.  He told me his blood pressures and although it fluctuates some it sounds like his blood pressure has been under good control.  He denies any history of myocardial infarction or history of congestive heart failure.  Has had no recent chest pain or significant dyspnea on exertion.  He tells me that he has had multiple previous operations on his abdomen.  Past Medical History:  Diagnosis Date   AAA (abdominal aortic aneurysm)    AKI (acute kidney injury) (HCC) 12/14/2017   Chronic neck pain     HTN (hypertension) 12/14/2017   SVT (supraventricular tachycardia) (HCC) 12/14/2017    Family History  Problem Relation Age of Onset   Hypertension Mother    Heart disease Mother     SOCIAL HISTORY: Social History   Tobacco Use   Smoking status: Former    Types: Cigarettes    Quit date: 08/29/1993    Years since quitting: 27.9   Smokeless tobacco: Never  Substance Use Topics   Alcohol use: Yes    Comment: occasionally    No Known Allergies  Current Outpatient Medications  Medication Sig Dispense Refill   acetaminophen (TYLENOL) 325 MG tablet Take 650 mg by mouth as needed.     amLODipine (NORVASC) 10 MG tablet Take 1 tablet (10 mg total) by mouth daily. Take one tablet by mouth midday 90 tablet 3   carvedilol (COREG) 3.125 MG tablet Take 1 tablet (3.125 mg total) by mouth 2 (two) times daily. 180 tablet 3   ibuprofen (ADVIL,MOTRIN) 200 MG tablet Take 400 mg by mouth every 6 (six) hours as needed for headache or moderate pain.      losartan (COZAAR) 25 MG tablet Take 1 tablet (25 mg total) by mouth daily. 90 tablet 3   aspirin EC 81 MG EC tablet Take 1 tablet (81 mg total) by mouth daily. (  Patient not taking: Reported on 06/16/2021)     No current facility-administered medications for this visit.    REVIEW OF SYSTEMS:  [X]  denotes positive finding, [ ]  denotes negative finding Cardiac  Comments:  Chest pain or chest pressure:    Shortness of breath upon exertion:    Short of breath when lying flat:    Irregular heart rhythm:        Vascular    Pain in calf, thigh, or hip brought on by ambulation:    Pain in feet at night that wakes you up from your sleep:     Blood clot in your veins:    Leg swelling:  x       Pulmonary    Oxygen at home:    Productive cough:     Wheezing:         Neurologic    Sudden weakness in arms or legs:     Sudden numbness in arms or legs:     Sudden onset of difficulty speaking or slurred speech:    Temporary loss of vision in one  eye:     Problems with dizziness:         Gastrointestinal    Blood in stool:     Vomited blood:         Genitourinary    Burning when urinating:     Blood in urine:        Psychiatric    Major depression:         Hematologic    Bleeding problems:    Problems with blood clotting too easily:        Skin    Rashes or ulcers:        Constitutional    Fever or chills:     PHYSICAL EXAM:   Vitals:   07/29/21 0825  BP: 116/76  Pulse: 66  Resp: 20  Temp: 98.4 F (36.9 C)  SpO2: 96%  Weight: 230 lb (104.3 kg)  Height: 5\' 6"  (1.676 m)    GENERAL: The patient is a well-nourished male, in no acute distress. The vital signs are documented above. CARDIAC: There is a regular rate and rhythm.  VASCULAR: I do not detect carotid bruits. He has palpable femoral, popliteal, dorsalis pedis, and posterior tibial pulses bilaterally. He has no significant lower extremity swelling. PULMONARY: There is good air exchange bilaterally without wheezing or rales. ABDOMEN: Soft and non-tender with normal pitched bowel sounds.  His aneurysm is palpable and nontender. MUSCULOSKELETAL: There are no major deformities or cyanosis. NEUROLOGIC: No focal weakness or paresthesias are detected. SKIN: There are no ulcers or rashes noted. PSYCHIATRIC: The patient has a normal affect.  DATA:    CT ANGIO ABDOMEN PELVIS: I reviewed his CT angiogram from 07/02/2020.  At that time the maximum diameter of his aneurysm was 4.6 cm.  Based on my review of his images I think he would be a candidate for endovascular repair if the aneurysm enlarged to 5.5 cm.  ULTRASOUND ABDOMINAL AORTIC ANEURYSM: I have reviewed the ultrasound of the abdominal aortic aneurysm that was done on 06/24/2021.  The maximum diameter of his aneurysm was 5.1 cm which increased slightly from 4.6 cm a year ago.  The right common iliac artery measures 1.4 cm in maximum diameter.  The left common iliac artery measures 1.7 cm in maximum  diameter.  LABS: I reviewed his labs from 07/01/2021.  His creatinine was 0.81.  13/11/2019 [facility] [phone]

## 2021-07-30 ENCOUNTER — Other Ambulatory Visit: Payer: Self-pay

## 2021-07-30 DIAGNOSIS — I7143 Infrarenal abdominal aortic aneurysm, without rupture: Secondary | ICD-10-CM

## 2021-08-05 ENCOUNTER — Ambulatory Visit: Payer: Medicare Other | Admitting: Vascular Surgery

## 2021-12-02 ENCOUNTER — Ambulatory Visit: Payer: Medicare Other | Admitting: Vascular Surgery

## 2022-05-30 ENCOUNTER — Encounter: Payer: Self-pay | Admitting: *Deleted

## 2022-05-30 ENCOUNTER — Ambulatory Visit: Payer: Medicare Other | Attending: Physician Assistant | Admitting: Physician Assistant

## 2022-05-30 ENCOUNTER — Encounter: Payer: Self-pay | Admitting: Physician Assistant

## 2022-05-30 VITALS — BP 136/84 | HR 68 | Ht 65.0 in | Wt 225.0 lb

## 2022-05-30 DIAGNOSIS — I4719 Other supraventricular tachycardia: Secondary | ICD-10-CM

## 2022-05-30 DIAGNOSIS — R072 Precordial pain: Secondary | ICD-10-CM | POA: Diagnosis not present

## 2022-05-30 DIAGNOSIS — E7800 Pure hypercholesterolemia, unspecified: Secondary | ICD-10-CM

## 2022-05-30 DIAGNOSIS — Z79899 Other long term (current) drug therapy: Secondary | ICD-10-CM | POA: Diagnosis not present

## 2022-05-30 DIAGNOSIS — I10 Essential (primary) hypertension: Secondary | ICD-10-CM | POA: Diagnosis not present

## 2022-05-30 MED ORDER — AMLODIPINE BESYLATE 10 MG PO TABS: 10.0000 mg | ORAL_TABLET | Freq: Every day | ORAL | 0 refills | Status: DC

## 2022-05-30 MED ORDER — LOSARTAN POTASSIUM 25 MG PO TABS: 25.0000 mg | ORAL_TABLET | Freq: Every day | ORAL | 0 refills | Status: AC

## 2022-05-30 MED ORDER — CARVEDILOL 3.125 MG PO TABS: 3.1250 mg | ORAL_TABLET | Freq: Two times a day (BID) | ORAL | 0 refills | Status: AC

## 2022-05-30 NOTE — Patient Instructions (Addendum)
Medication Instructions:  Your physician recommends that you continue on your current medications as directed. Please refer to the Current Medication list given to you today.  *If you need a refill on your cardiac medications before your next appointment, please call your pharmacy*   Lab Work: Your physician recommends that you return for lab work in: On the day you come for stress test.   If you have labs (blood work) drawn today and your tests are completely normal, you will receive your results only by: [place] (if you have MyChart) OR A paper copy in the mail If you have any lab test that is abnormal or we need to change your treatment, we will call you to review the results.   Testing/Procedures: Your physician has requested that you have en exercise stress myoview. For further information please visit [email or web address]. Please follow instruction sheet, as given.    Follow-Up: At [facility], you and your health needs are our priority.  As part of our continuing mission to provide you with exceptional heart care, we have created designated Provider Care Teams.  These Care Teams include your primary Cardiologist (physician) and Advanced Practice Providers (APPs -  Physician Assistants and Nurse Practitioners) who all work together to provide you with the care you need, when you need it.  We recommend signing up for the patient portal called "MyChart".  Sign up information is provided on this After Visit Summary.  MyChart is used to connect with patients for Virtual Visits (Telemedicine).  Patients are able to view lab/test results, encounter notes, upcoming appointments, etc.  Non-urgent messages can be sent to your provider as well.   To learn more about what you can do with MyChart, go to [email or web address].    Your next appointment:   1 year(s)  The format for your next appointment:   In Person  Provider:   Carlyle Dolly, MD    Other  Instructions  Your physician recommends that you schedule a follow-up appointment with Dr. Doren Custard on Oct. 26 at 8:00 am   Thank you for choosing [facility]!    Important Information About Sugar

## 2022-05-30 NOTE — Progress Notes (Signed)
Cardiology Office Note:    Date:  05/30/2022   ID:  Drury Gamarra, DOB [birth year], MRN [id number]  PCP:  [facility], [facility] Cardiologist:  Carlyle Dolly, MD Electrophysiologist:  Cristopher Peru, MD     Referring MD: [facility], Andre Lefort*   Chief Complaint:  No chief complaint on file.     History of Present Illness:   Jason Sweeney is a 66 y.o. 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 Cornelia Copa repair when it gets to 5.5 cm-was to f/u in 6 months.    Patient says for 4 months he had a chest tightness when walking around. It eases quickly when he stops. Has occurred twice. Walks 1/2 mile a day sometimes. 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 Had some dizziness last week while working when bending over and standing up. No chest tightness while working last week. Just got back from [place] last week. Going to [place] in Dec for 3-4 months. Asking to buy a whole year of meds because he goes back and forth the [place] frequently.    Past Medical History:  Diagnosis Date   AAA (abdominal aortic aneurysm)    AKI (acute kidney injury) ([place]) 12/14/2017   Chronic neck pain    HTN (hypertension) 12/14/2017   SVT (supraventricular tachycardia) ([place]) 12/14/2017   Current Medications: Current Meds  Medication Sig   acetaminophen (TYLENOL) 325 MG tablet Take 650 mg by mouth as needed.   amLODipine (NORVASC) 10 MG tablet Take 1 tablet (10 mg total) by mouth daily. Take one tablet by mouth midday   aspirin EC 81 MG EC tablet Take 1 tablet (81 mg total) by mouth daily.   carvedilol (COREG) 3.125 MG tablet Take 1 tablet (3.125 mg total) by mouth 2 (two) times daily.   ibuprofen (ADVIL,MOTRIN) 200 MG tablet Take 400 mg by mouth every 6 (six) hours as needed for headache or moderate pain.    losartan (COZAAR) 25 MG tablet Take 1 tablet (25 mg total) by mouth daily.     Allergies:   Patient has no known allergies.   Social History   Tobacco Use   Smoking status: Former    Types: Cigarettes    Quit date: 08/29/1993    Years since quitting: 28.7   Smokeless tobacco: Never  Vaping Use   Vaping Use: Never used  Substance Use Topics   Alcohol use: Yes    Comment: occasionally   Drug use: Not Currently    Family Hx: The patient's family history includes Heart disease in his mother; Hypertension in his mother.  ROS   EKGs/Labs/Other Test Reviewed:    EKG:  EKG is   ordered today.  The ekg ordered today demonstrates NSR with poor ant R wave progression  Recent Labs: 06/25/2021: Hemoglobin 16.3; Platelets 246 07/01/2021: BUN 12; Creatinine, Ser 0.81; Potassium 4.7; Sodium 138   Recent Lipid Panel No results for input(s): "CHOL", "TRIG", "HDL", "VLDL", "LDLCALC", "LDLDIRECT" in the last 8760 hours.   Prior CV Studies:   Abd [place] 05/2021 IMPRESSION: Evidence of enlargement of abdominal aortic aneurysm which now measures approximately 5.1 cm in greatest diameter by ultrasound estimation. Given maximal diameter now of 5 cm or greater, referral to Vascular Surgery is recommended for evaluation of treatment options.   Recommend follow-up every 6 months and vascular consultation.   Reference: J Am Coll Radiol [id number].     Electronically  Signed   By: Aletta Edouard M.D.   On: 06/25/2021 08:15    11/2017 echo Study Conclusions   - Left ventricle: The cavity size was normal. Wall thickness was    increased in a pattern of moderate to severe LVH. Systolic    function was normal. The estimated ejection fraction was in the    range of 60% to 65%. Wall motion was normal; there were no    regional wall motion abnormalities. Left ventricular diastolic    function parameters were normal.  - Aortic valve: Valve area (VTI): 2.64 cm^2. Valve area (Vmax):    2.51 cm^2. Valve area (Vmean): 2.43 cm^2.  - Technically adequate study.      2019  nuclear stress Blood pressure demonstrated a hypertensive response to exercise. Horizontal ST segment depression ST segment depression of 1 mm was noted during stress in the II leads, and returning to baseline after less than 1 minute of recovery. There were initial TWI's in III. There were 0.5 mm horizontal ST depressions in III and aVF. Defect 1: There is a medium defect of mild severity present in the mid inferior and mid inferolateral location. This appears to be due to soft tissue attenuation as regional wall motion is normal. This is a low risk study based on perfusion imaging. Nuclear stress EF: 62%.     06/2020 CT IMPRESSION: Redemonstration of infrarenal abdominal aortic aneurysm, estimated 4.6 cm on the current CT. Recommend follow-up every 6 months and vascular consultation. This recommendation follows ACR consensus guidelines: White Paper of the ACR Incidental Findings Committee II on Vascular Findings. J Am Coll Radiol 2013; 10:789-794. Aortic aneurysm NOS (ICD10-I71.9).      Risk Assessment/Calculations/Metrics:              Physical Exam:    VS:  BP 136/84   Pulse 68   Ht 5\' 5"  (1.651 m)   Wt 225 lb (102.1 kg)   SpO2 98%   BMI 37.44 kg/m     Wt Readings from Last 3 Encounters:  05/30/22 225 lb (102.1 kg)  07/29/21 230 lb (104.3 kg)  06/25/21 230 lb 9.6 oz (104.6 kg)    Physical Exam  GEN: Obese, in no acute distress  Neck: no JVD, carotid bruits, or masses Cardiac:RRR; no murmurs, rubs, or gallops  Respiratory:  clear to auscultation bilaterally, normal work of breathing GI: soft, nontender, nondistended, + BS Ext: without cyanosis, clubbing, or edema, Good distal pulses bilaterally Neuro:  Alert and Oriented x 3,  Psych: euthymic mood, full affect       ASSESSMENT & PLAN:   No problem-specific Assessment & Plan notes found for this encounter.  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  Will order exercise myoview.  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 Siri Cole scheduled him to be seen this month for f/u.  HLD-check labs       Shared Decision Making/Informed Consent The risks [chest pain, shortness of breath, cardiac arrhythmias, dizziness, blood pressure fluctuations, myocardial infarction, stroke/transient ischemic attack, nausea, vomiting, allergic reaction, radiation exposure, metallic taste sensation and life-threatening complications (estimated to be 1 in 10,000)], benefits (risk stratification, diagnosing coronary artery disease, treatment guidance) and alternatives of a nuclear stress test were discussed in detail with Jason Sweeney and he agrees to proceed.   Dispo:  No  follow-ups on file.   Medication Adjustments/Labs and Tests Ordered: Current medicines are reviewed at length with the patient today.  Concerns regarding medicines are outlined above.  Tests Ordered: Orders Placed This Encounter  Procedures   NM Myocar Multi W/Spect W/Wall Motion / EF   Comprehensive Metabolic Panel (CMET)   CBC   TSH   Lipid panel   Medication Changes: No orders of the defined types were placed in this encounter.  Signed, Ermalinda Barrios, PA-C  05/30/2022 12:31 PM    [address], [place], [place]  [address] Phone: [phone]; Fax: [phone]

## 2022-05-30 NOTE — Addendum Note (Signed)
Addended by: Levonne Hubert on: 05/30/2022 02:07 PM   Modules accepted: Orders

## 2022-06-06 ENCOUNTER — Encounter (HOSPITAL_COMMUNITY)
Admission: RE | Admit: 2022-06-06 | Discharge: 2022-06-06 | Disposition: A | Discharge status: Home / Self Care | Payer: Medicare Other | Source: Ambulatory Visit | Attending: Physician Assistant | Admitting: Physician Assistant

## 2022-06-06 ENCOUNTER — Ambulatory Visit (HOSPITAL_COMMUNITY)
Admission: RE | Admit: 2022-06-06 | Discharge: 2022-06-06 | Disposition: A | Discharge status: Home / Self Care | Payer: Medicare Other | Source: Ambulatory Visit | Attending: Cardiology | Admitting: Cardiology

## 2022-06-06 DIAGNOSIS — R072 Precordial pain: Secondary | ICD-10-CM | POA: Insufficient documentation

## 2022-06-06 LAB — NM MYOCAR MULTI W/SPECT W/WALL MOTION / EF
LV dias vol: 97 mL (ref 62–150)
LV sys vol: 39 mL
Nuc Stress EF: 60 %
Peak HR: 80 {beats}/min
RATE: 0.4
Rest HR: 54 {beats}/min
Rest Nuclear Isotope Dose: 10.5 mCi
SDS: 0
SRS: 2
SSS: 2
ST Depression (mm): 0 mm
Stress Nuclear Isotope Dose: 30 mCi
TID: 1.18

## 2022-06-06 MED ORDER — REGADENOSON 0.4 MG/5ML IV SOLN
INTRAVENOUS | Status: AC
  Filled 2022-06-06: qty 5

## 2022-06-06 MED ORDER — TECHNETIUM TC 99M TETROFOSMIN IV KIT
30.0000 | PACK | Freq: Once | INTRAVENOUS | Status: AC | PRN
  Administered 2022-06-06: 30 via INTRAVENOUS

## 2022-06-06 MED ORDER — TECHNETIUM TC 99M TETROFOSMIN IV KIT
10.0000 | PACK | Freq: Once | INTRAVENOUS | Status: AC | PRN
  Administered 2022-06-06: 10.5 via INTRAVENOUS

## 2022-06-06 MED ORDER — SODIUM CHLORIDE FLUSH 0.9 % IV SOLN
INTRAVENOUS | Status: AC
  Filled 2022-06-06: qty 10

## 2022-06-23 ENCOUNTER — Encounter: Payer: Self-pay | Admitting: Vascular Surgery

## 2022-06-23 ENCOUNTER — Ambulatory Visit (INDEPENDENT_AMBULATORY_CARE_PROVIDER_SITE_OTHER): Payer: Medicare Other | Admitting: Vascular Surgery

## 2022-06-23 ENCOUNTER — Ambulatory Visit (HOSPITAL_COMMUNITY)
Admission: RE | Admit: 2022-06-23 | Discharge: 2022-06-23 | Disposition: A | Discharge status: Home / Self Care | Payer: Medicare Other | Source: Ambulatory Visit | Attending: Vascular Surgery | Admitting: Vascular Surgery

## 2022-06-23 VITALS — BP 130/79 | HR 55 | Temp 98.2°F | Resp 20 | Ht 65.0 in | Wt 224.0 lb

## 2022-06-23 DIAGNOSIS — I7143 Infrarenal abdominal aortic aneurysm, without rupture: Secondary | ICD-10-CM | POA: Insufficient documentation

## 2022-06-23 NOTE — Progress Notes (Signed)
REASON FOR VISIT:   Follow-up of abdominal aortic aneurysm.  MEDICAL ISSUES:   ABDOMINAL AORTIC ANEURYSM: This patient's aneurysm has increased slightly in size from 5.1 cm in October 20 22 to 5.36 cm today.  Thus there is been only slight growth.  However, it is approaching 5.5 cm.  I explained that we would normally consider elective repair of an aneurysm and a normal risk patient at 5.5 cm.  I recommended a follow-up study in 6 months.  Given that the aneurysm is approaching 5.5 cm I think we will get a CT angio to help determine if he is a candidate for an endovascular approach if the aneurysm continues to enlarge.  I will see him back at that time.  With respect to his back pain I encouraged him to follow-up with his primary care physician as he may need consultation with one of the back surgeons.  HPI:   Jason Sweeney is a pleasant 66 y.o. male who I saw in consultation with an abdominal aortic aneurysm on 07/29/2021.  It was 5.1 cm based on an ultrasound on 06/24/2021.  I explained that we would consider elective repair if the aneurysm reached 5.5 cm in maximum diameter.  He was not a smoker.  His blood pressure was under good control.  Based on a previous CT scan in November 2021 it looks like at that time he would be a candidate for endovascular repair.  He comes in for 38-month follow-up visit.  On my history today, the patient does have some chronic back pain with some radiation around to the lateral aspect of the left thigh.  He describes some burning pain in his left thigh.  He denies any abdominal pain.  He has had back x-rays in [place] in the past.  I do not get any history of claudication or rest pain.  He is not a smoker.  He quit in 1995.  Past Medical History:  Diagnosis Date   AAA (abdominal aortic aneurysm) ([facility])    AKI (acute kidney injury) ([facility]) 12/14/2017   Chronic neck pain    HTN (hypertension) 12/14/2017   SVT (supraventricular tachycardia) 12/14/2017     Family History  Problem Relation Age of Onset   Hypertension Mother    Heart disease Mother     SOCIAL HISTORY: Social History   Tobacco Use   Smoking status: Former    Types: Cigarettes    Quit date: 08/29/1993    Years since quitting: 28.8   Smokeless tobacco: Never  Substance Use Topics   Alcohol use: Yes    Comment: occasionally    No Known Allergies  Current Outpatient Medications  Medication Sig Dispense Refill   acetaminophen (TYLENOL) 325 MG tablet Take 650 mg by mouth as needed.     amLODipine (NORVASC) 10 MG tablet Take 1 tablet (10 mg total) by mouth daily. Take one tablet by mouth midday 365 tablet 0   aspirin EC 81 MG EC tablet Take 1 tablet (81 mg total) by mouth daily.     carvedilol (COREG) 3.125 MG tablet Take 1 tablet (3.125 mg total) by mouth 2 (two) times daily. 540 tablet 0   ibuprofen (ADVIL,MOTRIN) 200 MG tablet Take 400 mg by mouth every 6 (six) hours as needed for headache or moderate pain.      losartan (COZAAR) 25 MG tablet Take 1 tablet (25 mg total) by mouth daily. 365 tablet 0   No current facility-administered medications for this visit.  REVIEW OF SYSTEMS:  [X]  denotes positive finding, [ ]  denotes negative finding Cardiac  Comments:  Chest pain or chest pressure:    Shortness of breath upon exertion:    Short of breath when lying flat:    Irregular heart rhythm:        Vascular    Pain in calf, thigh, or hip brought on by ambulation:    Pain in feet at night that wakes you up from your sleep:     Blood clot in your veins:    Leg swelling:         Pulmonary    Oxygen at home:    Productive cough:     Wheezing:         Neurologic    Sudden weakness in arms or legs:     Sudden numbness in arms or legs:     Sudden onset of difficulty speaking or slurred speech:    Temporary loss of vision in one eye:     Problems with dizziness:         Gastrointestinal    Blood in stool:     Vomited blood:         Genitourinary     Burning when urinating:     Blood in urine:        Psychiatric    Major depression:         Hematologic    Bleeding problems:    Problems with blood clotting too easily:        Skin    Rashes or ulcers:        Constitutional    Fever or chills:     PHYSICAL EXAM:   Vitals:   06/23/22 0835  BP: 130/79  Pulse: (!) 55  Resp: 20  Temp: 98.2 F (36.8 C)  SpO2: 95%  Weight: 224 lb (101.6 kg)  Height: 5\' 5"  (1.651 m)    GENERAL: The patient is a well-nourished male, in no acute distress. The vital signs are documented above. CARDIAC: There is a regular rate and rhythm.  VASCULAR: I do not detect carotid bruits. He has palpable femoral, dorsalis pedis, posterior tibial pulses bilaterally. He has no significant lower extremity swelling. PULMONARY: There is good air exchange bilaterally without wheezing or rales. ABDOMEN: Soft and non-tender with normal pitched bowel sounds.  His aneurysm is palpable and nontender. MUSCULOSKELETAL: There are no major deformities or cyanosis. NEUROLOGIC: No focal weakness or paresthesias are detected. SKIN: There are no ulcers or rashes noted. PSYCHIATRIC: The patient has a normal affect.  DATA:    DUPLEX ABDOMINAL AORTA: I have independently interpreted his duplex of the abdominal aorta.  The maximum diameter of his aneurysm was 5.36 cm.  The right common iliac artery measures 1.4 cm in maximum diameter.  The left common iliac artery measures 1.7 cm in maximum diameter.  Deitra Mayo [facility] [phone]

## 2022-07-27 DIAGNOSIS — I10 Essential (primary) hypertension: Secondary | ICD-10-CM | POA: Diagnosis not present

## 2022-07-27 DIAGNOSIS — Z8679 Personal history of other diseases of the circulatory system: Secondary | ICD-10-CM | POA: Diagnosis not present

## 2022-07-27 DIAGNOSIS — I714 Abdominal aortic aneurysm, without rupture, unspecified: Secondary | ICD-10-CM | POA: Diagnosis not present

## 2022-07-27 DIAGNOSIS — E039 Hypothyroidism, unspecified: Secondary | ICD-10-CM | POA: Diagnosis not present

## 2022-07-27 DIAGNOSIS — R739 Hyperglycemia, unspecified: Secondary | ICD-10-CM | POA: Diagnosis not present

## 2022-07-27 DIAGNOSIS — E7849 Other hyperlipidemia: Secondary | ICD-10-CM | POA: Diagnosis not present

## 2022-07-27 DIAGNOSIS — E559 Vitamin D deficiency, unspecified: Secondary | ICD-10-CM | POA: Diagnosis not present

## 2022-07-27 DIAGNOSIS — N390 Urinary tract infection, site not specified: Secondary | ICD-10-CM | POA: Diagnosis not present

## 2022-07-27 DIAGNOSIS — Z23 Encounter for immunization: Secondary | ICD-10-CM | POA: Diagnosis not present

## 2022-07-28 ENCOUNTER — Other Ambulatory Visit: Payer: Self-pay | Admitting: Cardiology

## 2022-12-14 DIAGNOSIS — R739 Hyperglycemia, unspecified: Secondary | ICD-10-CM | POA: Diagnosis not present

## 2022-12-14 DIAGNOSIS — I10 Essential (primary) hypertension: Secondary | ICD-10-CM | POA: Diagnosis not present

## 2022-12-21 DIAGNOSIS — E559 Vitamin D deficiency, unspecified: Secondary | ICD-10-CM | POA: Diagnosis not present

## 2022-12-21 DIAGNOSIS — Z8679 Personal history of other diseases of the circulatory system: Secondary | ICD-10-CM | POA: Diagnosis not present

## 2022-12-21 DIAGNOSIS — Z0000 Encounter for general adult medical examination without abnormal findings: Secondary | ICD-10-CM | POA: Diagnosis not present

## 2022-12-21 DIAGNOSIS — I714 Abdominal aortic aneurysm, without rupture, unspecified: Secondary | ICD-10-CM | POA: Diagnosis not present

## 2022-12-21 DIAGNOSIS — E1169 Type 2 diabetes mellitus with other specified complication: Secondary | ICD-10-CM | POA: Diagnosis not present

## 2022-12-21 DIAGNOSIS — I10 Essential (primary) hypertension: Secondary | ICD-10-CM | POA: Diagnosis not present

## 2022-12-21 DIAGNOSIS — E039 Hypothyroidism, unspecified: Secondary | ICD-10-CM | POA: Diagnosis not present

## 2022-12-21 DIAGNOSIS — R946 Abnormal results of thyroid function studies: Secondary | ICD-10-CM | POA: Diagnosis not present

## 2022-12-23 DIAGNOSIS — E1169 Type 2 diabetes mellitus with other specified complication: Secondary | ICD-10-CM | POA: Diagnosis not present

## 2022-12-23 DIAGNOSIS — R7989 Other specified abnormal findings of blood chemistry: Secondary | ICD-10-CM | POA: Diagnosis not present

## 2022-12-23 DIAGNOSIS — I714 Abdominal aortic aneurysm, without rupture, unspecified: Secondary | ICD-10-CM | POA: Diagnosis not present

## 2022-12-28 ENCOUNTER — Telehealth: Payer: Self-pay

## 2022-12-28 NOTE — Telephone Encounter (Signed)
Shanda Bumps, pt's daughter in law called to see if pt can have sooner CT than June. She stated per Dr. Mayford Knife at [facility], that his AAA has grown. I have spoken to Dahlia Client at [facility] and she is aware that we are working on moving pt's appt sooner.

## 2022-12-30 ENCOUNTER — Telehealth: Payer: Self-pay

## 2022-12-30 NOTE — Telephone Encounter (Signed)
Pt's daughter, Corie Chiquito, called stating that the "tumor" is hurting the pt. He c/o pain near his ribs and back.   Msg sent to [facility] concerning moving CT earlier.

## 2023-01-05 ENCOUNTER — Other Ambulatory Visit: Payer: Self-pay

## 2023-01-05 DIAGNOSIS — I7133 Infrarenal abdominal aortic aneurysm, ruptured: Secondary | ICD-10-CM

## 2023-01-05 DIAGNOSIS — I7143 Infrarenal abdominal aortic aneurysm, without rupture: Secondary | ICD-10-CM

## 2023-01-17 DIAGNOSIS — E1169 Type 2 diabetes mellitus with other specified complication: Secondary | ICD-10-CM | POA: Diagnosis not present

## 2023-01-17 DIAGNOSIS — I10 Essential (primary) hypertension: Secondary | ICD-10-CM | POA: Diagnosis not present

## 2023-01-17 DIAGNOSIS — R300 Dysuria: Secondary | ICD-10-CM | POA: Diagnosis not present

## 2023-01-17 DIAGNOSIS — Z8679 Personal history of other diseases of the circulatory system: Secondary | ICD-10-CM | POA: Diagnosis not present

## 2023-01-17 DIAGNOSIS — Z0001 Encounter for general adult medical examination with abnormal findings: Secondary | ICD-10-CM | POA: Diagnosis not present

## 2023-01-17 DIAGNOSIS — Z23 Encounter for immunization: Secondary | ICD-10-CM | POA: Diagnosis not present

## 2023-01-17 DIAGNOSIS — I714 Abdominal aortic aneurysm, without rupture, unspecified: Secondary | ICD-10-CM | POA: Diagnosis not present

## 2023-01-26 ENCOUNTER — Ambulatory Visit: Payer: 59 | Admitting: Vascular Surgery

## 2023-01-31 ENCOUNTER — Ambulatory Visit (HOSPITAL_COMMUNITY)
Admission: RE | Admit: 2023-01-31 | Discharge: 2023-01-31 | Disposition: A | Discharge status: Home / Self Care | Payer: 59 | Source: Ambulatory Visit | Attending: Vascular Surgery | Admitting: Vascular Surgery

## 2023-01-31 DIAGNOSIS — I7133 Infrarenal abdominal aortic aneurysm, ruptured: Secondary | ICD-10-CM | POA: Diagnosis not present

## 2023-01-31 DIAGNOSIS — I7143 Infrarenal abdominal aortic aneurysm, without rupture: Secondary | ICD-10-CM | POA: Diagnosis not present

## 2023-01-31 MED ORDER — IOHEXOL 350 MG/ML SOLN
100.0000 mL | Freq: Once | INTRAVENOUS | Status: AC | PRN
  Administered 2023-01-31: 100 mL via INTRAVENOUS

## 2023-02-02 ENCOUNTER — Ambulatory Visit: Payer: Medicare Other | Admitting: Vascular Surgery

## 2023-02-02 ENCOUNTER — Encounter: Payer: Self-pay | Admitting: Vascular Surgery

## 2023-02-02 ENCOUNTER — Ambulatory Visit (INDEPENDENT_AMBULATORY_CARE_PROVIDER_SITE_OTHER): Payer: 59 | Admitting: Vascular Surgery

## 2023-02-02 VITALS — BP 131/82 | HR 56 | Temp 97.9°F | Resp 20 | Ht 65.0 in | Wt 224.0 lb

## 2023-02-02 DIAGNOSIS — I7143 Infrarenal abdominal aortic aneurysm, without rupture: Secondary | ICD-10-CM

## 2023-02-02 NOTE — Progress Notes (Signed)
REASON FOR VISIT:   Follow-up of infrarenal abdominal aortic aneurysm.  MEDICAL ISSUES:   INFRARENAL ABDOMINAL AORTIC ANEURYSM: This patient has a 5.2 cm infrarenal abdominal aortic aneurysm.  He is not a smoker.  His blood pressure is under good control.  I explained that in a normal risk patient we would consider elective repair of the aneurysm at 5.5 cm.  Based on his CT scan he appears to be an excellent candidate for endovascular repair.  I have ordered a follow-up ultrasound in 6 months.  The aneurysm has been fairly stable in size measuring 5.1 cm in October 2022.  Regardless, if the aneurysm does enlarge significantly on his next follow-up visit I suspect he would want to proceed with repair of his aneurysm.    HPI:   Jason Sweeney is a pleasant 67 y.o. male who presents for follow-up of an abdominal aortic aneurysm.  I last saw him on 06/23/2022.  At that time the aneurysm had enlarged slightly from 5.1 cm in October 2022 to 5.36 cm.  I explained that we would normally consider elective repair and a normal risk patient at 5.5 cm.  Given that the aneurysm was approaching 5.5 cm I set him up for a CT angiogram and a 69-month follow-up visit.  Since I saw him last he does have chronic low back pain.  He has had no new onset back pain.  His blood pressure has been under good control.  He quit smoking over 25 years ago.  He does complain of some bloating and indigestion.  He denies any chest pain.  He does admit to some dyspnea on exertion.  Past Medical History:  Diagnosis Date   AAA (abdominal aortic aneurysm) ([facility])    AKI (acute kidney injury) ([facility]) 12/14/2017   Chronic neck pain    HTN (hypertension) 12/14/2017   SVT (supraventricular tachycardia) 12/14/2017    Family History  Problem Relation Age of Onset   Hypertension Mother    Heart disease Mother     SOCIAL HISTORY: Social History   Tobacco Use   Smoking status: Former    Types: Cigarettes    Quit date: 08/29/1993     Years since quitting: 29.4   Smokeless tobacco: Never  Substance Use Topics   Alcohol use: Yes    Comment: occasionally    No Known Allergies  Current Outpatient Medications  Medication Sig Dispense Refill   acetaminophen (TYLENOL) 325 MG tablet Take 650 mg by mouth as needed.     amLODipine (NORVASC) 10 MG tablet Take 1 tablet (10 mg total) by mouth daily. Take one tablet by mouth midday 365 tablet 0   aspirin EC 81 MG EC tablet Take 1 tablet (81 mg total) by mouth daily.     carvedilol (COREG) 3.125 MG tablet Take 1 tablet (3.125 mg total) by mouth 2 (two) times daily. 540 tablet 0   ibuprofen (ADVIL,MOTRIN) 200 MG tablet Take 400 mg by mouth every 6 (six) hours as needed for headache or moderate pain.      losartan (COZAAR) 25 MG tablet Take 1 tablet (25 mg total) by mouth daily. 365 tablet 0   metFORMIN (GLUCOPHAGE-XR) 500 MG 24 hr tablet Take 500 mg by mouth 2 (two) times daily.     No current facility-administered medications for this visit.    REVIEW OF SYSTEMS:  [X]  denotes positive finding, [ ]  denotes negative finding Cardiac  Comments:  Chest pain or chest pressure:    Shortness of  breath upon exertion: x   Short of breath when lying flat:    Irregular heart rhythm:        Vascular    Pain in calf, thigh, or hip brought on by ambulation:    Pain in feet at night that wakes you up from your sleep:     Blood clot in your veins:    Leg swelling:         Pulmonary    Oxygen at home:    Productive cough:     Wheezing:         Neurologic    Sudden weakness in arms or legs:     Sudden numbness in arms or legs:     Sudden onset of difficulty speaking or slurred speech:    Temporary loss of vision in one eye:     Problems with dizziness:         Gastrointestinal    Blood in stool:     Vomited blood:         Genitourinary    Burning when urinating:     Blood in urine:        Psychiatric    Major depression:         Hematologic    Bleeding problems:     Problems with blood clotting too easily:        Skin    Rashes or ulcers:        Constitutional    Fever or chills:     PHYSICAL EXAM:   Vitals:   02/02/23 0904  BP: 131/82  Pulse: (!) 56  Resp: 20  Temp: 97.9 F (36.6 C)  SpO2: 94%  Weight: 224 lb (101.6 kg)  Height: 5\' 5"  (1.651 m)    GENERAL: The patient is a well-nourished male, in no acute distress. The vital signs are documented above. CARDIAC: There is a regular rate and rhythm.  VASCULAR: I do not detect carotid bruits. He has palpable femoral dorsalis pedis and posterior tibial pulses bilaterally. PULMONARY: There is good air exchange bilaterally without wheezing or rales. ABDOMEN: Soft and non-tender with normal pitched bowel sounds.  His aneurysm is palpable and nontender. MUSCULOSKELETAL: There are no major deformities or cyanosis. NEUROLOGIC: No focal weakness or paresthesias are detected. SKIN: There are no ulcers or rashes noted. PSYCHIATRIC: The patient has a normal affect.  DATA:    CTA OF ABDOMEN PELVIS: I have independently interpreted the CT angio of the abdomen and pelvis.  The maximum diameter of his aneurysm is 5.2 cm.  This is not changed compared to the study 6 months ago and by duplex it was 5.36 cm.  The radiology report noted that the aneurysm was 6 cm but that was actually the length which is not clinically significant.  Based on the study if the aneurysm does enlarge he would be a candidate for endovascular repair at this point.  Waverly Ferrari [facility] [phone]

## 2023-02-06 ENCOUNTER — Other Ambulatory Visit: Payer: Self-pay | Admitting: *Deleted

## 2023-02-06 DIAGNOSIS — Z1211 Encounter for screening for malignant neoplasm of colon: Secondary | ICD-10-CM | POA: Diagnosis not present

## 2023-02-06 DIAGNOSIS — I7143 Infrarenal abdominal aortic aneurysm, without rupture: Secondary | ICD-10-CM

## 2023-03-01 IMAGING — DX DG CHEST 1V
1 series · 1 of 1 positions shown · non-contrast
Comparison: January 15, 2021

CLINICAL DATA: Lower bilateral back pain.

EXAM:
CHEST  1 VIEW

[chest ap]
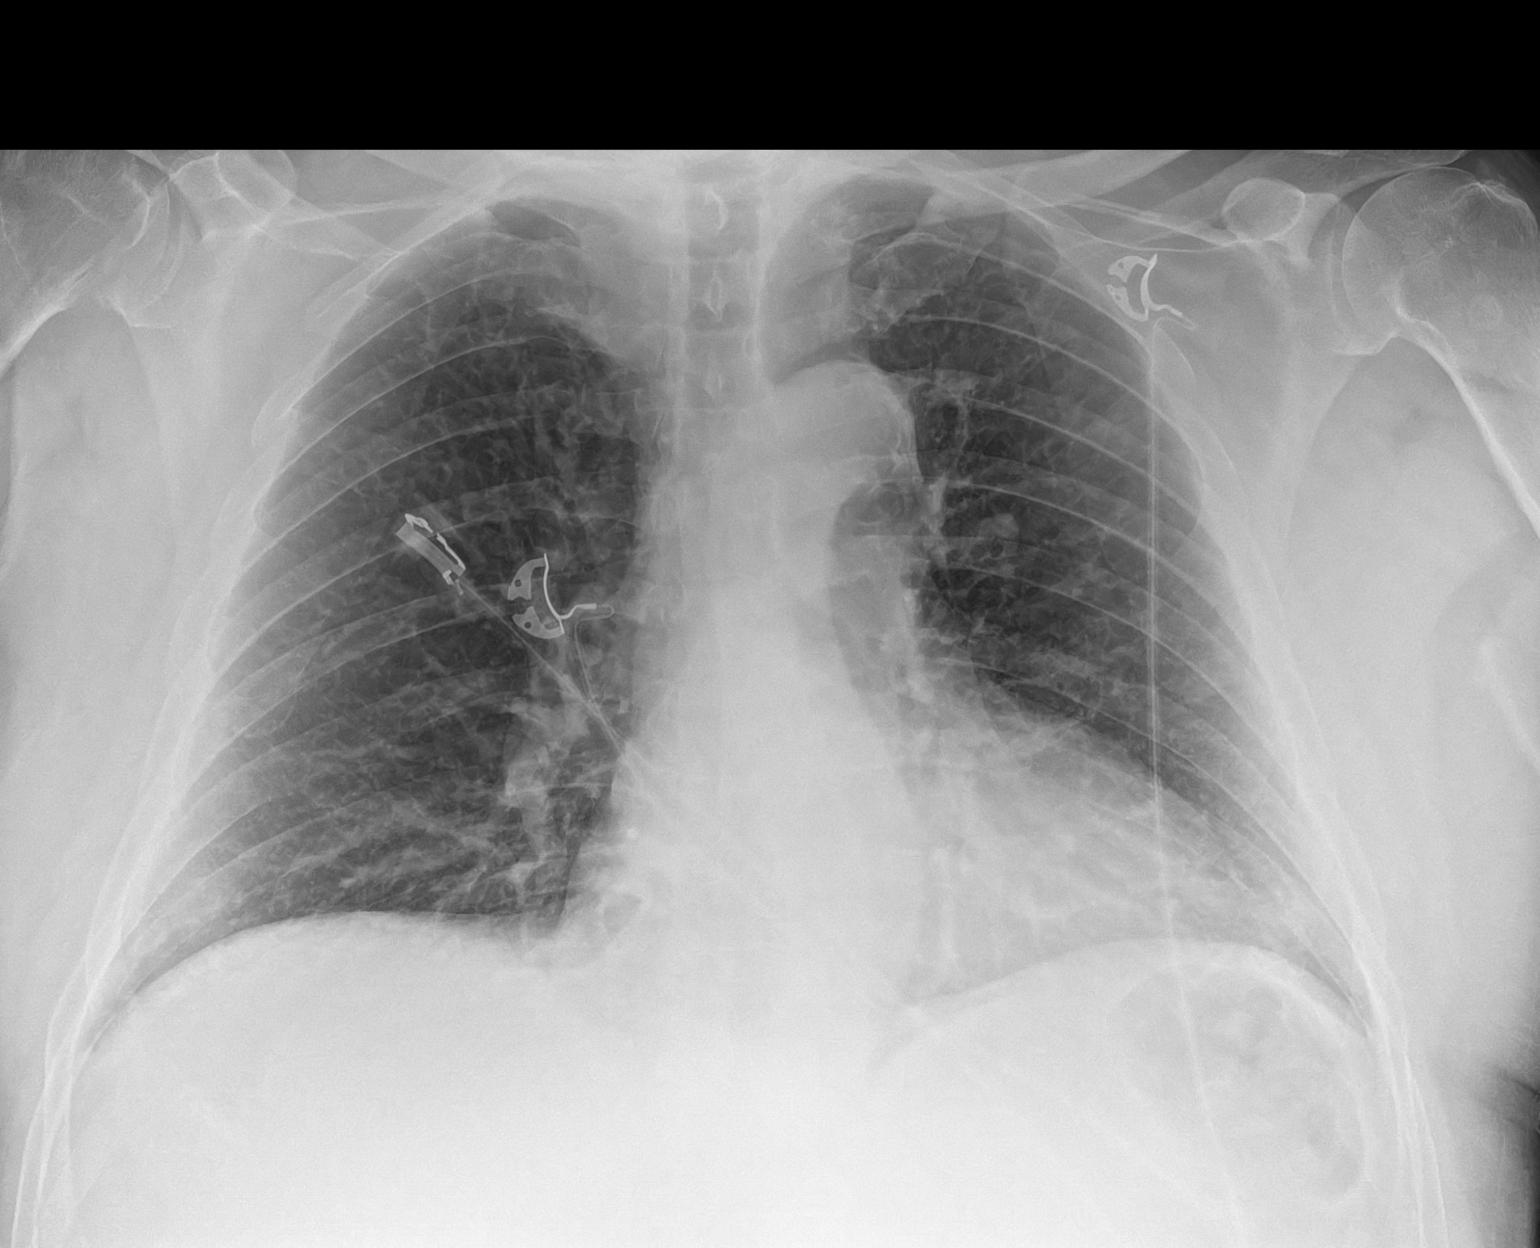

[1 of 1 positions shown; findings below may reference images not displayed]

FINDINGS: Mild atelectasis is seen within the bilateral lung bases. There is
no evidence of acute infiltrate, pleural effusion or pneumothorax.
The heart size and mediastinal contours are within normal limits.
The visualized skeletal structures are unremarkable.
IMPRESSION: Mild bibasilar atelectasis.

## 2023-05-23 DIAGNOSIS — E1169 Type 2 diabetes mellitus with other specified complication: Secondary | ICD-10-CM | POA: Diagnosis not present

## 2023-05-23 DIAGNOSIS — R739 Hyperglycemia, unspecified: Secondary | ICD-10-CM | POA: Diagnosis not present

## 2023-05-25 DIAGNOSIS — Z7984 Long term (current) use of oral hypoglycemic drugs: Secondary | ICD-10-CM | POA: Diagnosis not present

## 2023-05-25 DIAGNOSIS — Z87891 Personal history of nicotine dependence: Secondary | ICD-10-CM | POA: Diagnosis not present

## 2023-05-25 DIAGNOSIS — I10 Essential (primary) hypertension: Secondary | ICD-10-CM | POA: Diagnosis not present

## 2023-05-25 DIAGNOSIS — E119 Type 2 diabetes mellitus without complications: Secondary | ICD-10-CM | POA: Diagnosis not present

## 2023-05-25 DIAGNOSIS — K635 Polyp of colon: Secondary | ICD-10-CM | POA: Diagnosis not present

## 2023-05-25 DIAGNOSIS — Z79899 Other long term (current) drug therapy: Secondary | ICD-10-CM | POA: Diagnosis not present

## 2023-05-25 DIAGNOSIS — I2510 Atherosclerotic heart disease of native coronary artery without angina pectoris: Secondary | ICD-10-CM | POA: Diagnosis not present

## 2023-05-25 DIAGNOSIS — Z1211 Encounter for screening for malignant neoplasm of colon: Secondary | ICD-10-CM | POA: Diagnosis not present

## 2023-05-25 DIAGNOSIS — K5730 Diverticulosis of large intestine without perforation or abscess without bleeding: Secondary | ICD-10-CM | POA: Diagnosis not present

## 2023-05-25 DIAGNOSIS — D122 Benign neoplasm of ascending colon: Secondary | ICD-10-CM | POA: Diagnosis not present

## 2023-05-25 DIAGNOSIS — Z955 Presence of coronary angioplasty implant and graft: Secondary | ICD-10-CM | POA: Diagnosis not present

## 2023-06-14 DIAGNOSIS — D126 Benign neoplasm of colon, unspecified: Secondary | ICD-10-CM | POA: Diagnosis not present

## 2023-06-14 DIAGNOSIS — K5790 Diverticulosis of intestine, part unspecified, without perforation or abscess without bleeding: Secondary | ICD-10-CM | POA: Diagnosis not present

## 2023-06-18 ENCOUNTER — Inpatient Hospital Stay (HOSPITAL_COMMUNITY)
Admission: EM | Admit: 2023-06-18 | Discharge: 2023-06-20 | DRG: 872 | Disposition: A | Discharge status: Home / Self Care | Payer: 59 | Attending: Family Medicine | Admitting: Family Medicine

## 2023-06-18 ENCOUNTER — Emergency Department (HOSPITAL_COMMUNITY): Payer: 59

## 2023-06-18 ENCOUNTER — Encounter (HOSPITAL_COMMUNITY): Payer: Self-pay

## 2023-06-18 ENCOUNTER — Other Ambulatory Visit: Payer: Self-pay

## 2023-06-18 DIAGNOSIS — E1169 Type 2 diabetes mellitus with other specified complication: Secondary | ICD-10-CM | POA: Diagnosis not present

## 2023-06-18 DIAGNOSIS — N3289 Other specified disorders of bladder: Secondary | ICD-10-CM | POA: Diagnosis not present

## 2023-06-18 DIAGNOSIS — Z7982 Long term (current) use of aspirin: Secondary | ICD-10-CM

## 2023-06-18 DIAGNOSIS — Z1152 Encounter for screening for COVID-19: Secondary | ICD-10-CM | POA: Diagnosis not present

## 2023-06-18 DIAGNOSIS — Z87891 Personal history of nicotine dependence: Secondary | ICD-10-CM | POA: Diagnosis not present

## 2023-06-18 DIAGNOSIS — E876 Hypokalemia: Secondary | ICD-10-CM | POA: Diagnosis present

## 2023-06-18 DIAGNOSIS — Z7984 Long term (current) use of oral hypoglycemic drugs: Secondary | ICD-10-CM

## 2023-06-18 DIAGNOSIS — R109 Unspecified abdominal pain: Secondary | ICD-10-CM | POA: Diagnosis not present

## 2023-06-18 DIAGNOSIS — I10 Essential (primary) hypertension: Secondary | ICD-10-CM | POA: Diagnosis present

## 2023-06-18 DIAGNOSIS — R0602 Shortness of breath: Secondary | ICD-10-CM | POA: Diagnosis not present

## 2023-06-18 DIAGNOSIS — I714 Abdominal aortic aneurysm, without rupture, unspecified: Secondary | ICD-10-CM | POA: Diagnosis present

## 2023-06-18 DIAGNOSIS — E871 Hypo-osmolality and hyponatremia: Secondary | ICD-10-CM | POA: Diagnosis present

## 2023-06-18 DIAGNOSIS — N3001 Acute cystitis with hematuria: Secondary | ICD-10-CM | POA: Diagnosis present

## 2023-06-18 DIAGNOSIS — E860 Dehydration: Secondary | ICD-10-CM | POA: Diagnosis present

## 2023-06-18 DIAGNOSIS — Z8249 Family history of ischemic heart disease and other diseases of the circulatory system: Secondary | ICD-10-CM | POA: Diagnosis not present

## 2023-06-18 DIAGNOSIS — Z79899 Other long term (current) drug therapy: Secondary | ICD-10-CM | POA: Diagnosis not present

## 2023-06-18 DIAGNOSIS — R918 Other nonspecific abnormal finding of lung field: Secondary | ICD-10-CM | POA: Diagnosis not present

## 2023-06-18 DIAGNOSIS — R6520 Severe sepsis without septic shock: Secondary | ICD-10-CM | POA: Diagnosis not present

## 2023-06-18 DIAGNOSIS — Z9049 Acquired absence of other specified parts of digestive tract: Secondary | ICD-10-CM | POA: Diagnosis not present

## 2023-06-18 DIAGNOSIS — E872 Acidosis, unspecified: Secondary | ICD-10-CM | POA: Diagnosis present

## 2023-06-18 DIAGNOSIS — E119 Type 2 diabetes mellitus without complications: Secondary | ICD-10-CM | POA: Diagnosis present

## 2023-06-18 DIAGNOSIS — A419 Sepsis, unspecified organism: Secondary | ICD-10-CM | POA: Diagnosis not present

## 2023-06-18 DIAGNOSIS — N390 Urinary tract infection, site not specified: Secondary | ICD-10-CM | POA: Diagnosis present

## 2023-06-18 LAB — CBC WITH DIFFERENTIAL/PLATELET
Abs Immature Granulocytes: 0.07 10*3/uL (ref 0.00–0.07)
Basophils Absolute: 0.1 10*3/uL (ref 0.0–0.1)
Basophils Relative: 1 %
Eosinophils Absolute: 0 10*3/uL (ref 0.0–0.5)
Eosinophils Relative: 0 %
HCT: 48.8 % (ref 39.0–52.0)
Hemoglobin: 16.9 g/dL (ref 13.0–17.0)
Immature Granulocytes: 0 %
Lymphocytes Relative: 10 %
Lymphs Abs: 1.7 10*3/uL (ref 0.7–4.0)
MCH: 32.8 pg (ref 26.0–34.0)
MCHC: 34.6 g/dL (ref 30.0–36.0)
MCV: 94.8 fL (ref 80.0–100.0)
Monocytes Absolute: 2.3 10*3/uL — ABNORMAL HIGH (ref 0.1–1.0)
Monocytes Relative: 13 %
Neutro Abs: 13.5 10*3/uL — ABNORMAL HIGH (ref 1.7–7.7)
Neutrophils Relative %: 76 %
Platelets: 225 10*3/uL (ref 150–400)
RBC: 5.15 MIL/uL (ref 4.22–5.81)
RDW: 14 % (ref 11.5–15.5)
WBC: 17.7 10*3/uL — ABNORMAL HIGH (ref 4.0–10.5)
nRBC: 0 % (ref 0.0–0.2)

## 2023-06-18 LAB — BASIC METABOLIC PANEL
Anion gap: 9 (ref 5–15)
BUN: 11 mg/dL (ref 8–23)
CO2: 27 mmol/L (ref 22–32)
Calcium: 9.2 mg/dL (ref 8.9–10.3)
Chloride: 97 mmol/L — ABNORMAL LOW (ref 98–111)
Creatinine, Ser: 0.76 mg/dL (ref 0.61–1.24)
GFR, Estimated: >60 mL/min (ref >60)
Glucose, Bld: 188 mg/dL — ABNORMAL HIGH (ref 70–99)
Potassium: 3.9 mmol/L (ref 3.5–5.1)
Sodium: 133 mmol/L — ABNORMAL LOW (ref 135–145)

## 2023-06-18 LAB — URINALYSIS, ROUTINE W REFLEX MICROSCOPIC
Bilirubin Urine: NEGATIVE
Glucose, UA: 150 mg/dL — AB
Ketones, ur: 5 mg/dL — AB
Nitrite: NEGATIVE
Protein, ur: 30 mg/dL — AB
RBC / HPF: >50 RBC/hpf (ref 0–5)
Specific Gravity, Urine: 1.016 (ref 1.005–1.030)
WBC, UA: >50 WBC/hpf (ref 0–5)
pH: 7 (ref 5.0–8.0)

## 2023-06-18 LAB — RESP PANEL BY RT-PCR (RSV, FLU A&B, COVID)  RVPGX2
Influenza A by PCR: NEGATIVE
Influenza B by PCR: NEGATIVE
Resp Syncytial Virus by PCR: NEGATIVE
SARS Coronavirus 2 by RT PCR: NEGATIVE

## 2023-06-18 LAB — HEMOGLOBIN A1C
Hgb A1c MFr Bld: 6.3 % — ABNORMAL HIGH (ref 4.8–5.6)
Mean Plasma Glucose: 134.11 mg/dL

## 2023-06-18 LAB — GLUCOSE, CAPILLARY: Glucose-Capillary: 175 mg/dL — ABNORMAL HIGH (ref 70–99)

## 2023-06-18 LAB — LACTIC ACID, PLASMA: Lactic Acid, Venous: 2.2 mmol/L (ref 0.5–1.9)

## 2023-06-18 MED ORDER — SODIUM CHLORIDE 0.9 % IV SOLN
1.0000 g | Freq: Once | INTRAVENOUS | Status: AC
  Administered 2023-06-19: 1 g via INTRAVENOUS
  Filled 2023-06-18: qty 10

## 2023-06-18 MED ORDER — SODIUM CHLORIDE 0.9 % IV SOLN
1.0000 g | Freq: Once | INTRAVENOUS | Status: AC
  Administered 2023-06-18: 1 g via INTRAVENOUS
  Filled 2023-06-18: qty 10

## 2023-06-18 MED ORDER — AMLODIPINE BESYLATE 5 MG PO TABS
10.0000 mg | ORAL_TABLET | Freq: Every day | ORAL | Status: DC
  Administered 2023-06-19 – 2023-06-20 (×2): 10 mg via ORAL
  Filled 2023-06-18 (×2): qty 2

## 2023-06-18 MED ORDER — SODIUM CHLORIDE 0.9 % IV BOLUS
1000.0000 mL | Freq: Once | INTRAVENOUS | Status: AC
  Administered 2023-06-18: 1000 mL via INTRAVENOUS

## 2023-06-18 MED ORDER — LOSARTAN POTASSIUM 50 MG PO TABS
25.0000 mg | ORAL_TABLET | Freq: Every day | ORAL | Status: DC
  Administered 2023-06-19 – 2023-06-20 (×2): 25 mg via ORAL
  Filled 2023-06-18 (×2): qty 1

## 2023-06-18 MED ORDER — SODIUM CHLORIDE 0.9 % IV SOLN
2.0000 g | INTRAVENOUS | Status: DC
  Administered 2023-06-19: 2 g via INTRAVENOUS
  Filled 2023-06-18: qty 20

## 2023-06-18 MED ORDER — ACETAMINOPHEN 500 MG PO TABS
1000.0000 mg | ORAL_TABLET | Freq: Once | ORAL | Status: AC
  Administered 2023-06-18: 1000 mg via ORAL
  Filled 2023-06-18: qty 2

## 2023-06-18 MED ORDER — ENOXAPARIN SODIUM 40 MG/0.4ML IJ SOSY
40.0000 mg | PREFILLED_SYRINGE | INTRAMUSCULAR | Status: DC
Start: 2023-06-18 — End: 2023-06-20
  Administered 2023-06-18 – 2023-06-19 (×2): 40 mg via SUBCUTANEOUS
  Filled 2023-06-18 (×2): qty 0.4

## 2023-06-18 MED ORDER — CARVEDILOL 3.125 MG PO TABS
3.1250 mg | ORAL_TABLET | Freq: Two times a day (BID) | ORAL | Status: DC
  Administered 2023-06-18 – 2023-06-20 (×4): 3.125 mg via ORAL
  Filled 2023-06-18 (×4): qty 1

## 2023-06-18 MED ORDER — ACETAMINOPHEN 325 MG PO TABS
650.0000 mg | ORAL_TABLET | Freq: Four times a day (QID) | ORAL | Status: DC | PRN
  Administered 2023-06-19 (×2): 650 mg via ORAL
  Filled 2023-06-18 (×2): qty 2

## 2023-06-18 MED ORDER — IBUPROFEN 400 MG PO TABS
600.0000 mg | ORAL_TABLET | Freq: Once | ORAL | Status: AC
  Administered 2023-06-18: 600 mg via ORAL
  Filled 2023-06-18: qty 2

## 2023-06-18 MED ORDER — POLYETHYLENE GLYCOL 3350 17 G PO PACK: 17.0000 g | PACK | Freq: Every day | ORAL | Status: DC | PRN

## 2023-06-18 MED ORDER — ONDANSETRON HCL 4 MG/2ML IJ SOLN: 4.0000 mg | Freq: Four times a day (QID) | INTRAMUSCULAR | Status: DC | PRN

## 2023-06-18 MED ORDER — ONDANSETRON HCL 4 MG PO TABS: 4.0000 mg | ORAL_TABLET | Freq: Four times a day (QID) | ORAL | Status: DC | PRN

## 2023-06-18 MED ORDER — SODIUM CHLORIDE 0.9 % IV SOLN: INTRAVENOUS | Status: AC

## 2023-06-18 MED ORDER — INSULIN ASPART 100 UNIT/ML IJ SOLN: 0.0000 [IU] | Freq: Every day | INTRAMUSCULAR | Status: DC

## 2023-06-18 MED ORDER — INSULIN ASPART 100 UNIT/ML IJ SOLN
0.0000 [IU] | Freq: Three times a day (TID) | INTRAMUSCULAR | Status: DC
  Administered 2023-06-19: 2 [IU] via SUBCUTANEOUS
  Administered 2023-06-19: 3 [IU] via SUBCUTANEOUS
  Administered 2023-06-19: 2 [IU] via SUBCUTANEOUS
  Administered 2023-06-20: 3 [IU] via SUBCUTANEOUS
  Administered 2023-06-20: 2 [IU] via SUBCUTANEOUS

## 2023-06-18 MED ORDER — ACETAMINOPHEN 650 MG RE SUPP: 650.0000 mg | Freq: Four times a day (QID) | RECTAL | Status: DC | PRN

## 2023-06-18 NOTE — ED Triage Notes (Addendum)
C/o body aches and chills, nausea, and diarrhea x2 days.  Also c/o urinary frequency and burning with urination x3 days

## 2023-06-18 NOTE — ED Provider Notes (Signed)
[place] EMERGENCY DEPARTMENT AT [facility] Provider Note   CSN: [id number] Arrival date & time: 06/18/23  1312     History  Chief Complaint  Patient presents with   Diarrhea    Jason Sweeney is a 67 y.o. male.  The history is provided by the patient. No language interpreter was used.  Diarrhea      Home Medications Prior to Admission medications   Medication Sig Start Date End Date Taking? Authorizing Provider  acetaminophen (TYLENOL) 325 MG tablet Take 650 mg by mouth as needed.    [provider]  amLODipine (NORVASC) 10 MG tablet Take 1 tablet (10 mg total) by mouth daily. Take one tablet by mouth midday 05/30/22   Dyann Kief, PA-C  aspirin EC 81 MG EC tablet Take 1 tablet (81 mg total) by mouth daily. 12/16/17   Johnson, Clanford L, MD  carvedilol (COREG) 3.125 MG tablet Take 1 tablet (3.125 mg total) by mouth 2 (two) times daily. 05/30/22   Dyann Kief, PA-C  ibuprofen (ADVIL,MOTRIN) 200 MG tablet Take 400 mg by mouth every 6 (six) hours as needed for headache or moderate pain.     [provider]  losartan (COZAAR) 25 MG tablet Take 1 tablet (25 mg total) by mouth daily. 05/30/22   Dyann Kief, PA-C  metFORMIN (GLUCOPHAGE-XR) 500 MG 24 hr tablet Take 500 mg by mouth 2 (two) times daily. 12/21/22   [provider]      Allergies    Patient has no known allergies.    Review of Systems   Review of Systems  Gastrointestinal:  Positive for diarrhea.    Physical Exam Updated Vital Signs BP (!) 144/90 (BP Location: Left Arm)   Pulse (!) 106   Temp 99.2 F (37.3 C) (Oral)   Resp 17   Wt 101 kg   SpO2 96%   BMI 37.05 kg/m  Physical Exam  ED Results / Procedures / Treatments   Labs (all labs ordered are listed, but only abnormal results are displayed) Labs Reviewed  URINALYSIS, ROUTINE W REFLEX MICROSCOPIC - Abnormal; Notable for the following components:      Result Value   APPearance HAZY (*)    Glucose,  UA 150 (*)    Hgb urine dipstick MODERATE (*)    Ketones, ur 5 (*)    Protein, ur 30 (*)    Leukocytes,Ua LARGE (*)    Bacteria, UA RARE (*)    Crystals PRESENT (*)    All other components within normal limits  CBC WITH DIFFERENTIAL/PLATELET - Abnormal; Notable for the following components:   WBC 17.7 (*)    Neutro Abs 13.5 (*)    Monocytes Absolute 2.3 (*)    All other components within normal limits  BASIC METABOLIC PANEL - Abnormal; Notable for the following components:   Sodium 133 (*)    Chloride 97 (*)    Glucose, Bld 188 (*)    All other components within normal limits  LACTIC ACID, PLASMA - Abnormal; Notable for the following components:   Lactic Acid, Venous 3.1 (*)    All other components within normal limits  RESP PANEL BY RT-PCR (RSV, FLU A&B, COVID)  [id number]  URINE CULTURE  LACTIC ACID, PLASMA    EKG None  Radiology CT Renal Stone Study  Result Date: 06/18/2023 CLINICAL DATA:  Abdominal/flank pain, stone suspected. EXAM: CT ABDOMEN AND PELVIS WITHOUT CONTRAST TECHNIQUE: Multidetector CT imaging of the abdomen and pelvis was performed  following the standard protocol without IV contrast. RADIATION DOSE REDUCTION: This exam was performed according to the departmental dose-optimization program which includes automated exposure control, adjustment of the mA and/or kV according to patient size and/or use of iterative reconstruction technique. COMPARISON:  CT angiography abdomen and pelvis from 01/31/2023. FINDINGS: Lower chest: There are atelectatic changes in the visualized lung bases. No overt consolidation. No pleural effusion. The heart is normal in size. No pericardial effusion. Hepatobiliary: The liver is normal in size. Non-cirrhotic configuration. No suspicious mass. No intrahepatic or extrahepatic bile duct dilation. Gallbladder is surgically absent. Pancreas: Unremarkable. No pancreatic ductal dilatation or surrounding inflammatory changes. 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 No suspicious renal mass. There is a 1.4 x 1.7 cm sinus cyst arising from the right kidney lower pole, medially. No hydronephrosis. No renal or ureteric calculi. Urinary bladder is under distended, precluding optimal assessment. However, no large mass or stones identified. No perivesical fat stranding. Stomach/Bowel: There is a small diverticulum arising from the second part of duodenum. No disproportionate dilation of the small or large bowel loops. No evidence of abnormal bowel wall thickening or inflammatory changes. The appendix is unremarkable. There are multiple diverticula mainly in the left hemi colon, without imaging signs of diverticulitis. Vascular/Lymphatic: No ascites or pneumoperitoneum. No abdominal or pelvic lymphadenopathy, by size criteria. Redemonstration of saccular aneurysmal dilation of infrarenal aorta measuring up to 5.1 x 5.5 cm, slightly increased since the prior study. Cardiovascular consultation is recommended, if not previously performed. 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 No other aneurysmal dilation of the major abdominal arteries. There are moderate peripheral atherosclerotic vascular calcifications of the aorta and its major branches. Reproductive: Enlarged prostate. Symmetric seminal vesicles. Other: There is a tiny fat containing umbilical hernia. The soft tissues and abdominal wall are otherwise unremarkable. Musculoskeletal: No suspicious osseous lesions. There are mild multilevel degenerative changes in the visualized spine. IMPRESSION: 1. No acute findings in the abdomen or pelvis. No nephroureterolithiasis or hydronephrosis. 2. 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  Recommend CTA or MRA, as appropriate, in 6 months and referral to a vascular specialist. Reference: Journal of Vascular Surgery 67.1 (2018): 2-77. J Am Coll Radiol 2013;10:789-794. 3. 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 4. Multiple other nonacute observations, as described above Aortic Atherosclerosis (ICD10-I70.0). Electronically Signed   By: Jules Schick M.D.   On: 06/18/2023 16:37    Procedures Procedures    Medications Ordered in ED Medications  acetaminophen (TYLENOL) tablet 1,000 mg (1,000 mg Oral Given 06/18/23 1629)  ibuprofen (ADVIL) tablet 600 mg (600 mg Oral Given 06/18/23 1629)  sodium chloride 0.9 % bolus 1,000 mL (1,000 mLs Intravenous Bolus 06/18/23 1626)  cefTRIAXone (ROCEPHIN) 1 g in sodium chloride 0.9 % 100 mL IVPB (1 g Intravenous New Bag/Given 06/18/23 1625)  sodium chloride 0.9 % bolus 1,000 mL (1,000 mLs Intravenous Bolus 06/18/23 1702)    ED Course/ Medical Decision Making/ A&P                                 Medical Decision Making This patient presents to the ED for  concern of chills, body aches, dysuria, this involves an extensive number of treatment options, and is a complaint that carries with it a high risk of complications and morbidity.  The differential diagnosis includes UTI, pyelonephritis, sepsis, viral syndrome (COVID, flu)   Co morbidities that complicate the patient evaluation  diabetes   Additional history obtained:  Additional history obtained from NA    Lab Tests:  I Ordered, and personally interpreted labs.  The pertinent results include:  +UTI, crystals visualized; leukocytosis of 17.7; lactic acid 3.1    Imaging Studies ordered:  I ordered imaging studies including CT renal  I independently visualized and interpreted imaging which showed No stones visualized I agree with the radiologist interpretation       Problem List / ED Course /  Critical interventions / Medication management  Concern for sepsis - fever, tachy, elevated lactic acid  I ordered medication including Tylenol, ibuprofen for fever, aches  Reevaluation of the patient after these medicines showed that the patient improved I have reviewed the patients home medicines and have made adjustments as needed   Social Determinants of Health:  Slight Language barrier   Test / Admission - Considered:  CT obtained to r/o stones in the setting of UTI - neg for stones Lactic acid elevated, patient tachycardic, febrile (Tmax on my assessment 101 in ED), history of diabetes - consider sepsis Patient will benefit from admission for IV antibiotics  Hospitalist paged.     Amount and/or Complexity of Data Reviewed Labs: ordered. Radiology: ordered.  Risk OTC drugs. Prescription drug management.  Critical Care Total time providing critical care: 30 minutes          Final Clinical Impression(s) / ED Diagnoses Final diagnoses:  Sepsis without acute organ dysfunction, due to unspecified organism [facility])  Acute cystitis with hematuria    Rx / DC Orders ED Discharge Orders     None         Elpidio Anis, PA-C 06/18/23 1737    Eber Hong, MD 06/19/23 1524

## 2023-06-18 NOTE — Assessment & Plan Note (Signed)
-   HgbA1c - SSI- M -Hold on metformin

## 2023-06-18 NOTE — ED Provider Notes (Signed)
67-year-old male with history of diabetes presents with a couple of days of chills, on exam he is tachycardic, he was febrile on arrival, he has a leukocytosis of 17,000 and what appears to be a urinary tract infection with an elevated lactic acid.  No anemia, normal renal function, urinalysis with greater than 50 white blood cells and bacteria present, COVID-negative, CT scan confirms that the patient has no acute findings though he does have a known aneurysm which seems to be fairly close to the size of his prior aneurysm as imaged in June of this year.  The patient is still tachycardic getting IV fluids antibiotics and will need to be admitted for what appears to be early sepsis from a urinary source  .Critical Care  Performed by: Eber Hong, MD Authorized by: Eber Hong, MD   Critical care provider statement:    Critical care time (minutes):  45   Critical care time was exclusive of:  Separately billable procedures and treating other patients and teaching time   Critical care was necessary to treat or prevent imminent or life-threatening deterioration of the following conditions:  Sepsis   Critical care was time spent personally by me on the following activities:  Development of treatment plan with patient or surrogate, discussions with consultants, evaluation of patient's response to treatment, examination of patient, obtaining history from patient or surrogate, review of old charts, re-evaluation of patient's condition, pulse oximetry, ordering and review of radiographic studies, ordering and review of laboratory studies and ordering and performing treatments and interventions   I assumed direction of critical care for this patient from another provider in my specialty: no     Care discussed with: admitting provider   Comments:

## 2023-06-18 NOTE — Assessment & Plan Note (Signed)
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 Recommend CTA or MRA, as appropriate, in 6 months and referral to a vascular specialist. 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 -Follow-up as outpatient

## 2023-06-18 NOTE — ED Provider Triage Note (Signed)
Emergency Medicine Provider Triage Evaluation Note  Jason Sweeney , a 67 y.o. male  was evaluated in triage.  Pt complains of chills, diarrhea, urinary symptoms of frequency and dysuria. .  Review of Systems  Positive: Dysuria, chills, diarrhea Negative: Vomiting, cough, sore throat  Physical Exam  BP (!) 165/96   Pulse 100   Temp 99.2 F (37.3 C) (Oral)   Resp 18   Wt 101 kg   SpO2 95%   BMI 37.05 kg/m  Gen:   Awake, no distress   Resp:  Normal effort  MSK:   Moves extremities without difficulty  Other:  Well appearing  Medical Decision Making  Medically screening exam initiated at 1:56 PM.  Appropriate orders placed.  Larmar Streich was informed that the remainder of the evaluation will be completed by another provider, this initial triage assessment does not replace that evaluation, and the importance of remaining in the ED until their evaluation is complete.  3 days of symptoms as stated above. History of DM, HTN.    Elpidio Anis, PA-C 06/18/23 1357

## 2023-06-18 NOTE — Assessment & Plan Note (Addendum)
UTI with severe sepsis.  UA consistent with UTI with large leukocytes, moderate hemoglobin.  No recent urine cultures on file. -IV ceftriaxone 2 g daily - Add- on urine cultures

## 2023-06-18 NOTE — H&P (Signed)
History and Physical    Kailen Ohare ZOX:[id number] DOB: [birth year] DOA: 06/18/2023  PCP: [facility]   Patient coming from: Home  I have personally briefly reviewed patient's old medical records in [facility]  Chief Complaint: Body Aches, Chills  HPI: Jason Sweeney is a 67 y.o. male with medical history significant for hypertension, diabetes mellitus, AAA. Patient presented to the ED with complaints of generalized bodyaches, chills with nausea over the past 2 days, he reports about 4 episodes of vomiting 2 days ago.  Reports burning pain with urination over the past 3 days.   No difficulty breathing no cough, no abdominal pain, no diarrhea.  ED Course: Tmax 99.2.  Tachycardic heart rate 100-106.  Respiratory rate 17-18.  Blood pressure systolic 144-165. Leukocytosis of 17.7. Lactic acid 3.1 UA consistent with UTI. CT renal stone study negative for acute abnormality. IV Ceftriaxone 1 g given. Liter bolus given.  Hospitalist to admit for UTI with sepsis  Review of Systems: As per HPI all other systems reviewed and negative.  Past Medical History:  Diagnosis Date   AAA (abdominal aortic aneurysm) (HCC)    AKI (acute kidney injury) (HCC) 12/14/2017   Chronic neck pain    HTN (hypertension) 12/14/2017   SVT (supraventricular tachycardia) (HCC) 12/14/2017    Past Surgical History:  Procedure Laterality Date   ABDOMINAL SURGERY     SHOULDER SURGERY Left    SHOULDER SURGERY     SVT ABLATION N/A 02/09/2018   Procedure: SVT ABLATION;  Surgeon: Marinus Maw, MD;  Location: [facility] INVASIVE CV LAB;  Service: Cardiovascular;  Laterality: N/A;     reports that he quit smoking about 29 years ago. His smoking use included cigarettes. He has never used smokeless tobacco. He reports current alcohol use. He reports that he does not currently use drugs.  No Known Allergies  Family History  Problem Relation Age of Onset   Hypertension Mother    Heart disease  Mother     Prior to Admission medications   Medication Sig Start Date End Date Taking? Authorizing Provider  acetaminophen (TYLENOL) 325 MG tablet Take 650 mg by mouth as needed.    [provider]  amLODipine (NORVASC) 10 MG tablet Take 1 tablet (10 mg total) by mouth daily. Take one tablet by mouth midday 05/30/22   Dyann Kief, PA-C  aspirin EC 81 MG EC tablet Take 1 tablet (81 mg total) by mouth daily. 12/16/17   Johnson, Clanford L, MD  carvedilol (COREG) 3.125 MG tablet Take 1 tablet (3.125 mg total) by mouth 2 (two) times daily. 05/30/22   Dyann Kief, PA-C  ibuprofen (ADVIL,MOTRIN) 200 MG tablet Take 400 mg by mouth every 6 (six) hours as needed for headache or moderate pain.     [provider]  losartan (COZAAR) 25 MG tablet Take 1 tablet (25 mg total) by mouth daily. 05/30/22   Dyann Kief, PA-C  metFORMIN (GLUCOPHAGE-XR) 500 MG 24 hr tablet Take 500 mg by mouth 2 (two) times daily. 12/21/22   [provider]    Physical Exam: Vitals:   06/18/23 1320 06/18/23 1321 06/18/23 1657 06/18/23 1800  BP: (!) 165/96  (!) 144/90 131/81  Pulse: 100  (!) 106 (!) 102  Resp: 18  17   Temp: 99.2 F (37.3 C)  99.2 F (37.3 C)   TempSrc: Oral  Oral   SpO2: 95%  96% 93%  Weight:  101 kg  Constitutional: NAD, calm, comfortable Vitals:   06/18/23 1320 06/18/23 1321 06/18/23 1657 06/18/23 1800  BP: (!) 165/96  (!) 144/90 131/81  Pulse: 100  (!) 106 (!) 102  Resp: 18  17   Temp: 99.2 F (37.3 C)  99.2 F (37.3 C)   TempSrc: Oral  Oral   SpO2: 95%  96% 93%  Weight:  101 kg     Eyes: PERRL, lids and conjunctivae normal ENMT: Mucous membranes are moist Neck: normal, supple, no masses, no thyromegaly Respiratory: clear to auscultation bilaterally, no wheezing, no crackles. Normal respiratory effort. No accessory muscle use.  Cardiovascular: Regular rate and rhythm, no murmurs / rubs / gallops. No extremity edema.  Extremities warm. Abdomen: no  tenderness, no masses palpated. No hepatosplenomegaly. Bowel sounds positive.  Musculoskeletal: no clubbing / cyanosis. No joint deformity upper and lower extremities.  Skin: no rashes, lesions, ulcers. No induration Neurologic: No facial asymmetry, moving extremities spontaneously, speech fluent. Psychiatric: Normal judgment and insight. Alert and oriented x 3. Normal mood.   Labs on Admission: I have personally reviewed following labs and imaging studies  CBC: Recent Labs  Lab 06/18/23 1434  WBC 17.7*  NEUTROABS 13.5*  HGB 16.9  HCT 48.8  MCV 94.8  PLT 225   Basic Metabolic Panel: Recent Labs  Lab 06/18/23 1434  NA 133*  K 3.9  CL 97*  CO2 27  GLUCOSE 188*  BUN 11  CREATININE 0.76  CALCIUM 9.2   Urine analysis:    Component Value Date/Time   COLORURINE YELLOW 06/18/2023 1322   APPEARANCEUR HAZY (A) 06/18/2023 1322   LABSPEC 1.016 06/18/2023 1322   PHURINE 7.0 06/18/2023 1322   GLUCOSEU 150 (A) 06/18/2023 1322   HGBUR MODERATE (A) 06/18/2023 1322   BILIRUBINUR NEGATIVE 06/18/2023 1322   KETONESUR 5 (A) 06/18/2023 1322   PROTEINUR 30 (A) 06/18/2023 1322   NITRITE NEGATIVE 06/18/2023 1322   LEUKOCYTESUR LARGE (A) 06/18/2023 1322    Radiological Exams on Admission: CT Renal Stone Study  Result Date: 06/18/2023 CLINICAL DATA:  Abdominal/flank pain, stone suspected. EXAM: CT ABDOMEN AND PELVIS WITHOUT CONTRAST TECHNIQUE: Multidetector CT imaging of the abdomen and pelvis was performed following the standard protocol without IV contrast. RADIATION DOSE REDUCTION: This exam was performed according to the departmental dose-optimization program which includes automated exposure control, adjustment of the mA and/or kV according to patient size and/or use of iterative reconstruction technique. COMPARISON:  CT angiography abdomen and pelvis from 01/31/2023. FINDINGS: Lower chest: There are atelectatic changes in the visualized lung bases. No overt consolidation. No pleural  effusion. The heart is normal in size. No pericardial effusion. Hepatobiliary: The liver is normal in size. Non-cirrhotic configuration. No suspicious mass. No intrahepatic or extrahepatic bile duct dilation. Gallbladder is surgically absent. Pancreas: Unremarkable. No pancreatic ductal dilatation or surrounding inflammatory changes. 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 No suspicious renal mass. There is a 1.4 x 1.7 cm sinus cyst arising from the right kidney lower pole, medially. No hydronephrosis. No renal or ureteric calculi. Urinary bladder is under distended, precluding optimal assessment. However, no large mass or stones identified. No perivesical fat stranding. Stomach/Bowel: There is a small diverticulum arising from the second part of duodenum. No disproportionate dilation of the small or large bowel loops. No evidence of abnormal bowel wall thickening or inflammatory changes. The appendix is unremarkable. There are multiple diverticula mainly in the left hemi colon, without imaging signs of diverticulitis. Vascular/Lymphatic: No ascites  or pneumoperitoneum. No abdominal or pelvic lymphadenopathy, by size criteria. Redemonstration of saccular aneurysmal dilation of infrarenal aorta measuring up to 5.1 x 5.5 cm, slightly increased since the prior study. Cardiovascular consultation is recommended, if not previously performed. 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 No other aneurysmal dilation of the major abdominal arteries. There are moderate peripheral atherosclerotic vascular calcifications of the aorta and its major branches. Reproductive: Enlarged prostate. Symmetric seminal vesicles. Other: There is a tiny fat containing umbilical hernia. The soft tissues and abdominal wall are otherwise  unremarkable. Musculoskeletal: No suspicious osseous lesions. There are mild multilevel degenerative changes in the visualized spine. IMPRESSION: 1. No acute findings in the abdomen or pelvis. No nephroureterolithiasis or hydronephrosis. 2. 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 Recommend CTA or MRA, as appropriate, in 6 months and referral to a vascular specialist. Reference: Journal of Vascular Surgery 67.1 (2018): 2-77. J Am Coll Radiol 2013;10:789-794. 3. 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 4. Multiple other nonacute observations, as described above Aortic Atherosclerosis (ICD10-I70.0). Electronically Signed   By: Jules Schick M.D.   On: 06/18/2023 16:37    EKG: Pending   Assessment/Plan Principal Problem:   Severe sepsis (HCC) Active Problems:   UTI (urinary tract infection)   HTN (hypertension)   AAA (abdominal aortic aneurysm) without rupture (HCC)   DM (diabetes mellitus) (HCC)  Assessment and Plan: * Severe sepsis (HCC) Severe sepsis secondary to UTI.  Meets severe sepsis criteria with tachycardia heart rate 100-106, leukocytosis of 17.7, with evidence of endorgan dysfunction and a lactic acidosis of 3.1.  Renal Stone study negative for acute abnormality. -IV ceftriaxone 2 g daily -1 L bolus given, continue N/s 75cc/hr X 12hrs -Obtain blood cultures,  - add on urine cultures - trend Lactic acid  UTI (urinary tract infection) UTI with severe sepsis.  UA consistent with UTI with large leukocytes, moderate hemoglobin.  No recent urine cultures on file. -IV ceftriaxone 2 g daily - Add- on urine cultures  DM (diabetes mellitus) (HCC) - HgbA1c - SSI- M -Hold on metformin  AAA (abdominal aortic aneurysm) without rupture (HCC) CT renal stone study today-06/18/2023-  Saccular aneurysmal dilation of the infrarenal  aorta measuring up to 5.1 x 5.5 cm, slightly increased since the prior study. Recommend CTA or MRA, as appropriate, in 6 months and referral to a vascular specialist. 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 -Follow-up as outpatient  HTN (hypertension) Stable. -Resume Norvasc, carvedilol losartan    DVT prophylaxis: Lovenox Code Status: FULL Family Communication: None at bedside Disposition Plan: ~ 2- 3 days Consults called: None Admission status: Inpt Tele  I certify that at the point of admission it is my clinical judgment that the patient will require inpatient hospital care spanning beyond 2 midnights from the point of admission due to high intensity of service, high risk for further deterioration and high frequency of surveillance required.    Author: Onnie Boer, MD 06/18/2023 6:37 PM  For on call review [email or web address].

## 2023-06-18 NOTE — Assessment & Plan Note (Addendum)
Severe sepsis secondary to UTI.  Meets severe sepsis criteria with tachycardia heart rate 100-106, leukocytosis of 17.7, with evidence of endorgan dysfunction and a lactic acidosis of 3.1.  Renal Stone study negative for acute abnormality. -IV ceftriaxone 2 g daily -1 L bolus given, continue N/s 75cc/hr X 12hrs -Obtain blood cultures,  - add on urine cultures - trend Lactic acid

## 2023-06-18 NOTE — Assessment & Plan Note (Signed)
Stable. -Resume Norvasc, carvedilol losartan

## 2023-06-19 DIAGNOSIS — R6520 Severe sepsis without septic shock: Secondary | ICD-10-CM | POA: Diagnosis not present

## 2023-06-19 DIAGNOSIS — A419 Sepsis, unspecified organism: Secondary | ICD-10-CM | POA: Diagnosis not present

## 2023-06-19 LAB — GLUCOSE, CAPILLARY
Glucose-Capillary: 144 mg/dL — ABNORMAL HIGH (ref 70–99)
Glucose-Capillary: 154 mg/dL — ABNORMAL HIGH (ref 70–99)
Glucose-Capillary: 144 mg/dL — ABNORMAL HIGH (ref 70–99)
Glucose-Capillary: 142 mg/dL — ABNORMAL HIGH (ref 70–99)

## 2023-06-19 LAB — BASIC METABOLIC PANEL
Anion gap: 9 (ref 5–15)
BUN: 11 mg/dL (ref 8–23)
CO2: 24 mmol/L (ref 22–32)
Calcium: 8.6 mg/dL — ABNORMAL LOW (ref 8.9–10.3)
Chloride: 101 mmol/L (ref 98–111)
Creatinine, Ser: 0.75 mg/dL (ref 0.61–1.24)
GFR, Estimated: >60 mL/min (ref >60)
Glucose, Bld: 159 mg/dL — ABNORMAL HIGH (ref 70–99)
Potassium: 3.4 mmol/L — ABNORMAL LOW (ref 3.5–5.1)
Sodium: 134 mmol/L — ABNORMAL LOW (ref 135–145)

## 2023-06-19 LAB — CBC
HCT: 45.5 % (ref 39.0–52.0)
Hemoglobin: 15.3 g/dL (ref 13.0–17.0)
MCH: 32.1 pg (ref 26.0–34.0)
MCHC: 33.6 g/dL (ref 30.0–36.0)
MCV: 95.4 fL (ref 80.0–100.0)
Platelets: 217 10*3/uL (ref 150–400)
RBC: 4.77 MIL/uL (ref 4.22–5.81)
RDW: 14 % (ref 11.5–15.5)
WBC: 19.7 10*3/uL — ABNORMAL HIGH (ref 4.0–10.5)
nRBC: 0 % (ref 0.0–0.2)

## 2023-06-19 LAB — LACTIC ACID, PLASMA: Lactic Acid, Venous: 3.1 mmol/L (ref 0.5–1.9)

## 2023-06-19 LAB — HIV ANTIBODY (ROUTINE TESTING W REFLEX): HIV Screen 4th Generation wRfx: NONREACTIVE

## 2023-06-19 MED ORDER — OXYCODONE HCL 5 MG PO TABS
5.0000 mg | ORAL_TABLET | Freq: Once | ORAL | Status: AC | PRN
  Administered 2023-06-19: 5 mg via ORAL
  Filled 2023-06-19: qty 1

## 2023-06-19 MED ORDER — MELATONIN 3 MG PO TABS
6.0000 mg | ORAL_TABLET | Freq: Once | ORAL | Status: AC
Start: 2023-06-19 — End: 2023-06-19
  Administered 2023-06-19: 6 mg via ORAL
  Filled 2023-06-19: qty 2

## 2023-06-19 NOTE — Plan of Care (Signed)

## 2023-06-19 NOTE — Progress Notes (Signed)
PROGRESS NOTE   Jason Sweeney, is a 67 y.o. male, DOB - [birth year], ZOX:[id number]  Admit date - 06/18/2023   Admitting Physician Ejiroghene Wendall Stade, MD  Outpatient Primary MD for the patient is [facility]  LOS - 1  Chief Complaint  Patient presents with   Diarrhea        Brief Narrative:  67 y.o. male with medical history significant for hypertension, diabetes mellitus, AAA.Marland Kitchen  Admitted on 06/18/2023 with severe sepsis due to urinary source    -Assessment and Plan: 1)Severe sepsis due to presumed urinary source-POA - Renal Stone study negative for acute abnormality. 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 Recommend CTA or MRA, as appropriate, in 6 months and referral to a vascular specialist. 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 -Follow-up as outpatient  4)HTN (hypertension) Stable. -Resume Norvasc, carvedilol losartan  5) hyponatremia and hypokalemia--due to dehydration, patient received IV fluids -Encourage adequate oral intake  Status is: Inpatient   Disposition: The patient is from: Home              Anticipated d/c is to: Home              Anticipated d/c date is: 2 days              Patient currently is not medically stable to d/c. Barriers: Not Clinically Stable-   Code Status :  -  Code Status: Full Code   Family Communication:    Discussed with son  Jason Sweeney at bedside  DVT Prophylaxis  :   - SCDs  enoxaparin (LOVENOX) injection 40 mg Start: 06/18/23 2100   Lab Results  Component Value Date   PLT 217 06/19/2023    Inpatient Medications  Scheduled Meds:  amLODipine  10 mg Oral Daily   carvedilol  3.125 mg Oral BID   enoxaparin (LOVENOX) injection  40 mg Subcutaneous Q24H   insulin aspart  0-15 Units Subcutaneous TID WC   insulin aspart  0-5 Units Subcutaneous QHS   losartan  25 mg Oral Daily   Continuous Infusions:  sodium chloride 75 mL/hr at 06/18/23 2139   cefTRIAXone (ROCEPHIN)  IV     cefTRIAXone (ROCEPHIN)  IV     PRN Meds:.acetaminophen **OR** acetaminophen, ondansetron **OR** ondansetron (ZOFRAN) IV, polyethylene glycol   Anti-infectives (From admission, onward)    Start     Dose/Rate Route Frequency Ordered Stop   06/19/23 1700  cefTRIAXone (ROCEPHIN) 1 g in sodium chloride 0.9 % 100 mL IVPB       Note to Pharmacy: Totaling 2 g, dose adjusted for weight.   1 g 200 mL/hr over 30 Minutes Intravenous  Once 06/18/23 1800     06/19/23 1600  cefTRIAXone (ROCEPHIN) 2 g in sodium chloride 0.9 % 100 mL IVPB        2 g 200 mL/hr over 30 Minutes Intravenous Every 24 hours 06/18/23 2009     06/18/23 1615  cefTRIAXone (ROCEPHIN) 1 g  in sodium chloride 0.9 % 100 mL IVPB        1 g 200 mL/hr over 30 Minutes Intravenous  Once 06/18/23 1609 06/18/23 1655         Subjective: [name] today has no further fevers, no emesis,  No chest pain,   - reports some dysuria and urinary frequency Tmax 101, T-current 99.1 Son Jason Sweeney is at bedside, questions answered  Objective: Vitals:   06/19/23 0431 06/19/23 0500 06/19/23 0504 06/19/23 0608  BP: (!) 153/94   125/79  Pulse: (!) 110   99  Resp: 20   18  Temp: 100.3 F (37.9 C)  (!) 101 F (38.3 C) 99.8 F (37.7 C)  TempSrc: Oral  Oral Oral  SpO2: 99%   94%  Weight:      Height:  5\' 6"  (1.676 m)      Intake/Output Summary (Last 24 hours) at 06/19/2023 0825 Last  data filed at 06/19/2023 0447 Gross per 24 hour  Intake 531.09 ml  Output --  Net 531.09 ml   Filed Weights   06/18/23 1321 06/18/23 1843  Weight: 101 kg 97.7 kg    Physical Exam  Gen:- Awake Alert,  in no apparent distress  HEENT:- Frost.AT, No sclera icterus Neck-Supple Neck,No JVD,.  Lungs-  CTAB , fair symmetrical air movement CV- S1, S2 normal, regular  Abd-  +ve B.Sounds, Abd Soft, No tenderness, no CVA tenderness    Extremity/Skin:- No  edema, pedal pulses present  Psych-affect is appropriate, oriented x3 Neuro-no new focal deficits, no tremors  Data Reviewed: I have personally reviewed following labs and imaging studies  CBC: Recent Labs  Lab 06/18/23 1434 06/19/23 0444  WBC 17.7* 19.7*  NEUTROABS 13.5*  --   HGB 16.9 15.3  HCT 48.8 45.5  MCV 94.8 95.4  PLT 225 217   Basic Metabolic Panel: Recent Labs  Lab 06/18/23 1434  NA 133*  K 3.9  CL 97*  CO2 27  GLUCOSE 188*  BUN 11  CREATININE 0.76  CALCIUM 9.2   GFR: Estimated Creatinine Clearance: 98.1 mL/min (by C-G formula based on SCr of 0.76 mg/dL).  Recent Results (from the past 240 hour(s))  Resp panel by RT-PCR (RSV, Flu A&B, Covid) Anterior Nasal Swab     Status: None   Collection Time: 06/18/23  1:21 PM   Specimen: Anterior Nasal Swab  Result Value Ref Range Status   SARS Coronavirus 2 by RT PCR NEGATIVE NEGATIVE Final    Comment: (NOTE) SARS-CoV-2 target nucleic acids are NOT DETECTED.  The SARS-CoV-2 RNA is generally detectable in upper respiratory specimens during the acute phase of infection. The lowest concentration of SARS-CoV-2 viral copies this assay can detect is 138 copies/mL. A negative result does not preclude SARS-Cov-2 infection and should not be used as the sole basis for treatment or other patient management decisions. A negative result may occur with  improper specimen collection/handling, submission of specimen other than nasopharyngeal swab, presence of viral mutation(s)  within the areas targeted by this assay, and inadequate number of viral copies(<138 copies/mL). A negative result must be combined with clinical observations, patient history, and epidemiological information. The expected result is Negative.  Fact Sheet for Patients:  BloggerCourse.com  Fact Sheet for Healthcare Providers:  SeriousBroker.it  This test is no t yet approved or cleared by the [place] FDA and  has been authorized for detection and/or diagnosis of SARS-CoV-2 by FDA under an Emergency Use Authorization (EUA). This EUA will remain  in effect (meaning this test can be used) for the duration of the COVID-19 declaration under Section 564(b)(1) of the Act, 21 U.S.C.section 360bbb-3(b)(1), unless the authorization is terminated  or revoked sooner.       Influenza A by PCR NEGATIVE NEGATIVE Final   Influenza B by PCR NEGATIVE NEGATIVE Final    Comment: (NOTE) The Xpert Xpress SARS-CoV-2/FLU/RSV plus assay is intended as an aid in the diagnosis of influenza from Nasopharyngeal swab specimens and should not be used as a sole basis for treatment. Nasal washings and aspirates are unacceptable for Xpert Xpress SARS-CoV-2/FLU/RSV testing.  Fact Sheet for Patients: BloggerCourse.com  Fact Sheet for Healthcare Providers: SeriousBroker.it  This test is not yet approved or cleared by the [place] FDA and has been authorized for detection and/or diagnosis of SARS-CoV-2 by FDA under an Emergency Use Authorization (EUA). This EUA will remain in effect (meaning this test can be used) for the duration of the COVID-19 declaration under Section 564(b)(1) of the Act, 21 U.S.C. section 360bbb-3(b)(1), unless the authorization is terminated or revoked.     Resp Syncytial Virus by PCR NEGATIVE NEGATIVE Final    Comment: (NOTE) Fact Sheet for  Patients: BloggerCourse.com  Fact Sheet for Healthcare Providers: SeriousBroker.it  This test is not yet approved or cleared by the [place] FDA and has been authorized for detection and/or diagnosis of SARS-CoV-2 by FDA under an Emergency Use Authorization (EUA). This EUA will remain in effect (meaning this test can be used) for the duration of the COVID-19 declaration under Section 564(b)(1) of the Act, 21 U.S.C. section 360bbb-3(b)(1), unless the authorization is terminated or revoked.  Performed at [facility], [address] [place], [place] [address]   Culture, blood (Routine X 2) w Reflex to ID Panel     Status: None (Preliminary result)   Collection Time: 06/18/23  7:33 PM   Specimen: BLOOD RIGHT HAND  Result Value Ref Range Status   Specimen Description BLOOD RIGHT HAND BLOOD  Final   Special Requests   Final    BOTTLES DRAWN AEROBIC AND ANAEROBIC Blood Culture adequate volume Performed at [facility], [address] [place], [place] [address]    Culture PENDING  Incomplete   Report Status PENDING  Incomplete      Radiology Studies: CT Renal Stone Study  Result Date: 06/18/2023 CLINICAL DATA:  Abdominal/flank pain, stone suspected. EXAM: CT ABDOMEN AND PELVIS WITHOUT CONTRAST TECHNIQUE: Multidetector CT imaging of the abdomen and pelvis was performed following the standard protocol without IV contrast. RADIATION DOSE REDUCTION: This exam was performed according to the departmental dose-optimization program which includes automated exposure control, adjustment of the mA and/or kV according to patient size and/or use of iterative reconstruction technique. COMPARISON:  CT angiography abdomen and pelvis from 01/31/2023. FINDINGS: Lower chest: There are atelectatic changes in the visualized lung bases. No overt consolidation. No pleural effusion. The heart is normal in size. No pericardial effusion. Hepatobiliary: The liver  is normal in size. Non-cirrhotic configuration. No suspicious mass. No intrahepatic or extrahepatic bile duct dilation. Gallbladder is surgically absent. Pancreas: Unremarkable. No pancreatic ductal dilatation or surrounding inflammatory changes. 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 No suspicious renal mass. There is a 1.4 x 1.7 cm sinus cyst arising from the right kidney lower pole, medially. No hydronephrosis. No renal or ureteric calculi. Urinary bladder is under distended, precluding optimal assessment. However, no large mass or stones identified. No perivesical fat stranding. Stomach/Bowel:  There is a small diverticulum arising from the second part of duodenum. No disproportionate dilation of the small or large bowel loops. No evidence of abnormal bowel wall thickening or inflammatory changes. The appendix is unremarkable. There are multiple diverticula mainly in the left hemi colon, without imaging signs of diverticulitis. Vascular/Lymphatic: No ascites or pneumoperitoneum. No abdominal or pelvic lymphadenopathy, by size criteria. Redemonstration of saccular aneurysmal dilation of infrarenal aorta measuring up to 5.1 x 5.5 cm, slightly increased since the prior study. Cardiovascular consultation is recommended, if not previously performed. 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 No other aneurysmal dilation of the major abdominal arteries. There are moderate peripheral atherosclerotic vascular calcifications of the aorta and its major branches. Reproductive: Enlarged prostate. Symmetric seminal vesicles. Other: There is a tiny fat containing umbilical hernia. The soft tissues and abdominal wall are otherwise unremarkable. Musculoskeletal: No suspicious osseous lesions. There are mild multilevel degenerative  changes in the visualized spine. IMPRESSION: 1. No acute findings in the abdomen or pelvis. No nephroureterolithiasis or hydronephrosis. 2. 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 Recommend CTA or MRA, as appropriate, in 6 months and referral to a vascular specialist. Reference: Journal of Vascular Surgery 67.1 (2018): 2-77. J Am Coll Radiol 2013;10:789-794. 3. 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 4. Multiple other nonacute observations, as described above Aortic Atherosclerosis (ICD10-I70.0). Electronically Signed   By: Jules Schick M.D.   On: 06/18/2023 16:37     Scheduled Meds:  amLODipine  10 mg Oral Daily   carvedilol  3.125 mg Oral BID   enoxaparin (LOVENOX) injection  40 mg Subcutaneous Q24H   insulin aspart  0-15 Units Subcutaneous TID WC   insulin aspart  0-5 Units Subcutaneous QHS   losartan  25 mg Oral Daily   Continuous Infusions:  sodium chloride 75 mL/hr at 06/18/23 2139   cefTRIAXone (ROCEPHIN)  IV     cefTRIAXone (ROCEPHIN)  IV       LOS: 1 day    Shon Hale M.D on 06/19/2023 at 8:25 AM  Go to [email or web address] - for contact info  [facility] - Office  [phone]  If 7PM-7AM, please contact night-coverage [email or web address] 06/19/2023, 8:25 AM

## 2023-06-19 NOTE — Progress Notes (Signed)
Transition of Care [facility]) - Inpatient Brief Assessment   Patient Details  Name: Jason Sweeney MRN: [id number] Date of Birth: 05/06/1956  Transition of Care [facility]) CM/SW Contact:    Edison Wollschlager A Taisa Deloria, RN Phone Number: 06/19/2023, 6:28 PM   Clinical Narrative: Admitted with UTI-sepsis with elevated white blood count. Transition of Care Department [facility]) has reviewed patient and no TOC needs have been identified at this time. We will continue to monitor patient advancement through interdisciplinary progression rounds. If new patient transition needs arise, please place a TOC consult.   Transition of Care Asessment: Insurance and Status: (P) Insurance coverage has been reviewed Patient has primary care physician: (P) Yes Home environment has been reviewed: (P) From home Prior level of function:: (P) Independent Prior/Current Home Services: (P) No current home services Social Determinants of Health Reivew: (P) SDOH reviewed no interventions necessary Readmission risk has been reviewed: (P) Yes Transition of care needs: (P) no transition of care needs at this time

## 2023-06-20 ENCOUNTER — Inpatient Hospital Stay (HOSPITAL_COMMUNITY): Payer: 59

## 2023-06-20 DIAGNOSIS — A419 Sepsis, unspecified organism: Secondary | ICD-10-CM | POA: Diagnosis not present

## 2023-06-20 DIAGNOSIS — R6520 Severe sepsis without septic shock: Secondary | ICD-10-CM | POA: Diagnosis not present

## 2023-06-20 LAB — CBC
HCT: 42.2 % (ref 39.0–52.0)
Hemoglobin: 14.5 g/dL (ref 13.0–17.0)
MCH: 32.7 pg (ref 26.0–34.0)
MCHC: 34.4 g/dL (ref 30.0–36.0)
MCV: 95 fL (ref 80.0–100.0)
Platelets: 233 10*3/uL (ref 150–400)
RBC: 4.44 MIL/uL (ref 4.22–5.81)
RDW: 14.2 % (ref 11.5–15.5)
WBC: 16.9 10*3/uL — ABNORMAL HIGH (ref 4.0–10.5)
nRBC: 0 % (ref 0.0–0.2)

## 2023-06-20 LAB — URINE CULTURE: Culture: NO GROWTH

## 2023-06-20 LAB — GLUCOSE, CAPILLARY
Glucose-Capillary: 127 mg/dL — ABNORMAL HIGH (ref 70–99)
Glucose-Capillary: 159 mg/dL — ABNORMAL HIGH (ref 70–99)

## 2023-06-20 MED ORDER — POLYETHYLENE GLYCOL 3350 17 G PO PACK
17.0000 g | PACK | Freq: Once | ORAL | Status: AC
  Administered 2023-06-20: 17 g via ORAL
  Filled 2023-06-20: qty 1

## 2023-06-20 MED ORDER — AZITHROMYCIN 250 MG PO TABS
500.0000 mg | ORAL_TABLET | Freq: Once | ORAL | Status: AC
  Administered 2023-06-20: 500 mg via ORAL
  Filled 2023-06-20: qty 2

## 2023-06-20 MED ORDER — FUROSEMIDE 40 MG PO TABS
40.0000 mg | ORAL_TABLET | Freq: Once | ORAL | Status: AC
  Administered 2023-06-20: 40 mg via ORAL
  Filled 2023-06-20: qty 1

## 2023-06-20 MED ORDER — AZITHROMYCIN 500 MG PO TABS: 500.0000 mg | ORAL_TABLET | Freq: Every day | ORAL | 0 refills | Status: AC

## 2023-06-20 MED ORDER — FUROSEMIDE 20 MG PO TABS: 20.0000 mg | ORAL_TABLET | Freq: Every day | ORAL | 0 refills | Status: DC

## 2023-06-20 MED ORDER — BISACODYL 10 MG RE SUPP
10.0000 mg | Freq: Once | RECTAL | Status: DC
  Filled 2023-06-20: qty 1

## 2023-06-20 MED ORDER — CEPHALEXIN 500 MG PO CAPS: 500.0000 mg | ORAL_CAPSULE | Freq: Three times a day (TID) | ORAL | 0 refills | Status: AC

## 2023-06-20 MED ORDER — SENNOSIDES-DOCUSATE SODIUM 8.6-50 MG PO TABS
2.0000 | ORAL_TABLET | Freq: Once | ORAL | Status: AC
  Administered 2023-06-20: 2 via ORAL
  Filled 2023-06-20: qty 2

## 2023-06-20 MED ORDER — LACTULOSE 10 GM/15ML PO SOLN
30.0000 g | Freq: Once | ORAL | Status: DC
  Filled 2023-06-20: qty 60

## 2023-06-20 MED ORDER — POTASSIUM CHLORIDE ER 20 MEQ PO TBCR: 20.0000 meq | EXTENDED_RELEASE_TABLET | Freq: Every day | ORAL | 0 refills | Status: DC

## 2023-06-20 NOTE — Plan of Care (Signed)

## 2023-06-20 NOTE — Care Management Important Message (Signed)
Important Message  Patient Details  Name: Jason Sweeney MRN: [id number] Date of Birth: 26-May-1956   Important Message Given:  N/A - LOS <3 / Initial given by admissions     Corey Harold 06/20/2023, 10:56 AM

## 2023-06-20 NOTE — Discharge Summary (Signed)
Jason Sweeney, is a 67 y.o. male  DOB 09-27-1955  MRN [id number].  Admission date:  06/18/2023  Admitting Physician  Onnie Boer, MD  Discharge Date:  06/20/2023   Primary MD  [facility], [facility]  Recommendations for primary care physician for things to follow:   1)Take  Keflex and Azithromycin antibiotic as prescribed 2) repeat CBC and BMP blood test with primary care physician in 1 week 3) Please Note that there has been some changes to your medications  Admission Diagnosis  Acute cystitis with hematuria [N30.01] Severe sepsis (HCC) [A41.9, R65.20] Sepsis without acute organ dysfunction, due to unspecified organism [facility]) [A41.9]   Discharge Diagnosis  Acute cystitis with hematuria [N30.01] Severe sepsis (HCC) [A41.9, R65.20] Sepsis without acute organ dysfunction, due to unspecified organism [facility]) [A41.9]    Principal Problem:   Severe sepsis (HCC) Active Problems:   UTI (urinary tract infection)   HTN (hypertension)   AAA (abdominal aortic aneurysm) without rupture (HCC)   DM (diabetes mellitus) (HCC)      Past Medical History:  Diagnosis Date   AAA (abdominal aortic aneurysm) (HCC)    AKI (acute kidney injury) (HCC) 12/14/2017   Chronic neck pain    HTN (hypertension) 12/14/2017   SVT (supraventricular tachycardia) (HCC) 12/14/2017    Past Surgical History:  Procedure Laterality Date   ABDOMINAL SURGERY     SHOULDER SURGERY Left    SHOULDER SURGERY     SVT ABLATION N/A 02/09/2018   Procedure: SVT ABLATION;  Surgeon: Marinus Maw, MD;  Location: [facility] INVASIVE CV LAB;  Service: Cardiovascular;  Laterality: N/A;       HPI  from the history and physical done on the day of admission:   Chief Complaint: Body Aches, Chills   HPI: Jason Sweeney is a 67 y.o. male with medical history significant for hypertension, diabetes mellitus, AAA. Patient presented to the  ED with complaints of generalized bodyaches, chills with nausea over the past 2 days, he reports about 4 episodes of vomiting 2 days ago.  Reports burning pain with urination over the past 3 days.   No difficulty breathing no cough, no abdominal pain, no diarrhea.   ED Course: Tmax 99.2.  Tachycardic heart rate 100-106.  Respiratory rate 17-18.  Blood pressure systolic 144-165. Leukocytosis of 17.7. Lactic acid 3.1 UA consistent with UTI. CT renal stone study negative for acute abnormality. IV Ceftriaxone 1 g given. Liter bolus given.  Hospitalist to admit for UTI with sepsis   Review of Systems: As per HPI all other systems reviewed and negative.     Hospital Course:   1)Severe sepsis --POA - Renal Stone study negative for acute abnormality. WBC--17.7 >> 19.7>>16.9 -Currently afebrile -Treated with IV ceftriaxone -Blood and urine cultures NGTD -Chest x-ray on 06/20/2023 suggest possible atypical pneumonia And possible edema -Add azithromycin for coverage of possible atypical pneumonia -Give Lasix --- suspect patient developed edema due to sepsis protocol fluids -Ambulating without significant dyspnea on exertion, no hypoxia   2)DM (diabetes mellitus) (  HCC) -A1c 6.3 reflecting excellent diabetic control PTA -Resume PTA diabetic regimen   3)AAA (abdominal aortic aneurysm) without rupture (HCC) CT renal stone study today-06/18/2023-  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 Recommend CTA or MRA, as appropriate, in 6 months and referral to a vascular specialist. 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 -Patient and son Caesar verbalize and for surveillance and repeat imaging  4)HTN (hypertension) Stable. --Blood pressure is soft -Continue carvedilol and Losartan -Hold amlodipine until BP is rechecked by PCP in about 1  week   5) hyponatremia and hypokalemia--  -Replaced -Give oral potassium supplementation while on short course of Lasix   Disposition: The patient is from: Home              Anticipated d/c is to: Home  Discharge Condition: stable  Follow UP   Follow-up Information     [facility]. Schedule an appointment as soon as possible for a visit in 1 week(s).   Why: Repeat CBC and BMP blood tests Contact information: [address] [place] [address] [phone]                 Diet and Activity recommendation:  As advised  Discharge Instructions    Discharge Instructions     Call MD for:  difficulty breathing, headache or visual disturbances   Complete by: As directed    Call MD for:  persistant dizziness or light-headedness   Complete by: As directed    Call MD for:  persistant nausea and vomiting   Complete by: As directed    Call MD for:  temperature >100.4   Complete by: As directed    Diet - low sodium heart healthy   Complete by: As directed    Discharge instructions   Complete by: As directed    1)Take  Keflex and Azithromycin antibiotic as prescribed 2) repeat CBC and BMP blood test with primary care physician in 1 week 3) Please Note that there has been some changes to your medications   Increase activity slowly   Complete by: As directed          Discharge Medications     Allergies as of 06/20/2023   No Known Allergies      Medication List     STOP taking these medications    amLODipine 10 MG tablet Commonly known as: NORVASC   ibuprofen 200 MG tablet Commonly known as: ADVIL       TAKE these medications    acetaminophen 325 MG tablet Commonly known as: TYLENOL Take 650 mg by mouth as needed.   azithromycin 500 MG tablet Commonly known as: ZITHROMAX Take 1 tablet (500 mg total) by mouth daily for 3 days. Start taking on: June 21, 2023   carvedilol 3.125 MG tablet Commonly known as: COREG Take 1  tablet (3.125 mg total) by mouth 2 (two) times daily.   cephALEXin 500 MG capsule Commonly known as: Keflex Take 1 capsule (500 mg total) by mouth 3 (three) times daily for 5 days.   furosemide 20 MG tablet Commonly known as: Lasix Take 1 tablet (20 mg total) by mouth daily for 7 days.   losartan 25 MG tablet Commonly known as: COZAAR Take 1 tablet (25 mg total) by mouth daily.   metFORMIN 500 MG 24 hr tablet Commonly  known as: GLUCOPHAGE-XR Take 500 mg by mouth 2 (two) times daily.   Potassium Chloride ER 20 MEQ Tbcr Take 1 tablet (20 mEq total) by mouth daily for 7 days. -- Take while taking Furosemide/Lasix        Major procedures and Radiology Reports - PLEASE review detailed and final reports for all details, in brief -    DG Chest 2 View  Result Date: 06/20/2023 CLINICAL DATA:  Dyspnea, shortness of breath EXAM: CHEST - 2 VIEW COMPARISON:  06/25/2021 FINDINGS: The heart size and mediastinal contours are within normal limits. Mild diffuse interstitial opacity. The visualized skeletal structures are unremarkable. IMPRESSION: Mild diffuse interstitial opacity, consistent with edema or atypical/viral infection. No focal airspace opacity. Electronically Signed   By: Jearld Lesch M.D.   On: 06/20/2023 12:40   CT Renal Stone Study  Result Date: 06/18/2023 CLINICAL DATA:  Abdominal/flank pain, stone suspected. EXAM: CT ABDOMEN AND PELVIS WITHOUT CONTRAST TECHNIQUE: Multidetector CT imaging of the abdomen and pelvis was performed following the standard protocol without IV contrast. RADIATION DOSE REDUCTION: This exam was performed according to the departmental dose-optimization program which includes automated exposure control, adjustment of the mA and/or kV according to patient size and/or use of iterative reconstruction technique. COMPARISON:  CT angiography abdomen and pelvis from 01/31/2023. FINDINGS: Lower chest: There are atelectatic changes in the visualized lung bases. No  overt consolidation. No pleural effusion. The heart is normal in size. No pericardial effusion. Hepatobiliary: The liver is normal in size. Non-cirrhotic configuration. No suspicious mass. No intrahepatic or extrahepatic bile duct dilation. Gallbladder is surgically absent. Pancreas: Unremarkable. No pancreatic ductal dilatation or surrounding inflammatory changes. 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 No suspicious renal mass. There is a 1.4 x 1.7 cm sinus cyst arising from the right kidney lower pole, medially. No hydronephrosis. No renal or ureteric calculi. Urinary bladder is under distended, precluding optimal assessment. However, no large mass or stones identified. No perivesical fat stranding. Stomach/Bowel: There is a small diverticulum arising from the second part of duodenum. No disproportionate dilation of the small or large bowel loops. No evidence of abnormal bowel wall thickening or inflammatory changes. The appendix is unremarkable. There are multiple diverticula mainly in the left hemi colon, without imaging signs of diverticulitis. Vascular/Lymphatic: No ascites or pneumoperitoneum. No abdominal or pelvic lymphadenopathy, by size criteria. Redemonstration of saccular aneurysmal dilation of infrarenal aorta measuring up to 5.1 x 5.5 cm, slightly increased since the prior study. Cardiovascular consultation is recommended, if not previously performed. 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 No other aneurysmal dilation of the major abdominal arteries. There are moderate peripheral atherosclerotic vascular calcifications of the aorta and its major branches. Reproductive: Enlarged prostate. Symmetric seminal vesicles. Other: There is a tiny fat containing umbilical hernia. The soft tissues and abdominal  wall are otherwise unremarkable. Musculoskeletal: No suspicious osseous lesions. There are mild multilevel degenerative changes in the visualized spine. IMPRESSION: 1. No acute findings in the abdomen or pelvis. No nephroureterolithiasis or hydronephrosis. 2. 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 Recommend CTA or MRA, as appropriate, in 6 months and referral to a vascular specialist. Reference: Journal of Vascular Surgery 67.1 (2018): 2-77. J Am Coll Radiol 2013;10:789-794. 3. Unchanged saccular aneurysmal dilation of the left common  iliac artery at the bifurcation measuring up to 1.9 x 2.2 cm. 4. Multiple other nonacute observations, as described above Aortic Atherosclerosis (ICD10-I70.0). Electronically Signed   By: Jules Schick M.D.   On: 06/18/2023 16:37    Micro Results   Recent Results (from the past 240 hour(s))  Resp panel by RT-PCR (RSV, Flu A&B, Covid) Anterior Nasal Swab     Status: None   Collection Time: 06/18/23  1:21 PM   Specimen: Anterior Nasal Swab  Result Value Ref Range Status   SARS Coronavirus 2 by RT PCR NEGATIVE NEGATIVE Final    Comment: (NOTE) SARS-CoV-2 target nucleic acids are NOT DETECTED.  The SARS-CoV-2 RNA is generally detectable in upper respiratory specimens during the acute phase of infection. The lowest concentration of SARS-CoV-2 viral copies this assay can detect is 138 copies/mL. A negative result does not preclude SARS-Cov-2 infection and should not be used as the sole basis for treatment or other patient management decisions. A negative result may occur with  improper specimen collection/handling, submission of specimen other than nasopharyngeal swab, presence of viral mutation(s) within the areas targeted by this assay, and inadequate number of viral copies(<138 copies/mL). A negative result must be combined with clinical observations, patient history, and  epidemiological information. The expected result is Negative.  Fact Sheet for Patients:  BloggerCourse.com  Fact Sheet for Healthcare Providers:  SeriousBroker.it  This test is no t yet approved or cleared by the [place] FDA and  has been authorized for detection and/or diagnosis of SARS-CoV-2 by FDA under an Emergency Use Authorization (EUA). This EUA will remain  in effect (meaning this test can be used) for the duration of the COVID-19 declaration under Section 564(b)(1) of the Act, 21 U.S.C.section 360bbb-3(b)(1), unless the authorization is terminated  or revoked sooner.       Influenza A by PCR NEGATIVE NEGATIVE Final   Influenza B by PCR NEGATIVE NEGATIVE Final    Comment: (NOTE) The Xpert Xpress SARS-CoV-2/FLU/RSV plus assay is intended as an aid in the diagnosis of influenza from Nasopharyngeal swab specimens and should not be used as a sole basis for treatment. Nasal washings and aspirates are unacceptable for Xpert Xpress SARS-CoV-2/FLU/RSV testing.  Fact Sheet for Patients: BloggerCourse.com  Fact Sheet for Healthcare Providers: SeriousBroker.it  This test is not yet approved or cleared by the [place] FDA and has been authorized for detection and/or diagnosis of SARS-CoV-2 by FDA under an Emergency Use Authorization (EUA). This EUA will remain in effect (meaning this test can be used) for the duration of the COVID-19 declaration under Section 564(b)(1) of the Act, 21 U.S.C. section 360bbb-3(b)(1), unless the authorization is terminated or revoked.     Resp Syncytial Virus by PCR NEGATIVE NEGATIVE Final    Comment: (NOTE) Fact Sheet for Patients: BloggerCourse.com  Fact Sheet for Healthcare Providers: SeriousBroker.it  This test is not yet approved or cleared by the [place] FDA and has been  authorized for detection and/or diagnosis of SARS-CoV-2 by FDA under an Emergency Use Authorization (EUA). This EUA will remain in effect (meaning this test can be used) for the duration of the COVID-19 declaration under Section 564(b)(1) of the Act, 21 U.S.C. section 360bbb-3(b)(1), unless the authorization is terminated or revoked.  Performed at [facility], [address] [place], [place] [address]   Culture, blood (Routine X 2) w Reflex to ID Panel     Status: None (Preliminary result)   Collection Time: 06/18/23  7:28 PM   Specimen:  Left Antecubital; Blood  Result Value Ref Range Status   Specimen Description LEFT ANTECUBITAL  Final   Special Requests NONE  Final   Culture   Final    NO GROWTH 2 DAYS Performed at [facility], [address] [place], [place] [address]    Report Status PENDING  Incomplete  Culture, blood (Routine X 2) w Reflex to ID Panel     Status: None (Preliminary result)   Collection Time: 06/18/23  7:33 PM   Specimen: BLOOD RIGHT HAND  Result Value Ref Range Status   Specimen Description BLOOD RIGHT HAND BLOOD  Final   Special Requests   Final    BOTTLES DRAWN AEROBIC AND ANAEROBIC Blood Culture adequate volume   Culture   Final    NO GROWTH 2 DAYS Performed at [facility], [address] [place], [place] [address]    Report Status PENDING  Incomplete  Urine Culture     Status: None   Collection Time: 06/18/23  8:13 PM   Specimen: Urine, Clean Catch  Result Value Ref Range Status   Specimen Description   Final    URINE, CLEAN CATCH Performed at [facility], [address] [place], [place] [address]    Special Requests   Final    NONE Performed at [facility], [address], [place], [place] [address]    Culture   Final    NO GROWTH Performed at [facility], [address], [place], [place] [address]    Report Status 06/20/2023 FINAL  Final    Today   Subjective    Mondarius Lanson today has no new complaints No fever  Or  chills   No Nausea, Vomiting or Diarrhea         Patient has been seen and examined prior to discharge   Objective   Blood pressure 111/77, pulse 69, temperature 98.5 F (36.9 C), resp. rate 18, height 5\' 6"  (1.676 m), weight 97.7 kg, SpO2 94%.   Intake/Output Summary (Last 24 hours) at 06/20/2023 1551 Last data filed at 06/20/2023 0800 Gross per 24 hour  Intake 720 ml  Output --  Net 720 ml   Exam Gen:- Awake Alert, no acute distress  HEENT:- .AT, No sclera icterus Neck-Supple Neck,No JVD,.  Lungs-fair air movement, no wheezing CV- S1, S2 normal, regular Abd-  +ve B.Sounds, Abd Soft, No tenderness, no CVA area tenderness    Extremity/Skin:- No  edema,   good pulses Psych-affect is appropriate, oriented x3 Neuro-no new focal deficits, no tremors    Data Review   CBC w Diff:  Lab Results  Component Value Date   WBC 16.9 (H) 06/20/2023   HGB 14.5 06/20/2023   HCT 42.2 06/20/2023   PLT 233 06/20/2023   LYMPHOPCT 10 06/18/2023   MONOPCT 13 06/18/2023   EOSPCT 0 06/18/2023   BASOPCT 1 06/18/2023    CMP:  Lab Results  Component Value Date   NA 134 (L) 06/19/2023   NA 138 07/01/2021   K 3.4 (L) 06/19/2023   CL 101 06/19/2023   CO2 24 06/19/2023   BUN 11 06/19/2023   BUN 12 07/01/2021   CREATININE 0.75 06/19/2023   PROT 8.3 (H) 07/20/2018   ALBUMIN 4.0 07/20/2018   BILITOT 1.3 (H) 07/20/2018   ALKPHOS 61 07/20/2018   AST 18 07/20/2018   ALT 23 07/20/2018  .  Total Discharge time is about 33 minutes  Shon Hale M.D on 06/20/2023 at 3:51 PM  Go to  [email or web address] -  for contact info  [facility] - Office  [phone]

## 2023-06-20 NOTE — Discharge Instructions (Addendum)
1)Take  Keflex and Azithromycin antibiotic as prescribed 2) Repeat CBC and BMP blood test with primary care physician in 1 week 3)Please Note that there has been some changes to your medications

## 2023-06-20 NOTE — Progress Notes (Signed)
Mobility Specialist Progress Note:    06/20/23 1015  Mobility  Activity Ambulated with assistance in hallway  Level of Assistance Independent  Assistive Device None  Distance Ambulated (ft) 250 ft  Range of Motion/Exercises Active;All extremities  Activity Response Tolerated well  Mobility Referral Yes  $Mobility charge 1 Mobility  Mobility Specialist Start Time (ACUTE ONLY) 1015  Mobility Specialist Stop Time (ACUTE ONLY) 1025  Mobility Specialist Time Calculation (min) (ACUTE ONLY) 10 min   Pt received in bed, agreeable to mobility. Independently able to stand and ambulate with no AD. Tolerated well, asx throughout. Returned pt to room, left sitting EOB. MD in room, all needs met.   Lawerance Bach Mobility Specialist Please contact via Special educational needs teacher or  Rehab office at [phone]

## 2023-06-23 ENCOUNTER — Other Ambulatory Visit: Payer: Self-pay

## 2023-06-23 ENCOUNTER — Emergency Department (HOSPITAL_COMMUNITY)
Admission: EM | Admit: 2023-06-23 | Discharge: 2023-06-24 | Disposition: A | Discharge status: Home / Self Care | Payer: 59 | Attending: Student | Admitting: Student

## 2023-06-23 ENCOUNTER — Emergency Department (HOSPITAL_COMMUNITY): Payer: 59

## 2023-06-23 ENCOUNTER — Encounter (HOSPITAL_COMMUNITY): Payer: Self-pay

## 2023-06-23 DIAGNOSIS — D72829 Elevated white blood cell count, unspecified: Secondary | ICD-10-CM | POA: Diagnosis not present

## 2023-06-23 DIAGNOSIS — Z87891 Personal history of nicotine dependence: Secondary | ICD-10-CM | POA: Insufficient documentation

## 2023-06-23 DIAGNOSIS — M47812 Spondylosis without myelopathy or radiculopathy, cervical region: Secondary | ICD-10-CM | POA: Diagnosis not present

## 2023-06-23 DIAGNOSIS — Z79899 Other long term (current) drug therapy: Secondary | ICD-10-CM | POA: Insufficient documentation

## 2023-06-23 DIAGNOSIS — E119 Type 2 diabetes mellitus without complications: Secondary | ICD-10-CM | POA: Diagnosis not present

## 2023-06-23 DIAGNOSIS — I10 Essential (primary) hypertension: Secondary | ICD-10-CM | POA: Diagnosis not present

## 2023-06-23 DIAGNOSIS — M542 Cervicalgia: Secondary | ICD-10-CM

## 2023-06-23 DIAGNOSIS — Z7984 Long term (current) use of oral hypoglycemic drugs: Secondary | ICD-10-CM | POA: Diagnosis not present

## 2023-06-23 DIAGNOSIS — S161XXA Strain of muscle, fascia and tendon at neck level, initial encounter: Secondary | ICD-10-CM | POA: Diagnosis not present

## 2023-06-23 DIAGNOSIS — X58XXXA Exposure to other specified factors, initial encounter: Secondary | ICD-10-CM | POA: Diagnosis not present

## 2023-06-23 DIAGNOSIS — M4802 Spinal stenosis, cervical region: Secondary | ICD-10-CM | POA: Diagnosis not present

## 2023-06-23 LAB — CULTURE, BLOOD (ROUTINE X 2)
Culture: NO GROWTH
Culture: NO GROWTH
Special Requests: ADEQUATE

## 2023-06-23 MED ORDER — KETOROLAC TROMETHAMINE 15 MG/ML IJ SOLN
15.0000 mg | Freq: Once | INTRAMUSCULAR | Status: AC
  Administered 2023-06-23: 15 mg via INTRAMUSCULAR
  Filled 2023-06-23: qty 1

## 2023-06-23 NOTE — ED Triage Notes (Signed)
Neck pain, both sides of neck, travels down both arms, tinkling in fingers  Started Thursday night Took tylenol at home without help 10/10 pain

## 2023-06-24 DIAGNOSIS — M47812 Spondylosis without myelopathy or radiculopathy, cervical region: Secondary | ICD-10-CM | POA: Diagnosis not present

## 2023-06-24 DIAGNOSIS — M542 Cervicalgia: Secondary | ICD-10-CM | POA: Diagnosis not present

## 2023-06-24 DIAGNOSIS — M4802 Spinal stenosis, cervical region: Secondary | ICD-10-CM | POA: Diagnosis not present

## 2023-06-24 DIAGNOSIS — S161XXA Strain of muscle, fascia and tendon at neck level, initial encounter: Secondary | ICD-10-CM | POA: Diagnosis not present

## 2023-06-24 LAB — CBC WITH DIFFERENTIAL/PLATELET
Abs Immature Granulocytes: 0.06 10*3/uL (ref 0.00–0.07)
Basophils Absolute: 0.1 10*3/uL (ref 0.0–0.1)
Basophils Relative: 1 %
Eosinophils Absolute: 0.2 10*3/uL (ref 0.0–0.5)
Eosinophils Relative: 2 %
HCT: 46.6 % (ref 39.0–52.0)
Hemoglobin: 15.6 g/dL (ref 13.0–17.0)
Immature Granulocytes: 1 %
Lymphocytes Relative: 24 %
Lymphs Abs: 2.6 10*3/uL (ref 0.7–4.0)
MCH: 32.1 pg (ref 26.0–34.0)
MCHC: 33.5 g/dL (ref 30.0–36.0)
MCV: 95.9 fL (ref 80.0–100.0)
Monocytes Absolute: 2 10*3/uL — ABNORMAL HIGH (ref 0.1–1.0)
Monocytes Relative: 18 %
Neutro Abs: 6 10*3/uL (ref 1.7–7.7)
Neutrophils Relative %: 54 %
Platelets: 270 10*3/uL (ref 150–400)
RBC: 4.86 MIL/uL (ref 4.22–5.81)
RDW: 14.1 % (ref 11.5–15.5)
WBC: 11 10*3/uL — ABNORMAL HIGH (ref 4.0–10.5)
nRBC: 0 % (ref 0.0–0.2)

## 2023-06-24 LAB — COMPREHENSIVE METABOLIC PANEL
ALT: 41 U/L (ref 0–44)
AST: 31 U/L (ref 15–41)
Albumin: 3.5 g/dL (ref 3.5–5.0)
Alkaline Phosphatase: 77 U/L (ref 38–126)
Anion gap: 11 (ref 5–15)
BUN: 13 mg/dL (ref 8–23)
CO2: 23 mmol/L (ref 22–32)
Calcium: 9.3 mg/dL (ref 8.9–10.3)
Chloride: 100 mmol/L (ref 98–111)
Creatinine, Ser: 0.72 mg/dL (ref 0.61–1.24)
GFR, Estimated: >60 mL/min (ref >60)
Glucose, Bld: 129 mg/dL — ABNORMAL HIGH (ref 70–99)
Potassium: 3.8 mmol/L (ref 3.5–5.1)
Sodium: 134 mmol/L — ABNORMAL LOW (ref 135–145)
Total Bilirubin: 0.5 mg/dL (ref 0.3–1.2)
Total Protein: 7.7 g/dL (ref 6.5–8.1)

## 2023-06-24 MED ORDER — LIDOCAINE 5 % EX PTCH
1.0000 | MEDICATED_PATCH | CUTANEOUS | Status: DC
  Administered 2023-06-24: 1 via TRANSDERMAL
  Filled 2023-06-24: qty 1

## 2023-06-24 MED ORDER — LIDOCAINE 4 % EX PTCH: 1.0000 | MEDICATED_PATCH | Freq: Two times a day (BID) | CUTANEOUS | 0 refills | Status: AC

## 2023-06-24 MED ORDER — NAPROXEN 375 MG PO TABS: 375.0000 mg | ORAL_TABLET | Freq: Two times a day (BID) | ORAL | 0 refills | Status: DC

## 2023-06-24 NOTE — ED Provider Notes (Signed)
[place] EMERGENCY DEPARTMENT AT [facility] Provider Note  CSN: [id number] Arrival date & time: 06/23/23 2226  Chief Complaint(s) Neck Pain  HPI Jason Sweeney is a 67 y.o. male with PMH AAA, HTN, recent hospital discharge on 06/20/2023 for urosepsis who presents emergency room for evaluation of neck and shoulder pain.  Patient states that pain has been present for the last 48 hours primarily over the scapula on the right and in the neck with some tingling of both hands.  Denies weakness of the upper extremities.  Denies associated shortness of breath, chest pain, abdominal pain, nausea, vomiting or other systemic symptoms.  States that this has happened 3 times in the last few years.   Past Medical History Past Medical History:  Diagnosis Date   AAA (abdominal aortic aneurysm) (HCC)    AKI (acute kidney injury) (HCC) 12/14/2017   Chronic neck pain    HTN (hypertension) 12/14/2017   SVT (supraventricular tachycardia) (HCC) 12/14/2017   Patient Active Problem List   Diagnosis Date Noted   Severe sepsis (HCC) 06/18/2023   UTI (urinary tract infection) 06/18/2023   DM (diabetes mellitus) (HCC) 06/18/2023   AAA (abdominal aortic aneurysm) without rupture (HCC) 12/15/2017   Abnormal [place] (ultrasound) of abdomen    SVT (supraventricular tachycardia) (HCC) 12/14/2017   AKI (acute kidney injury) (HCC) 12/14/2017   HTN (hypertension) 12/14/2017   Elevated troponin 12/14/2017   Home Medication(s) Prior to Admission medications   Medication Sig Start Date End Date Taking? Authorizing Provider  lidocaine (SALONPAS PAIN RELIEVING) 4 % Place 1 patch onto the skin every 12 (twelve) hours. 06/24/23  Yes Spenser Harren, MD  naproxen (NAPROSYN) 375 MG tablet Take 1 tablet (375 mg total) by mouth 2 (two) times daily. 06/24/23  Yes Felipe Cabell, MD  acetaminophen (TYLENOL) 325 MG tablet Take 650 mg by mouth as needed.    [provider]  azithromycin (ZITHROMAX) 500 MG  tablet Take 1 tablet (500 mg total) by mouth daily for 3 days. 06/21/23 06/24/23  Shon Hale, MD  carvedilol (COREG) 3.125 MG tablet Take 1 tablet (3.125 mg total) by mouth 2 (two) times daily. 05/30/22   Dyann Kief, PA-C  cephALEXin (KEFLEX) 500 MG capsule Take 1 capsule (500 mg total) by mouth 3 (three) times daily for 5 days. 06/20/23 06/25/23  Shon Hale, MD  furosemide (LASIX) 20 MG tablet Take 1 tablet (20 mg total) by mouth daily for 7 days. 06/20/23 06/27/23  Shon Hale, MD  losartan (COZAAR) 25 MG tablet Take 1 tablet (25 mg total) by mouth daily. 05/30/22   Dyann Kief, PA-C  metFORMIN (GLUCOPHAGE-XR) 500 MG 24 hr tablet Take 500 mg by mouth 2 (two) times daily. 12/21/22   [provider]  Potassium Chloride ER 20 MEQ TBCR Take 1 tablet (20 mEq total) by mouth daily for 7 days. -- Take while taking Furosemide/Lasix 06/20/23 06/27/23  Shon Hale, MD  Past Surgical History Past Surgical History:  Procedure Laterality Date   ABDOMINAL SURGERY     SHOULDER SURGERY Left    SHOULDER SURGERY     SVT ABLATION N/A 02/09/2018   Procedure: SVT ABLATION;  Surgeon: Marinus Maw, MD;  Location: [facility] INVASIVE CV LAB;  Service: Cardiovascular;  Laterality: N/A;   Family History Family History  Problem Relation Age of Onset   Hypertension Mother    Heart disease Mother     Social History Social History   Tobacco Use   Smoking status: Former    Current packs/day: 0.00    Types: Cigarettes    Quit date: 08/29/1993    Years since quitting: 29.8   Smokeless tobacco: Never  Vaping Use   Vaping status: Never Used  Substance Use Topics   Alcohol use: Yes    Comment: occasionally   Drug use: Not Currently   Allergies Patient has no known allergies.  Review of Systems Review of Systems  Musculoskeletal:  Positive for  neck pain.    Physical Exam Vital Signs  I have reviewed the triage vital signs BP (!) 176/101 (BP Location: Left Arm)   Pulse 82   Temp 98.4 F (36.9 C) (Oral)   Resp 17   Ht 5\' 5"  (1.651 m)   Wt 97.5 kg   SpO2 95%   BMI 35.78 kg/m   Physical Exam Constitutional:      General: He is not in acute distress.    Appearance: Normal appearance.  HENT:     Head: Normocephalic and atraumatic.     Nose: No congestion or rhinorrhea.  Eyes:     General:        Right eye: No discharge.        Left eye: No discharge.     Extraocular Movements: Extraocular movements intact.     Pupils: Pupils are equal, round, and reactive to light.  Cardiovascular:     Rate and Rhythm: Normal rate and regular rhythm.     Heart sounds: No murmur heard. Pulmonary:     Effort: No respiratory distress.     Breath sounds: No wheezing or rales.  Abdominal:     General: There is no distension.     Tenderness: There is no abdominal tenderness.  Musculoskeletal:        General: Tenderness present. Normal range of motion.     Cervical back: Normal range of motion. Tenderness present.  Skin:    General: Skin is warm and dry.  Neurological:     General: No focal deficit present.     Mental Status: He is alert.     ED Results and Treatments Labs (all labs ordered are listed, but only abnormal results are displayed) Labs Reviewed  COMPREHENSIVE METABOLIC PANEL - Abnormal; Notable for the following components:      Result Value   Sodium 134 (*)    Glucose, Bld 129 (*)    All other components within normal limits  CBC WITH DIFFERENTIAL/PLATELET - Abnormal; Notable for the following components:   WBC 11.0 (*)    Monocytes Absolute 2.0 (*)    All other components within normal limits  Radiology CT Cervical Spine Wo Contrast  Result Date: 06/24/2023 CLINICAL DATA:  Neck pain  EXAM: CT CERVICAL SPINE WITHOUT CONTRAST TECHNIQUE: Multidetector CT imaging of the cervical spine was performed without intravenous contrast. Multiplanar CT image reconstructions were also generated. RADIATION DOSE REDUCTION: This exam was performed according to the departmental dose-optimization program which includes automated exposure control, adjustment of the mA and/or kV according to patient size and/or use of iterative reconstruction technique. COMPARISON:  CT 01/16/2019 FINDINGS: Alignment: Straightening of the cervical spine. No subluxation. Facet alignment is within normal limits. 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 No fracture. Soft tissues and spinal canal: No prevertebral fluid or swelling. No visible canal hematoma. Disc levels: Advanced disc space narrowing and degenerative change C4-C5, C5-C6 and C6-C7. Multilevel facet degenerative change with foraminal stenosis. Posterior disc osteophyte complex at C5-C6 and C6-C7 with mass effect on the ventral thecal sac. Canal appears narrowed at these levels. Upper chest: Negative. Other: None IMPRESSION: 1. Straightening of the cervical spine with multilevel degenerative change. No acute osseous abnormality. 2. 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 Electronically Signed   By: Jasmine Pang M.D.   On: 06/24/2023 01:58    Pertinent labs & imaging results that were available during my care of the patient were reviewed by me and considered in my medical decision making (see MDM for details).  Medications Ordered in ED Medications  lidocaine (LIDODERM) 5 % 1 patch (1 patch Transdermal Patch Applied 06/24/23 0246)  ketorolac (TORADOL) 15 MG/ML injection 15 mg (15 mg Intramuscular Given 06/23/23 2350)                                                                                                                                     Procedures Procedures  (including critical care time)  Medical  Decision Making / ED Course   This patient presents to the ED for concern of neck pain, this involves an extensive number of treatment options, and is a complaint that carries with it a high risk of complications and morbidity.  The differential diagnosis includes cervical strain, cervical stenosis, radiculopathy, muscle spasm, torticollis, fracture  MDM: Patient seen emergency room for evaluation of neck pain.  Physical exam with reproducible tenderness over the trapezius on the right, minimal bony tenderness to palpation.  Patient has full range of motion of the neck.  Neurologic exam largely unremarkable with no focal motor or sensory deficits.  Patient has good to point discrimination on bilateral upper extremities.  No weakness of the upper extremities.  Laboratory evaluation with a mild leukocytosis to 11.0 which is appropriately downtrending from hospital discharge but is otherwise unremarkable.  CT C-spine with no significant cervical stenosis, multilevel degenerative changes.  Symptoms controlled with lidocaine patch and Toradol and on reevaluation pain is significant improved.  Presentation consistent with cervical strain and patient does not meet inpatient criteria for admission at this  time.  Patient discharged with Naprosyn and return precautions given which he and his son voiced understanding.   Additional history obtained: -Additional history obtained from son -External records from outside source obtained and reviewed including: Chart review including previous notes, labs, imaging, consultation notes   Lab Tests: -I ordered, reviewed, and interpreted labs.   The pertinent results include:   Labs Reviewed  COMPREHENSIVE METABOLIC PANEL - Abnormal; Notable for the following components:      Result Value   Sodium 134 (*)    Glucose, Bld 129 (*)    All other components within normal limits  CBC WITH DIFFERENTIAL/PLATELET - Abnormal; Notable for the following components:   WBC 11.0  (*)    Monocytes Absolute 2.0 (*)    All other components within normal limits     Imaging Studies ordered: I ordered imaging studies including CT C-spine I independently visualized and interpreted imaging. I agree with the radiologist interpretation   Medicines ordered and prescription drug management: Meds ordered this encounter  Medications   ketorolac (TORADOL) 15 MG/ML injection 15 mg   lidocaine (LIDODERM) 5 % 1 patch   naproxen (NAPROSYN) 375 MG tablet    Sig: Take 1 tablet (375 mg total) by mouth 2 (two) times daily.    Dispense:  20 tablet    Refill:  0   lidocaine (SALONPAS PAIN RELIEVING) 4 %    Sig: Place 1 patch onto the skin every 12 (twelve) hours.    Dispense:  15 patch    Refill:  0    -I have reviewed the patients home medicines and have made adjustments as needed  Critical interventions none   Social Determinants of Health:  Factors impacting patients care include: Spanish-speaking   Reevaluation: After the interventions noted above, I reevaluated the patient and found that they have :improved  Co morbidities that complicate the patient evaluation  Past Medical History:  Diagnosis Date   AAA (abdominal aortic aneurysm) (HCC)    AKI (acute kidney injury) (HCC) 12/14/2017   Chronic neck pain    HTN (hypertension) 12/14/2017   SVT (supraventricular tachycardia) (HCC) 12/14/2017      Dispostion: I considered admission for this patient, but at this time he does not meet inpatient criteria for admission he is safe for discharge with outpatient follow-up     Final Clinical Impression(s) / ED Diagnoses Final diagnoses:  Neck pain  Strain of neck muscle, initial encounter     @PCDICTATION @    Gillermo Poch, Wyn Forster, MD 06/24/23 [phone]

## 2023-06-28 DIAGNOSIS — R739 Hyperglycemia, unspecified: Secondary | ICD-10-CM | POA: Diagnosis not present

## 2023-06-28 DIAGNOSIS — I10 Essential (primary) hypertension: Secondary | ICD-10-CM | POA: Diagnosis not present

## 2023-06-28 DIAGNOSIS — E039 Hypothyroidism, unspecified: Secondary | ICD-10-CM | POA: Diagnosis not present

## 2023-06-28 DIAGNOSIS — I714 Abdominal aortic aneurysm, without rupture, unspecified: Secondary | ICD-10-CM | POA: Diagnosis not present

## 2023-07-03 ENCOUNTER — Ambulatory Visit (HOSPITAL_COMMUNITY)
Admission: RE | Admit: 2023-07-03 | Discharge: 2023-07-03 | Disposition: A | Discharge status: Home / Self Care | Payer: 59 | Source: Ambulatory Visit | Attending: Surgery | Admitting: Surgery

## 2023-07-03 ENCOUNTER — Ambulatory Visit (INDEPENDENT_AMBULATORY_CARE_PROVIDER_SITE_OTHER): Payer: 59 | Admitting: Surgery

## 2023-07-03 ENCOUNTER — Other Ambulatory Visit: Payer: Self-pay

## 2023-07-03 ENCOUNTER — Encounter: Payer: Self-pay | Admitting: Surgery

## 2023-07-03 VITALS — BP 155/89 | HR 53 | Temp 98.5°F | Resp 20 | Ht 65.0 in | Wt 217.0 lb

## 2023-07-03 DIAGNOSIS — I7143 Infrarenal abdominal aortic aneurysm, without rupture: Secondary | ICD-10-CM

## 2023-07-03 NOTE — H&P (View-Only) (Signed)
 [facility]  Patient name: Jason Sweeney MRN: [id number] DOB: 11-01-1955 Sex: male   REASON FOR VISIT:    Follow-up  HISOTRY OF PRESENT ILLNESS:    Jason Sweeney is a 67 y.o. male who is a former patient of Dr. Edilia Bo who last saw him in June 2024 for a 5.2 cm abdominal aortic aneurysm based on a CT scan.  He had recommended repair at 5.5 cm.  He is back today for ultrasound surveillance  The patient is medically managed for hypertension which has been under good control.  He is a former smoker having quit over 25 years ago.  He does have some dyspnea on exertion.   PAST MEDICAL HISTORY:   Past Medical History:  Diagnosis Date   AAA (abdominal aortic aneurysm) (HCC)    AKI (acute kidney injury) (HCC) 12/14/2017   Chronic neck pain    HTN (hypertension) 12/14/2017   SVT (supraventricular tachycardia) (HCC) 12/14/2017     FAMILY HISTORY:   Family History  Problem Relation Age of Onset   Hypertension Mother    Heart disease Mother     SOCIAL HISTORY:   Social History   Tobacco Use   Smoking status: Former    Current packs/day: 0.00    Types: Cigarettes    Quit date: 08/29/1993    Years since quitting: 29.8   Smokeless tobacco: Never  Substance Use Topics   Alcohol use: Yes    Comment: occasionally     ALLERGIES:   No Known Allergies   CURRENT MEDICATIONS:   Current Outpatient Medications  Medication Sig Dispense Refill   acetaminophen (TYLENOL) 325 MG tablet Take 650 mg by mouth as needed.     carvedilol (COREG) 3.125 MG tablet Take 1 tablet (3.125 mg total) by mouth 2 (two) times daily. 540 tablet 0   furosemide (LASIX) 20 MG tablet Take 1 tablet (20 mg total) by mouth daily for 7 days. 7 tablet 0   lidocaine (SALONPAS PAIN RELIEVING) 4 % Place 1 patch onto the skin every 12 (twelve) hours. 15 patch 0   losartan (COZAAR) 25 MG tablet Take 1 tablet (25 mg total) by mouth daily. 365 tablet  0   metFORMIN (GLUCOPHAGE-XR) 500 MG 24 hr tablet Take 500 mg by mouth 2 (two) times daily.     naproxen (NAPROSYN) 375 MG tablet Take 1 tablet (375 mg total) by mouth 2 (two) times daily. 20 tablet 0   Potassium Chloride ER 20 MEQ TBCR Take 1 tablet (20 mEq total) by mouth daily for 7 days. -- Take while taking Furosemide/Lasix 7 tablet 0   No current facility-administered medications for this visit.    REVIEW OF SYSTEMS:   [X]  denotes positive finding, [ ]  denotes negative finding Cardiac  Comments:  Chest pain or chest pressure:    Shortness of breath upon exertion:    Short of breath when lying flat:    Irregular heart rhythm:        Vascular    Pain in calf, thigh, or hip brought on by ambulation:    Pain in feet at night that wakes you up from your sleep:     Blood clot in your veins:    Leg swelling:         Pulmonary    Oxygen at home:    Productive cough:     Wheezing:         Neurologic    Sudden weakness in arms or legs:  Sudden numbness in arms or legs:     Sudden onset of difficulty speaking or slurred speech:    Temporary loss of vision in one eye:     Problems with dizziness:         Gastrointestinal    Blood in stool:     Vomited blood:         Genitourinary    Burning when urinating:     Blood in urine:        Psychiatric    Major depression:         Hematologic    Bleeding problems:    Problems with blood clotting too easily:        Skin    Rashes or ulcers:        Constitutional    Fever or chills:      PHYSICAL EXAM:   There were no vitals filed for this visit.  GENERAL: The patient is a well-nourished male, in no acute distress. The vital signs are documented above. CARDIAC: There is a regular rate and rhythm.  VASCULAR: Palpable pedal pulses.  No carotid bruits. PULMONARY: Non-labored respirations ABDOMEN: Soft and non-tender   MUSCULOSKELETAL: There are no major deformities or cyanosis. NEUROLOGIC: No focal weakness or  paresthesias are detected. SKIN: There are no ulcers or rashes noted. PSYCHIATRIC: The patient has a normal affect.  STUDIES:   I have reviewed the following ultrasound: Abdominal Aorta Findings:  +-----------+-------+----------+----------+--------+--------+--------+  Location  AP (cm)Trans (cm)PSV (cm/s)WaveformThrombusComments  +-----------+-------+----------+----------+--------+--------+--------+  Proximal  2.51   2.64      113                                 +-----------+-------+----------+----------+--------+--------+--------+  Mid       1.76   1.78      79                                  +-----------+-------+----------+----------+--------+--------+--------+  Distal    5.46   5.48      129                                 +-----------+-------+----------+----------+--------+--------+--------+  RT CIA Prox1.3    1.4       95                                  +-----------+-------+----------+----------+--------+--------+--------+  LT CIA Prox1.7    1.8       101                                 +-----------+-------+----------+----------+--------+--------+--------+    MEDICAL ISSUES:   Abdominal aortic aneurysm: Maximum diameter is 5.5 cm.  He is a good candidate for percutaneous endovascular repair.  We discussed the risk of major complications.  We discussed the risks of rupture.  All of his questions were answered.  He wants to proceed immediately.  This has been scheduled for Wednesday, November 13.  He has not had a preoperative carotid ultrasound and so I will get that while he is in the hospital.    Durene Cal, IV, MD, FACS [facility] Tel [phone] Pager [phone]

## 2023-07-03 NOTE — Progress Notes (Signed)
[facility]  Patient name: Jason Sweeney MRN: [id number] DOB: 11-01-1955 Sex: male   REASON FOR VISIT:    Follow-up  HISOTRY OF PRESENT ILLNESS:    Jason Sweeney is a 67 y.o. male who is a former patient of Dr. Edilia Bo who last saw him in June 2024 for a 5.2 cm abdominal aortic aneurysm based on a CT scan.  He had recommended repair at 5.5 cm.  He is back today for ultrasound surveillance  The patient is medically managed for hypertension which has been under good control.  He is a former smoker having quit over 25 years ago.  He does have some dyspnea on exertion.   PAST MEDICAL HISTORY:   Past Medical History:  Diagnosis Date   AAA (abdominal aortic aneurysm) (HCC)    AKI (acute kidney injury) (HCC) 12/14/2017   Chronic neck pain    HTN (hypertension) 12/14/2017   SVT (supraventricular tachycardia) (HCC) 12/14/2017     FAMILY HISTORY:   Family History  Problem Relation Age of Onset   Hypertension Mother    Heart disease Mother     SOCIAL HISTORY:   Social History   Tobacco Use   Smoking status: Former    Current packs/day: 0.00    Types: Cigarettes    Quit date: 08/29/1993    Years since quitting: 29.8   Smokeless tobacco: Never  Substance Use Topics   Alcohol use: Yes    Comment: occasionally     ALLERGIES:   No Known Allergies   CURRENT MEDICATIONS:   Current Outpatient Medications  Medication Sig Dispense Refill   acetaminophen (TYLENOL) 325 MG tablet Take 650 mg by mouth as needed.     carvedilol (COREG) 3.125 MG tablet Take 1 tablet (3.125 mg total) by mouth 2 (two) times daily. 540 tablet 0   furosemide (LASIX) 20 MG tablet Take 1 tablet (20 mg total) by mouth daily for 7 days. 7 tablet 0   lidocaine (SALONPAS PAIN RELIEVING) 4 % Place 1 patch onto the skin every 12 (twelve) hours. 15 patch 0   losartan (COZAAR) 25 MG tablet Take 1 tablet (25 mg total) by mouth daily. 365 tablet  0   metFORMIN (GLUCOPHAGE-XR) 500 MG 24 hr tablet Take 500 mg by mouth 2 (two) times daily.     naproxen (NAPROSYN) 375 MG tablet Take 1 tablet (375 mg total) by mouth 2 (two) times daily. 20 tablet 0   Potassium Chloride ER 20 MEQ TBCR Take 1 tablet (20 mEq total) by mouth daily for 7 days. -- Take while taking Furosemide/Lasix 7 tablet 0   No current facility-administered medications for this visit.    REVIEW OF SYSTEMS:   [X]  denotes positive finding, [ ]  denotes negative finding Cardiac  Comments:  Chest pain or chest pressure:    Shortness of breath upon exertion:    Short of breath when lying flat:    Irregular heart rhythm:        Vascular    Pain in calf, thigh, or hip brought on by ambulation:    Pain in feet at night that wakes you up from your sleep:     Blood clot in your veins:    Leg swelling:         Pulmonary    Oxygen at home:    Productive cough:     Wheezing:         Neurologic    Sudden weakness in arms or legs:  Sudden numbness in arms or legs:     Sudden onset of difficulty speaking or slurred speech:    Temporary loss of vision in one eye:     Problems with dizziness:         Gastrointestinal    Blood in stool:     Vomited blood:         Genitourinary    Burning when urinating:     Blood in urine:        Psychiatric    Major depression:         Hematologic    Bleeding problems:    Problems with blood clotting too easily:        Skin    Rashes or ulcers:        Constitutional    Fever or chills:      PHYSICAL EXAM:   There were no vitals filed for this visit.  GENERAL: The patient is a well-nourished male, in no acute distress. The vital signs are documented above. CARDIAC: There is a regular rate and rhythm.  VASCULAR: Palpable pedal pulses.  No carotid bruits. PULMONARY: Non-labored respirations ABDOMEN: Soft and non-tender   MUSCULOSKELETAL: There are no major deformities or cyanosis. NEUROLOGIC: No focal weakness or  paresthesias are detected. SKIN: There are no ulcers or rashes noted. PSYCHIATRIC: The patient has a normal affect.  STUDIES:   I have reviewed the following ultrasound: Abdominal Aorta Findings:  +-----------+-------+----------+----------+--------+--------+--------+  Location  AP (cm)Trans (cm)PSV (cm/s)WaveformThrombusComments  +-----------+-------+----------+----------+--------+--------+--------+  Proximal  2.51   2.64      113                                 +-----------+-------+----------+----------+--------+--------+--------+  Mid       1.76   1.78      79                                  +-----------+-------+----------+----------+--------+--------+--------+  Distal    5.46   5.48      129                                 +-----------+-------+----------+----------+--------+--------+--------+  RT CIA Prox1.3    1.4       95                                  +-----------+-------+----------+----------+--------+--------+--------+  LT CIA Prox1.7    1.8       101                                 +-----------+-------+----------+----------+--------+--------+--------+    MEDICAL ISSUES:   Abdominal aortic aneurysm: Maximum diameter is 5.5 cm.  He is a good candidate for percutaneous endovascular repair.  We discussed the risk of major complications.  We discussed the risks of rupture.  All of his questions were answered.  He wants to proceed immediately.  This has been scheduled for Wednesday, November 13.  He has not had a preoperative carotid ultrasound and so I will get that while he is in the hospital.    Durene Cal, IV, MD, FACS [facility] Tel [phone] Pager [phone]

## 2023-07-05 NOTE — Progress Notes (Signed)
Surgical Instructions   Your procedure is scheduled on Wednesday, July 12, 2023. Report to [facility] Main Entrance "A" at 6:30 A.M., then check in with the Admitting office. Any questions or running late day of surgery: call [phone]  Questions prior to your surgery date: call [phone], Monday-Friday, 8am-4pm. If you experience any cold or flu symptoms such as cough, fever, chills, shortness of breath, etc. between now and your scheduled surgery, please notify us at the above number.     Remember:  Do not eat or drink after midnight the night before your surgery   Take these medicines the morning of surgery with A SIP OF WATER  amLODipine (NORVASC)  carvedilol (COREG)    May take these medicines IF NEEDED: acetaminophen (TYLENOL)   DO NOT TAKE YOUR metFORMIN (GLUCOPHAGE-XR) THE DAY OF SURGERY.     One week prior to surgery, STOP taking any Aspirin (unless otherwise instructed by your surgeon) Aleve, Naproxen, Ibuprofen, Motrin, Advil, Goody's, BC's, all herbal medications, fish oil, and non-prescription vitamins.                     Do NOT Smoke (Tobacco/Vaping) for 24 hours prior to your procedure.  If you use a CPAP at night, you may bring your mask/headgear for your overnight stay.   You will be asked to remove any contacts, glasses, piercing's, hearing aid's, dentures/partials prior to surgery. Please bring cases for these items if needed.    Patients discharged the day of surgery will not be allowed to drive home, and someone needs to stay with them for 24 hours.  SURGICAL WAITING ROOM VISITATION Patients may have no more than 2 support people in the waiting area - these visitors may rotate.   Pre-op nurse will coordinate an appropriate time for 1 ADULT support person, who may not rotate, to accompany patient in pre-op.  Children under the age of 83 must have an adult with them who is not the patient and must remain in the main waiting area with an  adult.  If the patient needs to stay at the hospital during part of their recovery, the visitor guidelines for inpatient rooms apply.  Please refer to the [facility] website for the visitor guidelines for any additional information.   If you received a COVID test during your pre-op visit  it is requested that you wear a mask when out in public, stay away from anyone that may not be feeling well and notify your surgeon if you develop symptoms. If you have been in contact with anyone that has tested positive in the last 10 days please notify you surgeon.      Pre-operative CHG Bathing Instructions   You can play a key role in reducing the risk of infection after surgery. Your skin needs to be as free of germs as possible. You can reduce the number of germs on your skin by washing with CHG (chlorhexidine gluconate) soap before surgery. CHG is an antiseptic soap that kills germs and continues to kill germs even after washing.   DO NOT use if you have an allergy to chlorhexidine/CHG or antibacterial soaps. If your skin becomes reddened or irritated, stop using the CHG and notify one of our RNs at [phone].              TAKE A SHOWER THE NIGHT BEFORE SURGERY AND THE DAY OF SURGERY    Please keep in mind the following:  DO NOT shave, including legs and  underarms, 48 hours prior to surgery.   You may shave your face before/day of surgery.  Place clean sheets on your bed the night before surgery Use a clean washcloth (not used since being washed) for each shower. DO NOT sleep with pet's night before surgery.  CHG Shower Instructions:  Wash your face and private area with normal soap. If you choose to wash your hair, wash first with your normal shampoo.  After you use shampoo/soap, rinse your hair and body thoroughly to remove shampoo/soap residue.  Turn the water OFF and apply half the bottle of CHG soap to a CLEAN washcloth.  Apply CHG soap ONLY FROM YOUR NECK DOWN TO YOUR TOES (washing  for 3-5 minutes)  DO NOT use CHG soap on face, private areas, open wounds, or sores.  Pay special attention to the area where your surgery is being performed.  If you are having back surgery, having someone wash your back for you may be helpful. Wait 2 minutes after CHG soap is applied, then you may rinse off the CHG soap.  Pat dry with a clean towel  Put on clean pajamas    Additional instructions for the day of surgery: DO NOT APPLY any lotions, deodorants or cologne.    Do not wear jewelry Do not bring valuables to the hospital. [facility] is not responsible for valuables/personal belongings. Put on clean/comfortable clothes.  Please brush your teeth.  Ask your nurse before applying any prescription medications to the skin.

## 2023-07-06 ENCOUNTER — Encounter (HOSPITAL_COMMUNITY): Payer: Self-pay

## 2023-07-06 ENCOUNTER — Other Ambulatory Visit: Payer: Self-pay

## 2023-07-06 ENCOUNTER — Encounter (HOSPITAL_COMMUNITY)
Admission: RE | Admit: 2023-07-06 | Discharge: 2023-07-06 | Disposition: A | Discharge status: Home / Self Care | Payer: 59 | Source: Ambulatory Visit | Attending: Surgery | Admitting: Surgery

## 2023-07-06 VITALS — BP 165/97 | HR 74 | Temp 98.5°F | Resp 17 | Ht 65.0 in | Wt 215.0 lb

## 2023-07-06 DIAGNOSIS — I7143 Infrarenal abdominal aortic aneurysm, without rupture: Secondary | ICD-10-CM

## 2023-07-06 DIAGNOSIS — R82998 Other abnormal findings in urine: Secondary | ICD-10-CM

## 2023-07-06 DIAGNOSIS — Z01812 Encounter for preprocedural laboratory examination: Secondary | ICD-10-CM | POA: Diagnosis not present

## 2023-07-06 DIAGNOSIS — Z7984 Long term (current) use of oral hypoglycemic drugs: Secondary | ICD-10-CM | POA: Diagnosis not present

## 2023-07-06 DIAGNOSIS — Z01818 Encounter for other preprocedural examination: Secondary | ICD-10-CM

## 2023-07-06 DIAGNOSIS — R7401 Elevation of levels of liver transaminase levels: Secondary | ICD-10-CM | POA: Insufficient documentation

## 2023-07-06 DIAGNOSIS — Z87891 Personal history of nicotine dependence: Secondary | ICD-10-CM | POA: Insufficient documentation

## 2023-07-06 DIAGNOSIS — E119 Type 2 diabetes mellitus without complications: Secondary | ICD-10-CM | POA: Diagnosis not present

## 2023-07-06 DIAGNOSIS — Z8679 Personal history of other diseases of the circulatory system: Secondary | ICD-10-CM | POA: Diagnosis not present

## 2023-07-06 DIAGNOSIS — I10 Essential (primary) hypertension: Secondary | ICD-10-CM | POA: Diagnosis not present

## 2023-07-06 HISTORY — DX: Personal history of urinary calculi: Z87.442

## 2023-07-06 HISTORY — DX: Type 2 diabetes mellitus without complications: E11.9

## 2023-07-06 LAB — COMPREHENSIVE METABOLIC PANEL
ALT: 45 U/L — ABNORMAL HIGH (ref 0–44)
AST: 46 U/L — ABNORMAL HIGH (ref 15–41)
Albumin: 3.7 g/dL (ref 3.5–5.0)
Alkaline Phosphatase: 59 U/L (ref 38–126)
Anion gap: 10 (ref 5–15)
BUN: 14 mg/dL (ref 8–23)
CO2: 25 mmol/L (ref 22–32)
Calcium: 9.3 mg/dL (ref 8.9–10.3)
Chloride: 101 mmol/L (ref 98–111)
Creatinine, Ser: 0.81 mg/dL (ref 0.61–1.24)
GFR, Estimated: >60 mL/min (ref >60)
Glucose, Bld: 104 mg/dL — ABNORMAL HIGH (ref 70–99)
Potassium: 4.7 mmol/L (ref 3.5–5.1)
Sodium: 136 mmol/L (ref 135–145)
Total Bilirubin: 1.3 mg/dL — ABNORMAL HIGH (ref <1.2)
Total Protein: 7.3 g/dL (ref 6.5–8.1)

## 2023-07-06 LAB — SURGICAL PCR SCREEN
MRSA, PCR: NEGATIVE
Staphylococcus aureus: NEGATIVE

## 2023-07-06 LAB — PROTIME-INR
INR: 1.1 (ref 0.8–1.2)
Prothrombin Time: 14.2 s (ref 11.4–15.2)

## 2023-07-06 LAB — CBC
HCT: 49.4 % (ref 39.0–52.0)
Hemoglobin: 16.3 g/dL (ref 13.0–17.0)
MCH: 31.5 pg (ref 26.0–34.0)
MCHC: 33 g/dL (ref 30.0–36.0)
MCV: 95.4 fL (ref 80.0–100.0)
Platelets: 319 10*3/uL (ref 150–400)
RBC: 5.18 MIL/uL (ref 4.22–5.81)
RDW: 14.2 % (ref 11.5–15.5)
WBC: 7.1 10*3/uL (ref 4.0–10.5)
nRBC: 0 % (ref 0.0–0.2)

## 2023-07-06 LAB — URINALYSIS, ROUTINE W REFLEX MICROSCOPIC
Bilirubin Urine: NEGATIVE
Glucose, UA: NEGATIVE mg/dL
Hgb urine dipstick: NEGATIVE
Ketones, ur: NEGATIVE mg/dL
Nitrite: NEGATIVE
Protein, ur: NEGATIVE mg/dL
Specific Gravity, Urine: 1.013 (ref 1.005–1.030)
pH: 5 (ref 5.0–8.0)

## 2023-07-06 LAB — TYPE AND SCREEN
ABO/RH(D): O POS
Antibody Screen: NEGATIVE

## 2023-07-06 LAB — APTT: aPTT: 30 s (ref 24–36)

## 2023-07-06 LAB — GLUCOSE, CAPILLARY: Glucose-Capillary: 99 mg/dL (ref 70–99)

## 2023-07-06 NOTE — Progress Notes (Signed)
Surgical Instructions   Your procedure is scheduled on Wednesday, July 12, 2023. Report to [facility] Main Entrance "A" at 6:30 A.M., then check in with the Admitting office. Any questions or running late day of surgery: call [phone]  Questions prior to your surgery date: call [phone], Monday-Friday, 8am-4pm. If you experience any cold or flu symptoms such as cough, fever, chills, shortness of breath, etc. between now and your scheduled surgery, please notify us at the above number.     Remember:  Do not eat or drink after midnight the night before your surgery   Take these medicines the morning of surgery with A SIP OF WATER  amLODipine (NORVASC)  carvedilol (COREG)    May take these medicines IF NEEDED: acetaminophen (TYLENOL)   One week prior to surgery, STOP taking any Aspirin (unless otherwise instructed by your surgeon) Aleve, Naproxen, Ibuprofen, Motrin, Advil, Goody's, BC's, all herbal medications, fish oil, and non-prescription vitamins.          WHAT DO I DO ABOUT MY DIABETES MEDICATION?   Do not take oral diabetes medicines (pills) the morning of surgery.  DO NOT TAKE YOUR metFORMIN (GLUCOPHAGE-XR) THE DAY OF SURGERY.    HOW TO MANAGE YOUR DIABETES BEFORE AND AFTER SURGERY  Why is it important to control my blood sugar before and after surgery? Improving blood sugar levels before and after surgery helps healing and can limit problems. A way of improving blood sugar control is eating a healthy diet by:  Eating less sugar and carbohydrates  Increasing activity/exercise  Talking with your doctor about reaching your blood sugar goals High blood sugars (greater than 180 mg/dL) can raise your risk of infections and slow your recovery, so you will need to focus on controlling your diabetes during the weeks before surgery. Make sure that the doctor who takes care of your diabetes knows about your planned surgery including the date and location.  How do I  manage my blood sugar before surgery? Check your blood sugar at least 4 times a day, starting 2 days before surgery, to make sure that the level is not too high or low.  Check your blood sugar the morning of your surgery when you wake up and every 2 hours until you get to the Short Stay unit.  If your blood sugar is less than 70 mg/dL, you will need to treat for low blood sugar: Do not take insulin. Treat a low blood sugar (less than 70 mg/dL) with  cup of clear juice (cranberry or apple), 4 glucose tablets, OR glucose gel. Recheck blood sugar in 15 minutes after treatment (to make sure it is greater than 70 mg/dL). If your blood sugar is not greater than 70 mg/dL on recheck, call [phone] for further instructions. Report your blood sugar to the short stay nurse when you get to Short Stay.  If you are admitted to the hospital after surgery: Your blood sugar will be checked by the staff and you will probably be given insulin after surgery (instead of oral diabetes medicines) to make sure you have good blood sugar levels. The goal for blood sugar control after surgery is 80-180 mg/dL.            Do NOT Smoke (Tobacco/Vaping) for 24 hours prior to your procedure.  If you use a CPAP at night, you may bring your mask/headgear for your overnight stay.   You will be asked to remove any contacts, glasses, piercing's, hearing aid's, dentures/partials prior to surgery. Please  bring cases for these items if needed.    Patients discharged the day of surgery will not be allowed to drive home, and someone needs to stay with them for 24 hours.  SURGICAL WAITING ROOM VISITATION Patients may have no more than 2 support people in the waiting area - these visitors may rotate.   Pre-op nurse will coordinate an appropriate time for 1 ADULT support person, who may not rotate, to accompany patient in pre-op.  Children under the age of 61 must have an adult with them who is not the patient and must remain in  the main waiting area with an adult.  If the patient needs to stay at the hospital during part of their recovery, the visitor guidelines for inpatient rooms apply.  Please refer to the [facility] website for the visitor guidelines for any additional information.   If you received a COVID test during your pre-op visit  it is requested that you wear a mask when out in public, stay away from anyone that may not be feeling well and notify your surgeon if you develop symptoms. If you have been in contact with anyone that has tested positive in the last 10 days please notify you surgeon.      Pre-operative CHG Bathing Instructions   You can play a key role in reducing the risk of infection after surgery. Your skin needs to be as free of germs as possible. You can reduce the number of germs on your skin by washing with CHG (chlorhexidine gluconate) soap before surgery. CHG is an antiseptic soap that kills germs and continues to kill germs even after washing.   DO NOT use if you have an allergy to chlorhexidine/CHG or antibacterial soaps. If your skin becomes reddened or irritated, stop using the CHG and notify one of our RNs at [phone].              TAKE A SHOWER THE NIGHT BEFORE SURGERY AND THE DAY OF SURGERY    Please keep in mind the following:  DO NOT shave, including legs and underarms, 48 hours prior to surgery.   You may shave your face before/day of surgery.  Place clean sheets on your bed the night before surgery Use a clean washcloth (not used since being washed) for each shower. DO NOT sleep with pet's night before surgery.  CHG Shower Instructions:  Wash your face and private area with normal soap. If you choose to wash your hair, wash first with your normal shampoo.  After you use shampoo/soap, rinse your hair and body thoroughly to remove shampoo/soap residue.  Turn the water OFF and apply half the bottle of CHG soap to a CLEAN washcloth.  Apply CHG soap ONLY FROM YOUR  NECK DOWN TO YOUR TOES (washing for 3-5 minutes)  DO NOT use CHG soap on face, private areas, open wounds, or sores.  Pay special attention to the area where your surgery is being performed.  If you are having back surgery, having someone wash your back for you may be helpful. Wait 2 minutes after CHG soap is applied, then you may rinse off the CHG soap.  Pat dry with a clean towel  Put on clean pajamas    Additional instructions for the day of surgery: DO NOT APPLY any lotions, deodorants or cologne.    Do not wear jewelry Do not bring valuables to the hospital. [facility] is not responsible for valuables/personal belongings. Put on clean/comfortable clothes.  Please brush your  teeth.  Ask your nurse before applying any prescription medications to the skin.

## 2023-07-06 NOTE — Progress Notes (Signed)
   07/06/23 1836  OBSTRUCTIVE SLEEP APNEA  Have you ever been diagnosed with sleep apnea through a sleep study? No  Do you snore loudly (loud enough to be heard through closed doors)?  0  Do you often feel tired, fatigued, or sleepy during the daytime (such as falling asleep during driving or talking to someone)? 0  Do you have, or are you being treated for high blood pressure? 1  BMI more than 35 kg/m2? 1  Age > 50 (1-yes) 1  Neck circumference greater than:Male 16 inches or larger, Male 17inches or larger? 1  Male Gender (Yes=1) 1  Obstructive Sleep Apnea Score 5

## 2023-07-06 NOTE — Progress Notes (Signed)
PCP - [facility] Department Cardiologist - Karna Dupes  PPM/ICD - denies Device Orders -  Rep Notified -   Chest x-ray - 06/20/23 EKG - 06/18/23 Stress Test - denies ECHO - 12/15/17 Cardiac Cath - denies  Sleep Study - denies CPAP -   Fasting Blood Sugar - pt states he does not have a meter and does not check his blood sugar at home.  Checks Blood Sugar _____ times a day  Last dose of GLP1 agonist-  na GLP1 instructions:   Blood Thinner Instructions:na Aspirin Instructions:na  ERAS Protcol -no PRE-SURGERY Ensure or G2-   COVID TEST- na   Anesthesia review: yes-history of SVT-no cardiac clearance,AAA, hospitalized at [facility] 10/20-10/22/24 for sepsis,acute cystitis with hematuria.  Patient denies shortness of breath, fever, cough and chest pain at PAT appointment   All instructions explained to the patient, with a verbal understanding of the material. Patient agrees to go over the instructions while at home for a better understanding. Patient also instructed to wear a mask when out in public prior to surgery. The opportunity to ask questions was provided.

## 2023-07-07 NOTE — Anesthesia Preprocedure Evaluation (Signed)
Anesthesia Evaluation  Patient identified by MRN, date of birth, ID band Patient awake    Reviewed: Allergy & Precautions, NPO status , Patient's Chart, lab work & pertinent test results, reviewed documented beta blocker date and time   History of Anesthesia Complications Negative for: history of anesthetic complications  Airway Mallampati: II  TM Distance: >3 FB Neck ROM: Full    Dental  (+) Dental Advisory Given   Pulmonary former smoker   Pulmonary exam normal        Cardiovascular hypertension, Pt. on medications and Pt. on home beta blockers + Peripheral Vascular Disease  Normal cardiovascular exam+ dysrhythmias Supra Ventricular Tachycardia    '24 AAA Duplex - Abdominal Aorta: There is evidence of abnormal dilatation of the distal Abdominal aorta. The largest aortic measurement is 5.5 cm. The largest aortic diameter has slightly increased compared to prior exam.   '23 Myoperfusion - Defect 1: There is a small defect with mild reduction in uptake present in the apical apex location(s) that is fixed. There is normal wall motion in the defect area. Consistent with artifact caused by motion and diaphragmatic attenuation. Chamber Size Left ventricular function is normal. Nuclear stress EF: 60 %. End diastolic cavity size is normal. End systolic cavity size is normal. No evidence of transient ischemic dilation (TID) noted. Stress Combined Conclusion Findings are consistent with no prior ischemia. The study is low risk.     Neuro/Psych negative neurological ROS  negative psych ROS   GI/Hepatic negative GI ROS, Neg liver ROS,,,  Endo/Other  diabetes, Type 2, Oral Hypoglycemic Agents   Obesity   Renal/GU Renal InsufficiencyRenal disease     Musculoskeletal negative musculoskeletal ROS (+)    Abdominal   Peds  Hematology negative hematology ROS (+)   Anesthesia Other Findings   Reproductive/Obstetrics                              Anesthesia Physical Anesthesia Plan  ASA: 3  Anesthesia Plan: General   Post-op Pain Management: Tylenol PO (pre-op)*   Induction: Intravenous  PONV Risk Score and Plan: 2 and Treatment may vary due to age or medical condition, Ondansetron, Dexamethasone and Midazolam  Airway Management Planned: Oral ETT  Additional Equipment: Arterial line  Intra-op Plan:   Post-operative Plan: Extubation in OR  Informed Consent: I have reviewed the patients History and Physical, chart, labs and discussed the procedure including the risks, benefits and alternatives for the proposed anesthesia with the patient or authorized representative who has indicated his/her understanding and acceptance.     Dental advisory given and Interpreter used for interveiw  Plan Discussed with: CRNA and Anesthesiologist  Anesthesia Plan Comments: (PAT note written 07/07/2023 by Shonna Chock, PA-C.  )       Anesthesia Quick Evaluation

## 2023-07-07 NOTE — Progress Notes (Signed)
Anesthesia Chart Review:  Case: [id number] Date/Time: 07/12/23 0800   Procedure: ABDOMINAL AORTIC ENDOVASCULAR STENT GRAFT   Anesthesia type: General   Pre-op diagnosis: Infrarenal abdominal aortic aneurysm without rupture   [facility]: [facility] OR ROOM 16 / [facility] OR   Surgeons: Nada Libman, MD       DISCUSSION: Patient is a 67 year old male scheduled for the above procedure.  History includes former smoker, HTN, DM2, SVT (s/p adenosine x2 12/14/17; s/p ablation 02/09/18), AAA (5.5 cm AAA & 2.2 cm LCIA 06/18/23), AKI. OSA screening score was 5.   [facility] admission 06/18/23-06/20/23 with symptoms of body aches, chills, N/V, urinary frequency, and dysuria for 3 days. Respiratory panel was negative for flu and COVID. UA showed large leukocytes, negative nitrites, moderate blood. WBC 17.7-19.9. TMax 101. Lactic acid 3.1. He was mildly tachycardic.  CXR showed mild interstitial opacity consistent with edema or atypical/viral infection, but no focal airspace opacity. Renal CT showed no acute findings but slight increase in known AAA to 5.5 cm. He was admitted for acute cystitis with hematuria and sepsis. He has given IV Ceftriaxone and IVF. Blood and urine cultures ultimately showed no growth.  He was discharged on Keflex and azithromycin.  Presented again on 06/24/23 with neck pain and shoulder pain with some tingling in both hands. 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  Symptoms controlled with lidocaine patch and Toradol and discharged home with diagnosis of cervical strain.  He had vascular surgery follow-up with Dr. Myra Gianotti on 07/03/2023. Given size of AAA, he discussed management options. Patient wanted to proceed with EVAR asap. Notes indicate he will plan for a carotid US while he is in the hospital. Will sent communication to Dr. Myra Gianotti to clarify timing.   Last cardiology follow-up was on  05/30/22 with Jacolyn Reedy, PA-C. He reported chest tightness episodes when walking but not with landscaping later. Nuclear stress test was ordered which was non-ischemic, low risk, EF 60% on 109//23.  07/06/23 PAT labs show minimally elevated AST and ALT in the 40's, normal CBC with WBC 7.1, and UA with only trace leukocytes, rare bacteria. Will forward labs to Dr. Myra Gianotti for review.   ADDENDUM 07/10/23 9:37 AM: Dr. Myra Gianotti responded to my communication sent regarding recent hospitalization for sepsis likely related to acute cystitis, recent lab results, and timing of carotid ultrasound. He would like UA to be repeated on the day of surgery. He is also planning to have Mr. Dyck get a carotid US while he is the hospital for EVAR.     There is no EKG from his 07/06/23 PAT, and 06/18/23 "EKG" does not have a result, so last tracing noted is now > 72 year old and would need on arrival for surgery.    He is Spanish speaking.     VS: BP (!) 165/97   Pulse 74   Temp 36.9 C   Resp 17   Ht 5\' 5"  (1.651 m)   Wt 97.5 kg   SpO2 97%   BMI 35.78 kg/m    PROVIDERS: Health, [facility] Dina Rich, MD is cardiologist Fraser Din, MD is GI. 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 Repeat colonoscopy in 3 years recommended.    LABS: Labs reviewed: Acceptable for surgery. AST, ALT , total bilirubin are mildly elevated, but also noted that specimen was hemolyzed and may affect results. A1c 6.3% 06/18/23. (all labs ordered  are listed, but only abnormal results are displayed)  Labs Reviewed  COMPREHENSIVE METABOLIC PANEL - Abnormal; Notable for the following components:      Result Value   Glucose, Bld 104 (*)    AST 46 (*)    ALT 45 (*)    Total Bilirubin 1.3 (*)    All other components within normal limits  URINALYSIS, ROUTINE W REFLEX MICROSCOPIC - Abnormal; Notable for the following components:   Leukocytes,Ua TRACE (*)    Bacteria, UA  RARE (*)    All other components within normal limits  SURGICAL PCR SCREEN  GLUCOSE, CAPILLARY  CBC  PROTIME-INR  APTT  TYPE AND SCREEN    IMAGES: CT C-spine 06/24/23: IMPRESSION: 1. Straightening of the cervical spine with multilevel degenerative change. No acute osseous abnormality. 2. 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  CXR 06/20/23: IMPRESSION: Mild diffuse interstitial opacity, consistent with edema or atypical/viral infection. No focal airspace opacity.  CT Renal Stone 06/18/23: IMPRESSION: 1. No acute findings in the abdomen or pelvis. No nephroureterolithiasis or hydronephrosis. 2. 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 Recommend CTA or MRA, as appropriate, in 6 months and referral to a vascular specialist. Reference: Journal of Vascular Surgery 67.1 (2018): 2-77. J Am Coll Radiol 2013;10:789-794. 3. 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 4. Multiple other nonacute observations, as described above   EKG: Even though EKG was ordered for 06/18/23, I do not see a copy of a tracing in [facility] or [facility], so will need to get on the day of surgery if last tracing is > 1 year ago.    EKG on 05/30/22 showed NSR, anteroseptal infarct, age undetermined.   CV: [place] AAA 07/03/23: Summary:  Abdominal Aorta: There is evidence of abnormal dilatation of the distal  Abdominal aorta. The largest aortic measurement is 5.5 cm. The largest  aortic diameter has slightly increased compared to prior exam. Previous  diameter measurement was 5.4 cm  obtained on 06/23/2022.    Nuclear stress test 06/06/22:   Normal blood pressure and normal heart rate response noted during stress. Heart rate recovery was normal.   The ECG was not diagnostic due to pharmacologic protocol.   LV perfusion is abnormal. There is no evidence of ischemia.  There is no evidence of infarction. Defect 1: There is a small defect with mild reduction in uptake present in the apical apex location(s) that is fixed. There is normal wall motion in the defect area. Consistent with artifact caused by motion and diaphragmatic attenuation.   Left ventricular function is normal. Nuclear stress EF: 60 %. End diastolic cavity size is normal. End systolic cavity size is normal.   Findings are consistent with no  ischemia. The study is low risk.    Echo 12/15/17: Study Conclusions  - Left ventricle: The cavity size was normal. Wall thickness was    increased in a pattern of moderate to severe LVH. Systolic    function was normal. The estimated ejection fraction was in the    range of 60% to 65%. Wall motion was normal; there were no    regional wall motion abnormalities. Left ventricular diastolic    function parameters were normal.  - Aortic valve: Valve area (VTI): 2.64 cm^2. Valve area (Vmax):    2.51 cm^2. Valve area (Vmean): 2.43 cm^2.  - Technically adequate study.  Past Medical History:  Diagnosis Date   AAA (abdominal aortic aneurysm) (HCC)    AKI (acute kidney injury) (HCC) 12/14/2017   Chronic neck pain    Diabetes mellitus without complication (HCC)    History of kidney stones    HTN (hypertension) 12/14/2017   SVT (supraventricular tachycardia) (HCC) 12/14/2017    Past Surgical History:  Procedure Laterality Date   ABDOMINAL SURGERY     SHOULDER SURGERY Left    SHOULDER SURGERY     SVT ABLATION N/A 02/09/2018   Procedure: SVT ABLATION;  Surgeon: Marinus Maw, MD;  Location: [facility] INVASIVE CV LAB;  Service: Cardiovascular;  Laterality: N/A;    MEDICATIONS:  acetaminophen (TYLENOL) 325 MG tablet   amLODipine (NORVASC) 10 MG tablet   carvedilol (COREG) 3.125 MG tablet   furosemide (LASIX) 20 MG tablet   lidocaine (SALONPAS PAIN RELIEVING) 4 %   losartan (COZAAR) 25 MG tablet   metFORMIN (GLUCOPHAGE-XR) 500 MG 24 hr tablet   naproxen  (NAPROSYN) 375 MG tablet   Potassium Chloride ER 20 MEQ TBCR   No current facility-administered medications for this encounter.   Shonna Chock, PA-C Surgical Short Stay/Anesthesiology [facility] Phone [phone] [facility] Phone [phone] 07/07/2023 5:16 PM

## 2023-07-12 ENCOUNTER — Inpatient Hospital Stay (HOSPITAL_COMMUNITY): Payer: 59

## 2023-07-12 ENCOUNTER — Encounter (HOSPITAL_COMMUNITY)
Admission: RE | Disposition: A | Discharge status: Home / Self Care | Payer: Self-pay | Source: Home / Self Care | Attending: Surgery

## 2023-07-12 ENCOUNTER — Inpatient Hospital Stay (HOSPITAL_COMMUNITY): Payer: 59 | Admitting: Anesthesiology

## 2023-07-12 ENCOUNTER — Inpatient Hospital Stay (HOSPITAL_COMMUNITY)
Admission: RE | Admit: 2023-07-12 | Discharge: 2023-07-13 | DRG: 269 | Disposition: A | Discharge status: Home / Self Care | Payer: 59 | Attending: Surgery | Admitting: Surgery

## 2023-07-12 ENCOUNTER — Other Ambulatory Visit: Payer: Self-pay

## 2023-07-12 ENCOUNTER — Inpatient Hospital Stay (HOSPITAL_COMMUNITY): Payer: 59 | Admitting: Vascular Surgery

## 2023-07-12 ENCOUNTER — Encounter (HOSPITAL_COMMUNITY): Payer: Self-pay | Admitting: Surgery

## 2023-07-12 DIAGNOSIS — I714 Abdominal aortic aneurysm, without rupture, unspecified: Secondary | ICD-10-CM | POA: Diagnosis present

## 2023-07-12 DIAGNOSIS — I472 Ventricular tachycardia, unspecified: Secondary | ICD-10-CM | POA: Diagnosis present

## 2023-07-12 DIAGNOSIS — Z87891 Personal history of nicotine dependence: Secondary | ICD-10-CM | POA: Diagnosis not present

## 2023-07-12 DIAGNOSIS — Z6835 Body mass index (BMI) 35.0-35.9, adult: Secondary | ICD-10-CM | POA: Diagnosis not present

## 2023-07-12 DIAGNOSIS — Z87442 Personal history of urinary calculi: Secondary | ICD-10-CM | POA: Diagnosis not present

## 2023-07-12 DIAGNOSIS — N390 Urinary tract infection, site not specified: Secondary | ICD-10-CM | POA: Diagnosis present

## 2023-07-12 DIAGNOSIS — I7143 Infrarenal abdominal aortic aneurysm, without rupture: Secondary | ICD-10-CM

## 2023-07-12 DIAGNOSIS — Z791 Long term (current) use of non-steroidal anti-inflammatories (NSAID): Secondary | ICD-10-CM | POA: Diagnosis not present

## 2023-07-12 DIAGNOSIS — M542 Cervicalgia: Secondary | ICD-10-CM | POA: Diagnosis present

## 2023-07-12 DIAGNOSIS — E119 Type 2 diabetes mellitus without complications: Secondary | ICD-10-CM | POA: Diagnosis present

## 2023-07-12 DIAGNOSIS — E669 Obesity, unspecified: Secondary | ICD-10-CM | POA: Diagnosis present

## 2023-07-12 DIAGNOSIS — Z01818 Encounter for other preprocedural examination: Secondary | ICD-10-CM

## 2023-07-12 DIAGNOSIS — I739 Peripheral vascular disease, unspecified: Secondary | ICD-10-CM | POA: Diagnosis present

## 2023-07-12 DIAGNOSIS — Z7984 Long term (current) use of oral hypoglycemic drugs: Secondary | ICD-10-CM

## 2023-07-12 DIAGNOSIS — R82998 Other abnormal findings in urine: Secondary | ICD-10-CM

## 2023-07-12 DIAGNOSIS — G8929 Other chronic pain: Secondary | ICD-10-CM | POA: Diagnosis present

## 2023-07-12 DIAGNOSIS — Z79899 Other long term (current) drug therapy: Secondary | ICD-10-CM | POA: Diagnosis not present

## 2023-07-12 DIAGNOSIS — Z8249 Family history of ischemic heart disease and other diseases of the circulatory system: Secondary | ICD-10-CM | POA: Diagnosis not present

## 2023-07-12 DIAGNOSIS — I10 Essential (primary) hypertension: Secondary | ICD-10-CM | POA: Diagnosis present

## 2023-07-12 HISTORY — PX: ABDOMINAL AORTIC ENDOVASCULAR STENT GRAFT: SHX5707

## 2023-07-12 LAB — GLUCOSE, CAPILLARY
Glucose-Capillary: 131 mg/dL — ABNORMAL HIGH (ref 70–99)
Glucose-Capillary: 156 mg/dL — ABNORMAL HIGH (ref 70–99)
Glucose-Capillary: 146 mg/dL — ABNORMAL HIGH (ref 70–99)
Glucose-Capillary: 220 mg/dL — ABNORMAL HIGH (ref 70–99)

## 2023-07-12 LAB — URINALYSIS, ROUTINE W REFLEX MICROSCOPIC
Bilirubin Urine: NEGATIVE
Glucose, UA: NEGATIVE mg/dL
Hgb urine dipstick: NEGATIVE
Ketones, ur: NEGATIVE mg/dL
Nitrite: NEGATIVE
Protein, ur: NEGATIVE mg/dL
Specific Gravity, Urine: 1.012 (ref 1.005–1.030)
pH: 6 (ref 5.0–8.0)

## 2023-07-12 LAB — CBC
HCT: 43.8 % (ref 39.0–52.0)
Hemoglobin: 14.8 g/dL (ref 13.0–17.0)
MCH: 31.8 pg (ref 26.0–34.0)
MCHC: 33.8 g/dL (ref 30.0–36.0)
MCV: 94.2 fL (ref 80.0–100.0)
Platelets: 173 10*3/uL (ref 150–400)
RBC: 4.65 MIL/uL (ref 4.22–5.81)
RDW: 14.1 % (ref 11.5–15.5)
WBC: 6.7 10*3/uL (ref 4.0–10.5)
nRBC: 0 % (ref 0.0–0.2)

## 2023-07-12 LAB — PROTIME-INR
INR: 1.2 (ref 0.8–1.2)
Prothrombin Time: 15.4 s — ABNORMAL HIGH (ref 11.4–15.2)

## 2023-07-12 LAB — BASIC METABOLIC PANEL
Anion gap: 9 (ref 5–15)
BUN: 9 mg/dL (ref 8–23)
CO2: 23 mmol/L (ref 22–32)
Calcium: 8.9 mg/dL (ref 8.9–10.3)
Chloride: 102 mmol/L (ref 98–111)
Creatinine, Ser: 0.71 mg/dL (ref 0.61–1.24)
GFR, Estimated: >60 mL/min (ref >60)
Glucose, Bld: 159 mg/dL — ABNORMAL HIGH (ref 70–99)
Potassium: 3.6 mmol/L (ref 3.5–5.1)
Sodium: 134 mmol/L — ABNORMAL LOW (ref 135–145)

## 2023-07-12 LAB — APTT: aPTT: 31 s (ref 24–36)

## 2023-07-12 LAB — MAGNESIUM: Magnesium: 1.7 mg/dL (ref 1.7–2.4)

## 2023-07-12 LAB — ABO/RH: ABO/RH(D): O POS

## 2023-07-12 SURGERY — INSERTION, ENDOVASCULAR STENT GRAFT, AORTA, ABDOMINAL
Anesthesia: General

## 2023-07-12 MED ORDER — GUAIFENESIN-DM 100-10 MG/5ML PO SYRP: 15.0000 mL | ORAL_SOLUTION | ORAL | Status: DC | PRN

## 2023-07-12 MED ORDER — PROTAMINE SULFATE 10 MG/ML IV SOLN
INTRAVENOUS | Status: DC | PRN
  Administered 2023-07-12: 50 mg via INTRAVENOUS

## 2023-07-12 MED ORDER — SENNOSIDES-DOCUSATE SODIUM 8.6-50 MG PO TABS: 1.0000 | ORAL_TABLET | Freq: Every evening | ORAL | Status: DC | PRN

## 2023-07-12 MED ORDER — PHENYLEPHRINE HCL-NACL 20-0.9 MG/250ML-% IV SOLN
INTRAVENOUS | Status: DC | PRN
  Administered 2023-07-12: 50 ug/min via INTRAVENOUS

## 2023-07-12 MED ORDER — PROPOFOL 10 MG/ML IV BOLUS
INTRAVENOUS | Status: DC | PRN
  Administered 2023-07-12: 200 mg via INTRAVENOUS
  Administered 2023-07-12: 100 mg via INTRAVENOUS

## 2023-07-12 MED ORDER — SODIUM CHLORIDE 0.9 % IV SOLN: INTRAVENOUS | Status: DC

## 2023-07-12 MED ORDER — PROPOFOL 10 MG/ML IV BOLUS
INTRAVENOUS | Status: AC
  Filled 2023-07-12: qty 20

## 2023-07-12 MED ORDER — FENTANYL CITRATE (PF) 100 MCG/2ML IJ SOLN
25.0000 ug | INTRAMUSCULAR | Status: DC | PRN
  Administered 2023-07-12 (×2): 50 ug via INTRAVENOUS

## 2023-07-12 MED ORDER — CEFAZOLIN SODIUM-DEXTROSE 2-4 GM/100ML-% IV SOLN
INTRAVENOUS | Status: AC
  Filled 2023-07-12: qty 100

## 2023-07-12 MED ORDER — CHLORHEXIDINE GLUCONATE 0.12 % MT SOLN
OROMUCOSAL | Status: AC
  Administered 2023-07-12: 15 mL via OROMUCOSAL
  Filled 2023-07-12: qty 15

## 2023-07-12 MED ORDER — OXYCODONE HCL 5 MG PO TABS
5.0000 mg | ORAL_TABLET | Freq: Once | ORAL | Status: DC | PRN
Start: 2023-07-12 — End: 2023-07-12

## 2023-07-12 MED ORDER — HEPARIN SODIUM (PORCINE) 1000 UNIT/ML IJ SOLN
INTRAMUSCULAR | Status: DC | PRN
  Administered 2023-07-12: 10000 [IU] via INTRAVENOUS

## 2023-07-12 MED ORDER — ONDANSETRON HCL 4 MG/2ML IJ SOLN: 4.0000 mg | Freq: Once | INTRAMUSCULAR | Status: DC | PRN

## 2023-07-12 MED ORDER — FENTANYL CITRATE (PF) 250 MCG/5ML IJ SOLN
INTRAMUSCULAR | Status: DC | PRN
  Administered 2023-07-12: 100 ug via INTRAVENOUS

## 2023-07-12 MED ORDER — ATORVASTATIN CALCIUM 10 MG PO TABS
10.0000 mg | ORAL_TABLET | Freq: Every day | ORAL | Status: DC
  Administered 2023-07-12 – 2023-07-13 (×2): 10 mg via ORAL
  Filled 2023-07-12 (×2): qty 1

## 2023-07-12 MED ORDER — HEPARIN 6000 UNIT IRRIGATION SOLUTION
Status: AC
  Filled 2023-07-12: qty 500

## 2023-07-12 MED ORDER — CHLORHEXIDINE GLUCONATE 0.12 % MT SOLN: 15.0000 mL | Freq: Once | OROMUCOSAL | Status: AC

## 2023-07-12 MED ORDER — DEXAMETHASONE SODIUM PHOSPHATE 10 MG/ML IJ SOLN
INTRAMUSCULAR | Status: AC
  Filled 2023-07-12: qty 1

## 2023-07-12 MED ORDER — LACTATED RINGERS IV SOLN: INTRAVENOUS | Status: DC

## 2023-07-12 MED ORDER — ROCURONIUM BROMIDE 10 MG/ML (PF) SYRINGE
PREFILLED_SYRINGE | INTRAVENOUS | Status: AC
  Filled 2023-07-12: qty 10

## 2023-07-12 MED ORDER — AMLODIPINE BESYLATE 10 MG PO TABS
10.0000 mg | ORAL_TABLET | Freq: Every day | ORAL | Status: DC
  Administered 2023-07-12 – 2023-07-13 (×2): 10 mg via ORAL
  Filled 2023-07-12 (×2): qty 1

## 2023-07-12 MED ORDER — HEPARIN SODIUM (PORCINE) 5000 UNIT/ML IJ SOLN
5000.0000 [IU] | Freq: Three times a day (TID) | INTRAMUSCULAR | Status: DC
  Administered 2023-07-13: 5000 [IU] via SUBCUTANEOUS
  Filled 2023-07-12: qty 1

## 2023-07-12 MED ORDER — FENTANYL CITRATE (PF) 100 MCG/2ML IJ SOLN
INTRAMUSCULAR | Status: AC
  Filled 2023-07-12: qty 2

## 2023-07-12 MED ORDER — METOPROLOL TARTRATE 5 MG/5ML IV SOLN: 2.0000 mg | INTRAVENOUS | Status: DC | PRN

## 2023-07-12 MED ORDER — HEPARIN SODIUM (PORCINE) 1000 UNIT/ML IJ SOLN
INTRAMUSCULAR | Status: AC
  Filled 2023-07-12: qty 10

## 2023-07-12 MED ORDER — LIDOCAINE 2% (20 MG/ML) 5 ML SYRINGE
INTRAMUSCULAR | Status: AC
Start: 2023-07-12 — End: ?
  Filled 2023-07-12: qty 5

## 2023-07-12 MED ORDER — DOCUSATE SODIUM 100 MG PO CAPS
100.0000 mg | ORAL_CAPSULE | Freq: Every day | ORAL | Status: DC
  Administered 2023-07-13: 100 mg via ORAL
  Filled 2023-07-12: qty 1

## 2023-07-12 MED ORDER — ORAL CARE MOUTH RINSE: 15.0000 mL | Freq: Once | OROMUCOSAL | Status: AC

## 2023-07-12 MED ORDER — IODIXANOL 320 MG/ML IV SOLN
INTRAVENOUS | Status: DC | PRN
  Administered 2023-07-12: 46 mL via INTRA_ARTERIAL

## 2023-07-12 MED ORDER — MIDAZOLAM HCL 2 MG/2ML IJ SOLN
INTRAMUSCULAR | Status: DC | PRN
  Administered 2023-07-12 (×2): 1 mg via INTRAVENOUS

## 2023-07-12 MED ORDER — ONDANSETRON HCL 4 MG/2ML IJ SOLN
INTRAMUSCULAR | Status: AC
Start: 2023-07-12 — End: ?
  Filled 2023-07-12: qty 2

## 2023-07-12 MED ORDER — ALUM & MAG HYDROXIDE-SIMETH 200-200-20 MG/5ML PO SUSP: 15.0000 mL | ORAL | Status: DC | PRN

## 2023-07-12 MED ORDER — HYDRALAZINE HCL 20 MG/ML IJ SOLN
5.0000 mg | INTRAMUSCULAR | Status: DC | PRN
  Administered 2023-07-12: 5 mg via INTRAVENOUS
  Filled 2023-07-12: qty 1

## 2023-07-12 MED ORDER — LABETALOL HCL 5 MG/ML IV SOLN: 10.0000 mg | INTRAVENOUS | Status: DC | PRN

## 2023-07-12 MED ORDER — LIDOCAINE 2% (20 MG/ML) 5 ML SYRINGE
INTRAMUSCULAR | Status: DC | PRN
  Administered 2023-07-12: 60 mg via INTRAVENOUS

## 2023-07-12 MED ORDER — SODIUM CHLORIDE 0.9 % IV SOLN: 500.0000 mL | Freq: Once | INTRAVENOUS | Status: DC | PRN

## 2023-07-12 MED ORDER — ACETAMINOPHEN 500 MG PO TABS
1000.0000 mg | ORAL_TABLET | Freq: Once | ORAL | Status: AC
  Administered 2023-07-12: 1000 mg via ORAL
  Filled 2023-07-12: qty 2

## 2023-07-12 MED ORDER — ROCURONIUM BROMIDE 10 MG/ML (PF) SYRINGE
PREFILLED_SYRINGE | INTRAVENOUS | Status: DC | PRN
  Administered 2023-07-12: 40 mg via INTRAVENOUS
  Administered 2023-07-12: 60 mg via INTRAVENOUS

## 2023-07-12 MED ORDER — PANTOPRAZOLE SODIUM 40 MG PO TBEC
40.0000 mg | DELAYED_RELEASE_TABLET | Freq: Every day | ORAL | Status: DC
  Administered 2023-07-12 – 2023-07-13 (×2): 40 mg via ORAL
  Filled 2023-07-12 (×2): qty 1

## 2023-07-12 MED ORDER — CHLORHEXIDINE GLUCONATE CLOTH 2 % EX PADS
6.0000 | MEDICATED_PAD | Freq: Once | CUTANEOUS | Status: DC
  Administered 2023-07-12: 6 via TOPICAL

## 2023-07-12 MED ORDER — POTASSIUM CHLORIDE CRYS ER 20 MEQ PO TBCR: 20.0000 meq | EXTENDED_RELEASE_TABLET | Freq: Every day | ORAL | Status: DC | PRN

## 2023-07-12 MED ORDER — 0.9 % SODIUM CHLORIDE (POUR BTL) OPTIME
TOPICAL | Status: DC | PRN
  Administered 2023-07-12: 1000 mL

## 2023-07-12 MED ORDER — ACETAMINOPHEN 325 MG PO TABS: 650.0000 mg | ORAL_TABLET | Freq: Four times a day (QID) | ORAL | Status: DC | PRN

## 2023-07-12 MED ORDER — LOSARTAN POTASSIUM 25 MG PO TABS
25.0000 mg | ORAL_TABLET | Freq: Every day | ORAL | Status: DC
  Administered 2023-07-12 – 2023-07-13 (×2): 25 mg via ORAL
  Filled 2023-07-12 (×2): qty 1

## 2023-07-12 MED ORDER — OXYCODONE HCL 5 MG/5ML PO SOLN: 5.0000 mg | Freq: Once | ORAL | Status: DC | PRN

## 2023-07-12 MED ORDER — CEFAZOLIN SODIUM-DEXTROSE 2-4 GM/100ML-% IV SOLN
2.0000 g | Freq: Three times a day (TID) | INTRAVENOUS | Status: AC
  Administered 2023-07-12 – 2023-07-13 (×2): 2 g via INTRAVENOUS
  Filled 2023-07-12 (×2): qty 100

## 2023-07-12 MED ORDER — CARVEDILOL 3.125 MG PO TABS
3.1250 mg | ORAL_TABLET | Freq: Two times a day (BID) | ORAL | Status: DC
  Administered 2023-07-12 – 2023-07-13 (×2): 3.125 mg via ORAL
  Filled 2023-07-12 (×2): qty 1

## 2023-07-12 MED ORDER — PHENOL 1.4 % MT LIQD: 1.0000 | OROMUCOSAL | Status: DC | PRN

## 2023-07-12 MED ORDER — OXYCODONE HCL 5 MG PO TABS
5.0000 mg | ORAL_TABLET | ORAL | Status: DC | PRN
  Administered 2023-07-13: 10 mg via ORAL
  Filled 2023-07-12: qty 2

## 2023-07-12 MED ORDER — PROTAMINE SULFATE 10 MG/ML IV SOLN
INTRAVENOUS | Status: AC
  Filled 2023-07-12: qty 5

## 2023-07-12 MED ORDER — INSULIN ASPART 100 UNIT/ML IJ SOLN
0.0000 [IU] | Freq: Three times a day (TID) | INTRAMUSCULAR | Status: DC
  Administered 2023-07-12 – 2023-07-13 (×2): 2 [IU] via SUBCUTANEOUS

## 2023-07-12 MED ORDER — MAGNESIUM SULFATE 2 GM/50ML IV SOLN: 2.0000 g | Freq: Every day | INTRAVENOUS | Status: DC | PRN

## 2023-07-12 MED ORDER — DEXAMETHASONE SODIUM PHOSPHATE 10 MG/ML IJ SOLN
INTRAMUSCULAR | Status: DC | PRN
  Administered 2023-07-12: 5 mg via INTRAVENOUS

## 2023-07-12 MED ORDER — BISACODYL 5 MG PO TBEC: 5.0000 mg | DELAYED_RELEASE_TABLET | Freq: Every day | ORAL | Status: DC | PRN

## 2023-07-12 MED ORDER — HYDROMORPHONE HCL 1 MG/ML IJ SOLN: 0.5000 mg | INTRAMUSCULAR | Status: DC | PRN

## 2023-07-12 MED ORDER — ACETAMINOPHEN 650 MG RE SUPP: 650.0000 mg | RECTAL | Status: DC | PRN

## 2023-07-12 MED ORDER — ASPIRIN 81 MG PO TBEC
81.0000 mg | DELAYED_RELEASE_TABLET | Freq: Every day | ORAL | Status: DC
  Administered 2023-07-13: 81 mg via ORAL
  Filled 2023-07-12: qty 1

## 2023-07-12 MED ORDER — HEPARIN 6000 UNIT IRRIGATION SOLUTION
Status: DC | PRN
  Administered 2023-07-12: 1

## 2023-07-12 MED ORDER — CEFAZOLIN SODIUM-DEXTROSE 2-4 GM/100ML-% IV SOLN
2.0000 g | INTRAVENOUS | Status: AC
  Administered 2023-07-12: 2 g via INTRAVENOUS

## 2023-07-12 MED ORDER — FENTANYL CITRATE (PF) 250 MCG/5ML IJ SOLN
INTRAMUSCULAR | Status: AC
  Filled 2023-07-12: qty 5

## 2023-07-12 MED ORDER — SUGAMMADEX SODIUM 200 MG/2ML IV SOLN
INTRAVENOUS | Status: DC | PRN
  Administered 2023-07-12: 200 mg via INTRAVENOUS

## 2023-07-12 MED ORDER — PHENYLEPHRINE 80 MCG/ML (10ML) SYRINGE FOR IV PUSH (FOR BLOOD PRESSURE SUPPORT)
PREFILLED_SYRINGE | INTRAVENOUS | Status: DC | PRN
  Administered 2023-07-12: 160 ug via INTRAVENOUS

## 2023-07-12 MED ORDER — ONDANSETRON HCL 4 MG/2ML IJ SOLN
4.0000 mg | Freq: Four times a day (QID) | INTRAMUSCULAR | Status: DC | PRN
Start: 2023-07-12 — End: 2023-07-13

## 2023-07-12 MED ORDER — MIDAZOLAM HCL 2 MG/2ML IJ SOLN
INTRAMUSCULAR | Status: AC
  Filled 2023-07-12: qty 2

## 2023-07-12 MED ORDER — METOPROLOL TARTRATE 5 MG/5ML IV SOLN
INTRAVENOUS | Status: DC | PRN
  Administered 2023-07-12 (×2): 2.5 mg via INTRAVENOUS

## 2023-07-12 SURGICAL SUPPLY — 50 items
BAG COUNTER SPONGE SURGICOUNT (BAG) ×1 IMPLANT
BLADE CLIPPER SURG (BLADE) ×1 IMPLANT
CANISTER SUCT 3000ML PPV (MISCELLANEOUS) ×1 IMPLANT
CATH BEACON 5.038 65CM KMP-01 (CATHETERS) ×1 IMPLANT
CATH OMNI FLUSH .035X70CM (CATHETERS) ×1 IMPLANT
COVER BACK TABLE 80X110 HD (DRAPES) IMPLANT
DERMABOND ADVANCED .7 DNX12 (GAUZE/BANDAGES/DRESSINGS) ×1 IMPLANT
DEVICE CLOSURE PERCLS PRGLD 6F (VASCULAR PRODUCTS) ×4 IMPLANT
DEVICE TORQUE H2O (MISCELLANEOUS) IMPLANT
DRSG TEGADERM 2-3/8X2-3/4 SM (GAUZE/BANDAGES/DRESSINGS) ×2 IMPLANT
ELECT CAUTERY BLADE 6.4 (BLADE) ×1 IMPLANT
ELECT REM PT RETURN 9FT ADLT (ELECTROSURGICAL) ×1
ELECTRODE REM PT RTRN 9FT ADLT (ELECTROSURGICAL) ×2 IMPLANT
EXCLUDER TNK LEG 23MX14X18 (Endovascular Graft) IMPLANT
EXCLUDER TRUNK LEG 23MX14X18 (Endovascular Graft) ×1 IMPLANT
GAUZE SPONGE 2X2 8PLY STRL LF (GAUZE/BANDAGES/DRESSINGS) ×2 IMPLANT
GLOVE SURG SS PI 7.5 STRL IVOR (GLOVE) ×3 IMPLANT
GOWN STRL REUS W/ TWL LRG LVL3 (GOWN DISPOSABLE) ×2 IMPLANT
GOWN STRL REUS W/ TWL XL LVL3 (GOWN DISPOSABLE) ×2 IMPLANT
GOWN STRL REUS W/TWL LRG LVL3 (GOWN DISPOSABLE) ×2
GOWN STRL REUS W/TWL XL LVL3 (GOWN DISPOSABLE) ×2
GRAFT BALLN CATH 65CM (BALLOONS) ×1 IMPLANT
GUIDEWIRE ANGLED .035X150CM (WIRE) IMPLANT
KIT BASIN OR (CUSTOM PROCEDURE TRAY) ×1 IMPLANT
KIT DRAIN CSF ACCUDRAIN (MISCELLANEOUS) IMPLANT
KIT TURNOVER KIT B (KITS) ×1 IMPLANT
LEG CONTRALATERAL 16X14.5X14 (Vascular Products) IMPLANT
NS IRRIG 1000ML POUR BTL (IV SOLUTION) ×1 IMPLANT
PACK ENDOVASCULAR (PACKS) ×1 IMPLANT
PAD ARMBOARD 7.5X6 YLW CONV (MISCELLANEOUS) ×2 IMPLANT
PENCIL BUTTON HOLSTER BLD 10FT (ELECTRODE) ×1 IMPLANT
PERCLOSE PROGLIDE 6F (VASCULAR PRODUCTS) ×4
SET MICROPUNCTURE 5F STIFF (MISCELLANEOUS) ×1 IMPLANT
SHEATH BRITE TIP 8FR 23CM (SHEATH) ×1 IMPLANT
SHEATH DRYSEAL FLEX 12FR 33CM (SHEATH) IMPLANT
SHEATH DRYSEAL FLEX 16FR 33CM (SHEATH) IMPLANT
SHEATH PINNACLE 8F 10CM (SHEATH) ×1 IMPLANT
STOPCOCK MORSE 400PSI 3WAY (MISCELLANEOUS) ×1 IMPLANT
SUT PROLENE 5 0 C 1 24 (SUTURE) IMPLANT
SUT VIC AB 2-0 CT1 27 (SUTURE)
SUT VIC AB 2-0 CT1 TAPERPNT 27 (SUTURE) IMPLANT
SUT VIC AB 3-0 SH 27 (SUTURE)
SUT VIC AB 3-0 SH 27X BRD (SUTURE) IMPLANT
SUT VICRYL 4-0 PS2 18IN ABS (SUTURE) IMPLANT
SYR 20ML LL LF (SYRINGE) ×1 IMPLANT
TOWEL GREEN STERILE (TOWEL DISPOSABLE) ×1 IMPLANT
TRAY FOLEY MTR SLVR 16FR STAT (SET/KITS/TRAYS/PACK) ×1 IMPLANT
TUBING HIGH PRESSURE 120CM (CONNECTOR) ×1 IMPLANT
WIRE AMPLATZ SS-J .035X180CM (WIRE) ×2 IMPLANT
WIRE BENTSON .035X145CM (WIRE) ×2 IMPLANT

## 2023-07-12 NOTE — Transfer of Care (Signed)
Immediate Anesthesia Transfer of Care Note  Patient: Joakim Morelock  Procedure(s) Performed: ABDOMINAL AORTIC ENDOVASCULAR STENT GRAFT  Patient Location: PACU  Anesthesia Type:General  Level of Consciousness: awake, alert , and oriented  Airway & Oxygen Therapy: Patient Spontanous Breathing and Patient connected to face mask oxygen  Post-op Assessment: Report given to RN and Post -op Vital signs reviewed and stable  Post vital signs: Reviewed and stable  Last Vitals:  Vitals Value Taken Time  BP 107/69 07/12/23 1009  Temp    Pulse 62 07/12/23 1010  Resp 17 07/12/23 1010  SpO2 97 % 07/12/23 1010  Vitals shown include unfiled device data.  Last Pain:  Vitals:   07/12/23 0709  TempSrc:   PainSc: 3       Patients Stated Pain Goal: 1 (07/12/23 0709)  Complications: No notable events documented.

## 2023-07-12 NOTE — Interval H&P Note (Signed)
History and Physical Interval Note:  07/12/2023 8:26 AM  Jason Sweeney  has presented today for surgery, with the diagnosis of Infrarenal abdominal aortic aneurysm without rupture.  The various methods of treatment have been discussed with the patient and family. After consideration of risks, benefits and other options for treatment, the patient has consented to  Procedure(s): ABDOMINAL AORTIC ENDOVASCULAR STENT GRAFT (N/A) as a surgical intervention.  The patient's history has been reviewed, patient examined, no change in status, stable for surgery.  I have reviewed the patient's chart and labs.  Questions were answered to the patient's satisfaction.     Durene Cal

## 2023-07-12 NOTE — Op Note (Signed)
Patient name: Jason Sweeney MRN: [id number] DOB: 1955-12-31 Sex: male  07/12/2023 Pre-operative Diagnosis: 5.5 cm abdominal aortic aneurysm Post-operative diagnosis:  Same Surgeon:  Durene Cal Assistants:  Carolynn Sayers, MD Procedure:   #1: Endovascular repair of abdominal aortic aneurysm, aortobiiliac endograft (40981)   #2: Bilateral ultrasound-guided access (19147) Anesthesia:  General Blood Loss:  minimal Specimens:  none  Findings: Complete exclusion  Devices used: Main body was primary left Gore 23 x 14 x 18.  Contralateral right was a Gore 14 x 14  Indications: This is a 67 year old gentleman with a 5.5 cm infrarenal abdominal aortic aneurysm.  He is asymptomatic.  He comes in today for endovascular repair.  Procedure:  The patient was identified in the holding area and taken to [facility] OR ROOM 16  The patient was then placed supine on the table. general anesthesia was administered.  The patient was prepped and draped in the usual sterile fashion.  A time out was called and antibiotics were administered.  Due to the complexity of the case, I experienced assistant was necessary.  He helped with cannulation of the left groin, catheter exchanges, device deployment, and closure.  Ultrasound was used to evaluate bilateral common femoral arteries which were minimally calcified and pulsatile.  A #11 blade was used to make a skin nick.  Bilateral common femoral arteries were cannulated under ultrasound guidance with a micropuncture needle.  A 018 wire was inserted without resistance followed by placement of a micropuncture sheath.  Next a Bentson wire was inserted and the subcutaneous tract was dilated with an 8 [place] dilator.  Pro-glide devices were deployed at the 11:00 and 1 o'clock position.  8 French sheaths were placed bilaterally.  Over Amplatz Super Stiff wires, a 16 French sheath was placed up the left side and a 12 French sheath placed up the right.  The patient was fully  heparinized.  The main body device was prepared on the back table.  This was a Gore 23 x 14 x 18 device.  It was placed into the aorta up the left side.  A pigtail catheter was advanced up the right side and positioned at L1.  An abdominal aortogram was performed locating the renal arteries.  The device was then deployed landing at the lowest left renal artery.  Next, the Omni Flush catheter was removed over an Amplatz wire.  A second access in the 12 French dry seal sheath was obtained.  The gate was cannulated with a Berenstein catheter and a Bentson wire.  A pigtail catheter was then inserted, confirming cannulation of the gate as it was able to be rotated freely within the main body.  The Amplatz Super Stiff wire was removed and then inserted through the Omni Flush catheter.  The image detector was rotated to a LAO position and a retrograde injection was performed through the sheath in the right groin locating the left hypogastric artery.  A right sided extension was selected.  This was a 14 x 14 Gore device.  It was inserted with the appropriate overlap, and deployed landing at the level of the right hypogastric artery.  Next, a retrograde injection was performed through the sheath in the left groin with the image detector in a RAO obliquity.  This located the left hypogastric artery.  The remaining portion of the ipsilateral side was deployed landing just short of the hypogastric artery.  There was slight ectasia at the distal common iliac artery which was not to be treated today.  Next, a M OB balloon was used to mold the proximal and distal attachment sites as well as device overlap.  Completion angiogram was then performed which showed a widely patent bilateral renal arteries, bilateral common, internal, and external iliac arteries with complete exclusion of the aneurysm.  The stiff wires were then exchanged out for Bentson wires and the cannulation sites were closed by securing the previously placed  ProGlide devices.  We checked that the patient had Doppler signals in his foot.  The patient's heparin was reversed with 50 mg of protamine.  After this is circulated, the subcutaneous tissue was cauterized with the Bovie and Dermabond was placed.  He was successfully extubated and taken recovery in stable condition.  There were no immediate complications.   Disposition: To PACU stable.   Juleen China, M.D., [facility] [place] Office: [phone] Pager:  [phone]

## 2023-07-12 NOTE — Anesthesia Procedure Notes (Signed)
Procedure Name: Intubation Date/Time: 07/12/2023 8:40 AM  Performed by: Georgianne Fick D, CRNAPre-anesthesia Checklist: Patient identified, Emergency Drugs available, Suction available and Patient being monitored Patient Re-evaluated:Patient Re-evaluated prior to induction Oxygen Delivery Method: Circle System Utilized Preoxygenation: Pre-oxygenation with 100% oxygen Induction Type: IV induction Ventilation: Two handed mask ventilation required and Oral airway inserted - appropriate to patient size Grade View: Grade II Tube type: Oral Tube size: 7.5 mm Number of attempts: 1 Airway Equipment and Method: Stylet and Oral airway Placement Confirmation: ETT inserted through vocal cords under direct vision, positive ETCO2 and breath sounds checked- equal and bilateral Secured at: 21 cm Tube secured with: Tape Dental Injury: Teeth and Oropharynx as per pre-operative assessment

## 2023-07-12 NOTE — Progress Notes (Signed)
  Progress Note    07/12/2023 2:16 PM Day of Surgery  Subjective:  no complaints, feeling good. Has not eaten yet, hungry   Vitals:   07/12/23 1230 07/12/23 1339  BP: (!) 149/88 (!) 143/88  Pulse: (!) 56 (!) 59  Resp: 14 14  Temp:    SpO2: 93% 93%   Physical Exam: Cardiac:  regular Lungs:  non labored Extremities:  B CF access sites clean, dry and intact without swelling or hematoma Abdomen:  soft, non distended Neurologic: alert and oriented  CBC    Component Value Date/Time   WBC 6.7 07/12/2023 1011   RBC 4.65 07/12/2023 1011   HGB 14.8 07/12/2023 1011   HCT 43.8 07/12/2023 1011   PLT 173 07/12/2023 1011   MCV 94.2 07/12/2023 1011   MCH 31.8 07/12/2023 1011   MCHC 33.8 07/12/2023 1011   RDW 14.1 07/12/2023 1011   LYMPHSABS 2.6 06/24/2023 0011   MONOABS 2.0 (H) 06/24/2023 0011   EOSABS 0.2 06/24/2023 0011   BASOSABS 0.1 06/24/2023 0011    BMET    Component Value Date/Time   NA 134 (L) 07/12/2023 1011   NA 138 07/01/2021 0953   K 3.6 07/12/2023 1011   CL 102 07/12/2023 1011   CO2 23 07/12/2023 1011   GLUCOSE 159 (H) 07/12/2023 1011   BUN 9 07/12/2023 1011   BUN 12 07/01/2021 0953   CREATININE 0.71 07/12/2023 1011   CALCIUM 8.9 07/12/2023 1011   GFRNONAA >60 07/12/2023 1011   GFRAA >60 01/16/2019 1122    INR    Component Value Date/Time   INR 1.2 07/12/2023 1206     Intake/Output Summary (Last 24 hours) at 07/12/2023 1416 Last data filed at 07/12/2023 1213 Gross per 24 hour  Intake 700 ml  Output 700 ml  Net 0 ml     Assessment/Plan:  67 y.o. male is s/p EVAR Day of Surgery   B groin access sites c/d/I without swelling or hematoma No back or abdominal pain BLE well perfused and warm with palpable DP Hemodynamically stable Okay for diet Mobilize later today Anticipate d/c tomorrow if he continues to progress well overnight   Graceann Congress, PA-C [facility] [phone] 07/12/2023 2:16 PM

## 2023-07-12 NOTE — Progress Notes (Signed)
Patient c/o foley leaking and burning. Checked to make sure that the balloon was inflated completed.  Patient c/o burning when urinating.   Removed foley with no issues.  Will continue to monitor

## 2023-07-12 NOTE — Anesthesia Procedure Notes (Signed)
Arterial Line Insertion Start/End11/13/2024 7:50 AM, 07/12/2023 7:55 AM Performed by: [name], MD, CRNA  Patient location: Pre-op. Preanesthetic checklist: patient identified, IV checked, site marked, risks and benefits discussed, surgical consent, monitors and equipment checked, pre-op evaluation, timeout performed and anesthesia consent Lidocaine 1% used for infiltration and patient sedated Left, radial was placed Catheter size: 20 G Hand hygiene performed  and maximum sterile barriers used   Attempts: 1 Procedure performed without using ultrasound guided technique. Following insertion, dressing applied and Biopatch. Post procedure assessment: normal and unchanged  Patient tolerated the procedure well with no immediate complications.

## 2023-07-12 NOTE — Anesthesia Postprocedure Evaluation (Signed)
Anesthesia Post Note  Patient: Jason Sweeney  Procedure(s) Performed: ABDOMINAL AORTIC ENDOVASCULAR STENT GRAFT     Patient location during evaluation: PACU Anesthesia Type: General Level of consciousness: awake and alert Pain management: pain level controlled Vital Signs Assessment: post-procedure vital signs reviewed and stable Respiratory status: spontaneous breathing, nonlabored ventilation and respiratory function stable Cardiovascular status: stable and blood pressure returned to baseline Anesthetic complications: no   No notable events documented.  Last Vitals:  Vitals:   07/12/23 1227 07/12/23 1230  BP: (!) 149/88 (!) 149/88  Pulse: (!) 59 (!) 56  Resp: 14 14  Temp: (!) 36.4 C   SpO2: 95% 93%    Last Pain:  Vitals:   07/12/23 1227  TempSrc: Oral  PainSc:                  [name]

## 2023-07-12 NOTE — Progress Notes (Addendum)
Patient brought to 4E from PACU. VSS. Telemetry box applied, CCMD notified. Patient oriented to room and staff. Call bell in reach.   Hannah Crill L Zavannah Deblois, RN  

## 2023-07-13 ENCOUNTER — Encounter (HOSPITAL_COMMUNITY): Payer: Self-pay | Admitting: Surgery

## 2023-07-13 LAB — CBC
HCT: 43.9 % (ref 39.0–52.0)
Hemoglobin: 15.3 g/dL (ref 13.0–17.0)
MCH: 32.1 pg (ref 26.0–34.0)
MCHC: 34.9 g/dL (ref 30.0–36.0)
MCV: 92.2 fL (ref 80.0–100.0)
Platelets: 180 10*3/uL (ref 150–400)
RBC: 4.76 MIL/uL (ref 4.22–5.81)
RDW: 13.6 % (ref 11.5–15.5)
WBC: 13.5 10*3/uL — ABNORMAL HIGH (ref 4.0–10.5)
nRBC: 0 % (ref 0.0–0.2)

## 2023-07-13 LAB — COMPREHENSIVE METABOLIC PANEL
ALT: 28 U/L (ref 0–44)
AST: 24 U/L (ref 15–41)
Albumin: 3 g/dL — ABNORMAL LOW (ref 3.5–5.0)
Alkaline Phosphatase: 43 U/L (ref 38–126)
Anion gap: 9 (ref 5–15)
BUN: 9 mg/dL (ref 8–23)
CO2: 21 mmol/L — ABNORMAL LOW (ref 22–32)
Calcium: 8.3 mg/dL — ABNORMAL LOW (ref 8.9–10.3)
Chloride: 102 mmol/L (ref 98–111)
Creatinine, Ser: 1.01 mg/dL (ref 0.61–1.24)
GFR, Estimated: >60 mL/min (ref >60)
Glucose, Bld: 141 mg/dL — ABNORMAL HIGH (ref 70–99)
Potassium: 3.4 mmol/L — ABNORMAL LOW (ref 3.5–5.1)
Sodium: 132 mmol/L — ABNORMAL LOW (ref 135–145)
Total Bilirubin: 1.1 mg/dL (ref <1.2)
Total Protein: 6.6 g/dL (ref 6.5–8.1)

## 2023-07-13 LAB — LIPID PANEL
Cholesterol: 137 mg/dL (ref 0–200)
HDL: 34 mg/dL — ABNORMAL LOW (ref >40)
LDL Cholesterol: 80 mg/dL (ref 0–99)
Total CHOL/HDL Ratio: 4 {ratio}
Triglycerides: 117 mg/dL (ref <150)
VLDL: 23 mg/dL (ref 0–40)

## 2023-07-13 LAB — GLUCOSE, CAPILLARY: Glucose-Capillary: 127 mg/dL — ABNORMAL HIGH (ref 70–99)

## 2023-07-13 MED ORDER — CIPROFLOXACIN HCL 500 MG PO TABS: 500.0000 mg | ORAL_TABLET | Freq: Two times a day (BID) | ORAL | 0 refills | Status: AC

## 2023-07-13 MED ORDER — ASPIRIN 81 MG PO TBEC: 81.0000 mg | DELAYED_RELEASE_TABLET | Freq: Every day | ORAL | 12 refills | Status: AC

## 2023-07-13 MED ORDER — ATORVASTATIN CALCIUM 10 MG PO TABS: 10.0000 mg | ORAL_TABLET | Freq: Every day | ORAL | 11 refills | Status: DC

## 2023-07-13 MED ORDER — ATORVASTATIN CALCIUM 80 MG PO TABS: 80.0000 mg | ORAL_TABLET | Freq: Every day | ORAL | 11 refills | Status: AC

## 2023-07-13 NOTE — Progress Notes (Signed)
PHARMACIST LIPID MONITORING   Jason Sweeney is a 67 y.o. male admitted on 07/12/2023 with PAD.  Pharmacy has been consulted to optimize lipid-lowering therapy with the indication of secondary prevention for clinical ASCVD.  Recent Labs:  Lipid Panel (last 6 months):   Lab Results  Component Value Date   CHOL 137 07/13/2023   TRIG 117 07/13/2023   HDL 34 (L) 07/13/2023   CHOLHDL 4.0 07/13/2023   VLDL 23 07/13/2023   LDLCALC 80 07/13/2023    Hepatic function panel (last 6 months):   Lab Results  Component Value Date   AST 24 07/13/2023   ALT 28 07/13/2023   ALKPHOS 43 07/13/2023   BILITOT 1.1 07/13/2023    SCr (since admission):   Serum creatinine: 1.01 mg/dL 60/45/40 9811 Estimated creatinine clearance: 75.7 mL/min  Current therapy and lipid therapy tolerance Current lipid-lowering therapy: None Previous lipid-lowering therapies (if applicable):  Documented or reported allergies or intolerances to lipid-lowering therapies (if applicable):   Assessment:   Patient agrees with changes to lipid-lowering therapy  Plan:    1.Statin intensity (high intensity recommended for all patients regardless of the LDL):  Add or increase statin to high intensity.  2.Add ezetimibe (if any one of the following):   Not indicated at this time.  3.Refer to lipid clinic:   No  4.Follow-up with:  Primary care provider - [facility]  5.Follow-up labs after discharge:  Changes in lipid therapy were made. Check a lipid panel in 8-12 weeks then annually.      [name], PharmD, BCIDP, AAHIVP, CPP Infectious Disease Pharmacist 07/13/2023 9:13 AM

## 2023-07-13 NOTE — TOC Transition Note (Signed)
Transition of Care (TOC) - CM/SW Discharge Note Donn Pierini RN, BSN Transitions of Care Unit 4E- RN Case Manager See Treatment Team for direct phone #   Patient Details  Name: Jason Sweeney MRN: [id number] Date of Birth: Oct 24, 1955  Transition of Care [facility]) CM/SW Contact:  Darrold Span, RN Phone Number: 07/13/2023, 11:45 AM   Clinical Narrative:    Pt stable for transition home today w/ family. Family to transport home.   CM notified by [facility] liaison that VSS office made referral for [facility] needs. Liaison has spoken with pt this am and pt voiced that he did not feel he needed HH at this time. [facility] will not start services and will update [facility] office.   No further TOC needs noted.    Final next level of care: Home/Self Care Barriers to Discharge: No Barriers Identified   Patient Goals and CMS Choice   Choice offered to / list presented to : Patient ([facility] office referral)  Discharge Placement               Home           Discharge Plan and Services Additional resources added to the After Visit Summary for     Discharge Planning Services: CM Consult Post Acute Care Choice: Home Health          DME Arranged: N/A DME Agency: NA       HH Arranged: Refused HH HH Agency: [facility] ([facility] office referral)        Social Determinants of Health (SDOH) Interventions SDOH Screenings   Food Insecurity: No Food Insecurity (06/19/2023)  Housing: Low Risk  (06/19/2023)  Transportation Needs: No Transportation Needs (06/19/2023)  Utilities: Not At Risk (06/19/2023)  Tobacco Use: Medium Risk (07/12/2023)     Readmission Risk Interventions    07/13/2023   11:45 AM  Readmission Risk Prevention Plan  Post Dischage Appt Complete  Medication Screening Complete  Transportation Screening Complete

## 2023-07-13 NOTE — Progress Notes (Signed)
Patient given discharge instructions, medication list and follow up appointments given to patient, In english and in spanish all questions answered. IV and tele removed. Will discharge home as ordered. Federica Allport, Randall An rN

## 2023-07-13 NOTE — Plan of Care (Signed)
  Problem: Skin Integrity: Goal: Risk for impaired skin integrity will decrease 07/13/2023 0146 by Jill Side, RN Outcome: Progressing 07/13/2023 0134 by Jill Side, RN Outcome: Progressing   Problem: Tissue Perfusion: Goal: Adequacy of tissue perfusion will improve Outcome: Progressing

## 2023-07-13 NOTE — Plan of Care (Signed)
  Problem: Skin Integrity: Goal: Risk for impaired skin integrity will decrease Outcome: Progressing   

## 2023-07-13 NOTE — Progress Notes (Addendum)
  Progress Note    07/13/2023 7:48 AM 1 Day Post-Op  Subjective:  no complaints, denies any pain in abdomen or back. Is still having some burning with urinating   Vitals:   07/12/23 2300 07/13/23 0300  BP: 132/77 (!) 136/94  Pulse: 87 81  Resp: 17 16  Temp:    SpO2: 91% 94%   Physical Exam: Cardiac:  regular Lungs:  non labored Incisions:  B groin access sites soft, without hematoma Extremities:  BLE well perfused and warm with palpable pedal pulses Abdomen:  soft, non distended Neurologic: alert and oriented  CBC    Component Value Date/Time   WBC 13.5 (H) 07/13/2023 0330   RBC 4.76 07/13/2023 0330   HGB 15.3 07/13/2023 0330   HCT 43.9 07/13/2023 0330   PLT 180 07/13/2023 0330   MCV 92.2 07/13/2023 0330   MCH 32.1 07/13/2023 0330   MCHC 34.9 07/13/2023 0330   RDW 13.6 07/13/2023 0330   LYMPHSABS 2.6 06/24/2023 0011   MONOABS 2.0 (H) 06/24/2023 0011   EOSABS 0.2 06/24/2023 0011   BASOSABS 0.1 06/24/2023 0011    BMET    Component Value Date/Time   NA 132 (L) 07/13/2023 0330   NA 138 07/01/2021 0953   K 3.4 (L) 07/13/2023 0330   CL 102 07/13/2023 0330   CO2 21 (L) 07/13/2023 0330   GLUCOSE 141 (H) 07/13/2023 0330   BUN 9 07/13/2023 0330   BUN 12 07/01/2021 0953   CREATININE 1.01 07/13/2023 0330   CALCIUM 8.3 (L) 07/13/2023 0330   GFRNONAA >60 07/13/2023 0330   GFRAA >60 01/16/2019 1122    INR    Component Value Date/Time   INR 1.2 07/12/2023 1206     Intake/Output Summary (Last 24 hours) at 07/13/2023 0748 Last data filed at 07/13/2023 0349 Gross per 24 hour  Intake 1900 ml  Output 2975 ml  Net -1075 ml     Assessment/Plan:  67 y.o. male is s/p #1: Endovascular repair of abdominal aortic aneurysm, aortobiiliac endograft (11914) #2: Bilateral ultrasound-guided access (78295) 1 Day Post-Op   No back pain or abdominal pain BLE well perfused and warm with palpable pedal pulses B groin access sites are soft without swelling or hematoma He is  tolerating diet and ambulating without difficulty Hemodynamically stable Is having some UTI symptoms. Leukocytes on UA. Will d/c on Abx Stable for discharge home today He is leaving to visit family in [place] for 3 months in December so will arrange closer interval follow up with ultrasound EVAR   Graceann Congress, PA-C [facility] [phone] 07/13/2023 7:48 AM  I agree with the above.  Doing excellent, postop day 1 from endovascular aneurysm repair.  Anticipate discharge today.  I am going to have him follow-up in the PA clinic with a ultrasound prior to December 4 which is when he leaves for [place].  He can then see me at a 79-month follow-up with a CT angiogram.  We are going to give him Cipro for a few days because of a chronic UTI.  Durene Cal

## 2023-07-13 NOTE — Discharge Instructions (Signed)
  [facility] [place]   Discharge Instructions  Endovascular Aortic Aneurysm Repair  Please refer to the following instructions for your post-procedure care. Your surgeon or Physician Assistant will discuss any changes with you.  Activity  You are encouraged to walk as much as you can. You can slowly return to normal activities but must avoid strenuous activity and heavy lifting until your doctor tells you it's OK. Avoid activities such as vacuuming or swinging a gold club. It is normal to feel tired for several weeks after your surgery. Do not drive until your doctor gives the OK and you are no longer taking prescription pain medications. It is also normal to have difficulty with sleep habits, eating, and bowel movements after surgery. These will go away with time.  Bathing/Showering  Shower daily after you go home.  Do not soak in a bathtub, hot tub, or swim until the incision heals completely.  If you have incisions in your groin, wash the groin wounds with soap and water daily and pat dry. (No tub bath-only shower)  Then put a dry gauze or washcloth there to keep this area dry to help prevent wound infection daily and as needed.  Do not use Vaseline or neosporin on your incisions.  Only use soap and water on your incisions and then protect and keep dry.  Incision Care  Shower every day. Clean your incision with mild soap and water. Pat the area dry with a clean towel. You do not need a bandage unless otherwise instructed. Do not apply any ointments or creams to your incision. If you clothing is irritating, you may cover your incision with a dry gauze pad.  Diet  Resume your normal diet. There are no special food restrictions following this procedure. A low fat/low cholesterol diet is recommended for all patients with vascular disease. In order to heal from your surgery, it is CRITICAL to get adequate nutrition. Your body requires vitamins, minerals, and protein.  Vegetables are the best source of vitamins and minerals. Vegetables also provide the perfect balance of protein. Processed food has little nutritional value, so try to avoid this.  Medications  Resume taking all of your medications unless your doctor or nurse practitioner tells you not to. If your incision is causing pain, you may take over-the-counter pain relievers such as acetaminophen (Tylenol). If you were prescribed a stronger pain medication, please be aware these medications can cause nausea and constipation. Prevent nausea by taking the medication with a snack or meal. Avoid constipation by drinking plenty of fluids and eating foods with a high amount of fiber, such as fruits, vegetables, and grains.  Do not take Tylenol if you are taking prescription pain medications.   Follow up  Our office will schedule a follow-up appointment with a CT scan 3-4 weeks after your surgery.  Please call us immediately for any of the following conditions  Severe or worsening pain in your legs or feet or in your abdomen back or chest. Increased pain, redness, drainage (pus) from your incision site. Increased abdominal pain, bloating, nausea, vomiting or persistent diarrhea. Fever of 101 degrees or higher. Swelling in your leg (s),  Reduce your risk of vascular disease  Stop smoking. If you would like help call [facility] at 1-800-QUIT-NOW (1-800-784-8669) or [place] at 336-586-4000. Manage your cholesterol Maintain a desired weight Control your diabetes Keep your blood pressure down  If you have questions, please call the office at [phone].  

## 2023-07-14 ENCOUNTER — Other Ambulatory Visit: Payer: Self-pay | Admitting: *Deleted

## 2023-07-14 DIAGNOSIS — Z9889 Other specified postprocedural states: Secondary | ICD-10-CM

## 2023-07-17 ENCOUNTER — Other Ambulatory Visit (HOSPITAL_COMMUNITY): Payer: 59

## 2023-07-17 LAB — POCT ACTIVATED CLOTTING TIME: Activated Clotting Time: 239 s

## 2023-07-17 NOTE — Discharge Summary (Signed)
[facility] Discharge Summary   Jason Sweeney 1955/12/13 67 y.o. male  MRN: [id number]  Admission Date: 07/12/2023  Discharge Date: 07/13/2023  Physician: Nada Libman, MD  Admission Diagnosis: AAA (abdominal aortic aneurysm) [place]) [I71.40]   Hospital Course:  The patient was admitted to the hospital and taken to the operating room on 07/12/2023 and underwent:   #1: Endovascular repair of abdominal aortic aneurysm, aortobiiliac endograft ([id number]) #2: Bilateral ultrasound-guided access [phone]).  The pt tolerated the procedure well and was transported to the PACU in good condition.   Patient did well post operatively and progressed well overnight. Did report some burning with urination. UA showing some leukocytosis so he was started on Cipro. Otherwise he was without any back or abdominal pain. Bilateral groin access sites were soft without hematoma. Bilateral lower extremities remained well perfused and warm with palpable pedal pulses. He remained hemodynamically stable. The remainder of the hospital course consisted of increasing mobilization and increasing intake of solids without difficulty.  He remained stable for discharge home POD#1. He will resume all home medications as prescribed. New prescriptions for Aspirin 81 mg, Atorvastatin 80 mg, and Ciprofloxacin 500 mg were sent to his pharmacy. He is planning to travel to visit his Family in [place] on 08/02/23. He therefore will be seen for follow up in our office prior to that date with EVAR duplex.   CBC    Component Value Date/Time   WBC 13.5 (H) 07/13/2023 0330   RBC 4.76 07/13/2023 0330   HGB 15.3 07/13/2023 0330   HCT 43.9 07/13/2023 0330   PLT 180 07/13/2023 0330   MCV 92.2 07/13/2023 0330   MCH 32.1 07/13/2023 0330   MCHC 34.9 07/13/2023 0330   RDW 13.6 07/13/2023 0330   LYMPHSABS 2.6 06/24/2023 0011   MONOABS 2.0 (H) 06/24/2023 0011   EOSABS 0.2 06/24/2023 0011   BASOSABS 0.1 06/24/2023 0011    BMET    Component  Value Date/Time   NA 132 (L) 07/13/2023 0330   NA 138 07/01/2021 0953   K 3.4 (L) 07/13/2023 0330   CL 102 07/13/2023 0330   CO2 21 (L) 07/13/2023 0330   GLUCOSE 141 (H) 07/13/2023 0330   BUN 9 07/13/2023 0330   BUN 12 07/01/2021 0953   CREATININE 1.01 07/13/2023 0330   CALCIUM 8.3 (L) 07/13/2023 0330   GFRNONAA >60 07/13/2023 0330   GFRAA >60 01/16/2019 1122       Discharge Instructions     ABDOMINAL PROCEDURE/ANEURYSM REPAIR/AORTO-BIFEMORAL BYPASS:  Call MD for increased abdominal pain; cramping diarrhea; nausea/vomiting   Complete by: As directed    Call MD for:  redness, tenderness, or signs of infection (pain, swelling, bleeding, redness, odor or green/yellow discharge around incision site)   Complete by: As directed    Call MD for:  severe or increased pain, loss or decreased feeling  in affected limb(s)   Complete by: As directed    Call MD for:  temperature >100.5   Complete by: As directed    Driving Restrictions   Complete by: As directed    No driving for 3 days   Increase activity slowly   Complete by: As directed    Walk with assistance use walker or cane as needed   Lifting restrictions   Complete by: As directed    No heavy lifting for 4 weeks   May shower    Complete by: As directed    Resume previous diet   Complete by: As directed  may wash over wound with mild soap and water   Complete by: As directed        Discharge Diagnosis:  AAA (abdominal aortic aneurysm) (HCC) [I71.40]  Secondary Diagnosis: Patient Active Problem List   Diagnosis Date Noted   AAA (abdominal aortic aneurysm) (HCC) 07/12/2023   Severe sepsis (HCC) 06/18/2023   UTI (urinary tract infection) 06/18/2023   DM (diabetes mellitus) (HCC) 06/18/2023   AAA (abdominal aortic aneurysm) without rupture (HCC) 12/15/2017   Abnormal [place] (ultrasound) of abdomen    SVT (supraventricular tachycardia) (HCC) 12/14/2017   AKI (acute kidney injury) (HCC) 12/14/2017   HTN (hypertension)  12/14/2017   Elevated troponin 12/14/2017   Past Medical History:  Diagnosis Date   AAA (abdominal aortic aneurysm) (HCC)    AKI (acute kidney injury) (HCC) 12/14/2017   Chronic neck pain    Diabetes mellitus without complication (HCC)    History of kidney stones    HTN (hypertension) 12/14/2017   SVT (supraventricular tachycardia) (HCC) 12/14/2017     Allergies as of 07/13/2023   No Known Allergies      Medication List     TAKE these medications    acetaminophen 325 MG tablet Commonly known as: TYLENOL Take 650 mg by mouth every 6 (six) hours as needed for moderate pain (pain score 4-6).   amLODipine 10 MG tablet Commonly known as: NORVASC Take 10 mg by mouth daily.   aspirin EC 81 MG tablet Take 1 tablet (81 mg total) by mouth daily at 6 (six) AM. Swallow whole.   atorvastatin 80 MG tablet Commonly known as: Lipitor Take 1 tablet (80 mg total) by mouth daily.   carvedilol 3.125 MG tablet Commonly known as: COREG Take 1 tablet (3.125 mg total) by mouth 2 (two) times daily.   ciprofloxacin 500 MG tablet Commonly known as: Cipro Take 1 tablet (500 mg total) by mouth 2 (two) times daily for 7 days.   furosemide 20 MG tablet Commonly known as: Lasix Take 1 tablet (20 mg total) by mouth daily for 7 days.   lidocaine 4 % Commonly known as: [place] Pain Relieving Place 1 patch onto the skin every 12 (twelve) hours.   losartan 25 MG tablet Commonly known as: COZAAR Take 1 tablet (25 mg total) by mouth daily.   metFORMIN 500 MG 24 hr tablet Commonly known as: GLUCOPHAGE-XR Take 500 mg by mouth 2 (two) times daily.   naproxen 375 MG tablet Commonly known as: NAPROSYN Take 1 tablet (375 mg total) by mouth 2 (two) times daily.   Potassium Chloride ER 20 MEQ Tbcr Take 1 tablet (20 mEq total) by mouth daily for 7 days. -- Take while taking Furosemide/Lasix        Discharge Instructions:   [facility]  Discharge  Instructions Endovascular Aortic Aneurysm Repair  Please refer to the following instructions for your post-procedure care. Your surgeon or Physician Assistant will discuss any changes with you.  Activity  You are encouraged to walk as much as you can. You can slowly return to normal activities but must avoid strenuous activity and heavy lifting until your doctor tells you it's OK. Avoid activities such as vacuuming or swinging a gold club. It is normal to feel tired for several weeks after your surgery. Do not drive until your doctor gives the OK and you are no longer taking prescription pain medications. It is also normal to have difficulty with sleep habits, eating, and bowel movements after surgery. These  will go away with time.  Bathing/Showering  You may shower after you go home. If you have an incision, do not soak in a bathtub, hot tub, or swim until the incision heals completely.  Incision Care  Shower every day. Clean your incision with mild soap and water. Pat the area dry with a clean towel. You do not need a bandage unless otherwise instructed. Do not apply any ointments or creams to your incision. If you clothing is irritating, you may cover your incision with a dry gauze pad.  Diet  Resume your normal diet. There are no special food restrictions following this procedure. A low fat/low cholesterol diet is recommended for all patients with vascular disease. In order to heal from your surgery, it is CRITICAL to get adequate nutrition. Your body requires vitamins, minerals, and protein. Vegetables are the best source of vitamins and minerals. Vegetables also provide the perfect balance of protein. Processed food has little nutritional value, so try to avoid this.  Medications  Resume taking all of your medications unless your doctor or Physician Assistnat tells you not to. If your incision is causing pain, you may take over-the-counter pain relievers such as acetaminophen (Tylenol).  If you were prescribed a stronger pain medication, please be aware these medications can cause nausea and constipation. Prevent nausea by taking the medication with a snack or meal. Avoid constipation by drinking plenty of fluids and eating foods with a high amount of fiber, such as fruits, vegetables, and grains. Do not take Tylenol if you are taking prescription pain medications.   Follow up  Our office will schedule a follow-up appointment with a C.T. scan 3-4 weeks after your surgery.  Please call us immediately for any of the following conditions  Severe or worsening pain in your legs or feet or in your abdomen back or chest. Increased pain, redness, drainage (pus) from your incision sit. Increased abdominal pain, bloating, nausea, vomiting or persistent diarrhea. Fever of 101 degrees or higher. Swelling in your leg (s),  Reduce your risk of vascular disease  Stop smoking. If you would like help call [facility] at 1-800-QUIT-NOW (820 686 0528) or [place] at [phone]. Manage your cholesterol Maintain a desired weight Control your diabetes Keep your blood pressure down  If you have questions, please call the office at [phone].    Prescriptions given: Aspirin 81 mg 1 tablet daily #30 11 Refills Atorvastatin 80 mg 1 tablet daily #30 11 refills Ciprofloxacin 500 mg BID for 7 days #14 no refill  Disposition: Home  Patient's condition: is Excellent  Follow up: 1. Dr. Myra Gianotti in 2 weeks with [facility] duplex    Graceann Congress, PA-C [facility] [phone] 07/17/2023  5:02 AM   - For VQI Registry use - Post-op:  Time to Extubation: [X]  In OR, [ ]  < 12 hrs, [ ]  12-24 hrs, [ ]  >=24 hrs Vasopressors Req. Post-op: No MI: No., [ ]  Troponin only, [ ]  EKG or Clinical New Arrhythmia: No CHF: No ICU Stay: no days in ICU Transfusion: No     If yes, 0 units given  Complications: Resp failure: No., [ ]  Pneumonia, [ ]  Ventilator Chg in renal  function: No., [ ]  Inc. Cr > 0.5, [ ]  Temp. Dialysis,  [ ]  Permanent dialysis Leg ischemia: No., no Surgery needed, [ ]  Yes, Surgery needed,  [ ]  Amputation Bowel ischemia: No., [ ]  Medical Rx, [ ]  Surgical Rx Wound complication: No., [ ]  Superficial separation/infection, [ ]  Return to  OR Return to OR: No  Return to OR for bleeding: No Stroke: No., [ ]  Minor, [ ]  Major  Discharge medications: Statin use:  Yes  ASA use:  Yes  Plavix use:  No  Beta blocker use:  Yes  ARB use:  Yes ACEI use:  No CCB use:  Yes

## 2023-07-19 ENCOUNTER — Other Ambulatory Visit: Payer: Self-pay

## 2023-07-19 ENCOUNTER — Encounter (HOSPITAL_COMMUNITY): Payer: Self-pay

## 2023-07-19 ENCOUNTER — Emergency Department (HOSPITAL_COMMUNITY)
Admission: EM | Admit: 2023-07-19 | Discharge: 2023-07-20 | Disposition: A | Discharge status: Home / Self Care | Payer: 59 | Attending: Emergency Medicine | Admitting: Emergency Medicine

## 2023-07-19 DIAGNOSIS — M542 Cervicalgia: Secondary | ICD-10-CM

## 2023-07-19 DIAGNOSIS — I10 Essential (primary) hypertension: Secondary | ICD-10-CM | POA: Insufficient documentation

## 2023-07-19 DIAGNOSIS — M5030 Other cervical disc degeneration, unspecified cervical region: Secondary | ICD-10-CM | POA: Insufficient documentation

## 2023-07-19 DIAGNOSIS — Z87891 Personal history of nicotine dependence: Secondary | ICD-10-CM | POA: Diagnosis not present

## 2023-07-19 DIAGNOSIS — E119 Type 2 diabetes mellitus without complications: Secondary | ICD-10-CM | POA: Insufficient documentation

## 2023-07-19 MED ORDER — METHOCARBAMOL 500 MG PO TABS: 500.0000 mg | ORAL_TABLET | Freq: Three times a day (TID) | ORAL | 0 refills | Status: AC | PRN

## 2023-07-19 MED ORDER — OXYCODONE HCL 5 MG PO TABS
5.0000 mg | ORAL_TABLET | Freq: Once | ORAL | Status: AC
  Administered 2023-07-19: 5 mg via ORAL
  Filled 2023-07-19: qty 1

## 2023-07-19 MED ORDER — KETOROLAC TROMETHAMINE 15 MG/ML IJ SOLN
30.0000 mg | Freq: Once | INTRAMUSCULAR | Status: AC
  Administered 2023-07-19: 30 mg via INTRAMUSCULAR
  Filled 2023-07-19: qty 2

## 2023-07-19 MED ORDER — NAPROXEN 500 MG PO TABS
500.0000 mg | ORAL_TABLET | Freq: Two times a day (BID) | ORAL | 0 refills | Status: AC
Start: 2023-07-19 — End: ?

## 2023-07-19 NOTE — ED Provider Notes (Signed)
[facility]-EMERGENCY DEPT [facility] Emergency Department Provider Note MRN:  [id number]  Arrival date & time: 07/19/23     Chief Complaint   Neck Pain   History of Present Illness   Jason Sweeney is a 67 y.o. year-old male with a history of hypertension, diabetes presenting to the ED with chief complaint of neck pain.  Pain to the posterior neck, woke up and it was sore and stiff 2 days ago.  Has had neck pain on and off for years.  Denies any numbness or weakness to the arms or legs, no bowel or bladder dysfunction, no fever.  Review of Systems  A thorough review of systems was obtained and all systems are negative except as noted in the HPI and PMH.   Patient's Health History    Past Medical History:  Diagnosis Date   AAA (abdominal aortic aneurysm) (HCC)    AKI (acute kidney injury) (HCC) 12/14/2017   Chronic neck pain    Diabetes mellitus without complication (HCC)    History of kidney stones    HTN (hypertension) 12/14/2017   SVT (supraventricular tachycardia) (HCC) 12/14/2017    Past Surgical History:  Procedure Laterality Date   ABDOMINAL AORTIC ENDOVASCULAR STENT GRAFT N/A 07/12/2023   Procedure: ABDOMINAL AORTIC ENDOVASCULAR STENT GRAFT;  Surgeon: Nada Libman, MD;  Location: [facility] OR;  Service: Vascular;  Laterality: N/A;   ABDOMINAL SURGERY     SHOULDER SURGERY Left    SHOULDER SURGERY     SVT ABLATION N/A 02/09/2018   Procedure: SVT ABLATION;  Surgeon: Marinus Maw, MD;  Location: [facility] INVASIVE CV LAB;  Service: Cardiovascular;  Laterality: N/A;    Family History  Problem Relation Age of Onset   Hypertension Mother    Heart disease Mother     Social History   Socioeconomic History   Marital status: Married    Spouse name: Not on file   Number of children: Not on file   Years of education: Not on file   Highest education level: Not on file  Occupational History   Not on file  Tobacco Use   Smoking status: Former    Current packs/day: 0.00     Types: Cigarettes    Quit date: 08/29/1993    Years since quitting: 29.9   Smokeless tobacco: Never  Vaping Use   Vaping status: Never Used  Substance and Sexual Activity   Alcohol use: Yes    Comment: occasionally   Drug use: Not Currently   Sexual activity: Not on file  Other Topics Concern   Not on file  Social History Narrative   Not on file   Social Determinants of Health   Financial Resource Strain: Not on file  Food Insecurity: No Food Insecurity (06/19/2023)   Hunger Vital Sign    Worried About Running Out of Food in the Last Year: Never true    Ran Out of Food in the Last Year: Never true  Transportation Needs: No Transportation Needs (06/19/2023)   PRAPARE - Administrator, Civil Service (Medical): No    Lack of Transportation (Non-Medical): No  Physical Activity: Not on file  Stress: Not on file  Social Connections: Not on file  Intimate Partner Violence: Not At Risk (06/19/2023)   Humiliation, Afraid, Rape, and Kick questionnaire    Fear of Current or Ex-Partner: No    Emotionally Abused: No    Physically Abused: No    Sexually Abused: No     Physical Exam  Vitals:   07/19/23 2123 07/19/23 2300  BP: (!) 149/94 (!) 143/80  Pulse: 73 60  Resp: 18   Temp: 98.9 F (37.2 C)   SpO2: 95% 95%    CONSTITUTIONAL: Well-appearing, NAD NEURO/PSYCH:  Alert and oriented x 3, normal and symmetric strength and sensation, normal coordination, normal speech EYES:  eyes equal and reactive ENT/NECK:  no LAD, no JVD CARDIO: Regular rate, well-perfused, normal S1 and S2 PULM:  CTAB no wheezing or rhonchi GI/GU:  non-distended, non-tender MSK/SPINE:  No gross deformities, no edema SKIN:  no rash, atraumatic   *Additional and/or pertinent findings included in MDM below  Diagnostic and Interventional Summary    EKG Interpretation Date/Time:    Ventricular Rate:    PR Interval:    QRS Duration:    QT Interval:    QTC Calculation:   R Axis:      Text  Interpretation:         Labs Reviewed - No data to display  No orders to display    Medications  ketorolac (TORADOL) 15 MG/ML injection 30 mg (30 mg Intramuscular Given 07/19/23 2332)  oxyCODONE (Oxy IR/ROXICODONE) immediate release tablet 5 mg (5 mg Oral Given 07/19/23 2332)     Procedures  /  Critical Care Procedures  ED Course and Medical Decision Making  Initial Impression and Ddx Neck pain, tenderness to the musculature of the posterior neck and occipital region, pain elicited with movement.  Seems consistent with MSK, no signs or symptoms of myelopathy.  CT imaging last month showing degenerative disc disease of the cervical spine with an osteophyte showing some involvement or mass effect on the thecal sac.  This is a bit concerning and definitely needs follow-up however with patient's reassuring exam and overall mild pain at this time he is appropriate for discharge with strict return precautions and neurosurgery follow-up.  Past medical/surgical history that increases complexity of ED encounter: Degenerative disc disease of the cervical spine, diabetes, hypertension  Interpretation of Diagnostics Laboratory and/or imaging options to aid in the diagnosis/care of the patient were considered.  After careful history and physical examination, it was determined that there was no indication for diagnostics at this time.  Patient Reassessment and Ultimate Disposition/Management     Discharge  Patient management required discussion with the following services or consulting groups:  None  Complexity of Problems Addressed Acute illness or injury that poses threat of life of bodily function  Additional Data Reviewed and Analyzed Further history obtained from: Prior labs/imaging results  Additional Factors Impacting ED Encounter Risk Prescriptions  Elmer Sow. Pilar Plate, MD [facility] Emergency Medicine [facility] [email or web address]  Final Clinical  Impressions(s) / ED Diagnoses     ICD-10-CM   1. Neck pain  M54.2     2. Degenerative disc disease, cervical  M50.30       ED Discharge Orders          Ordered    naproxen (NAPROSYN) 500 MG tablet  2 times daily        07/19/23 2345    methocarbamol (ROBAXIN) 500 MG tablet  Every 8 hours PRN        07/19/23 2345             Discharge Instructions Discussed with and Provided to Patient:    Discharge Instructions      You were evaluated in the Emergency Department and after careful evaluation, we did not find any emergent condition requiring admission or further testing  in the hospital.  Your exam/testing today is overall reassuring.  You have degenerative disc disease of your neck or cervical spine.  Important that you follow-up with a spine expert.  Recommend using the Naprosyn twice daily as prescribed for pain.  Can use the Robaxin muscle relaxer for more significant pain, best used at night if you are having trouble sleeping as it can cause drowsiness.  Please return to the Emergency Department if you experience any worsening of your condition.   Thank you for allowing [place] to be a part of your care.      Sabas Sous, MD 07/19/23 [phone]

## 2023-07-19 NOTE — Discharge Instructions (Signed)
You were evaluated in the Emergency Department and after careful evaluation, we did not find any emergent condition requiring admission or further testing in the hospital.  Your exam/testing today is overall reassuring.  You have degenerative disc disease of your neck or cervical spine.  Important that you follow-up with a spine expert.  Recommend using the Naprosyn twice daily as prescribed for pain.  Can use the Robaxin muscle relaxer for more significant pain, best used at night if you are having trouble sleeping as it can cause drowsiness.  Please return to the Emergency Department if you experience any worsening of your condition.   Thank you for allowing [place] to be a part of your care.

## 2023-07-19 NOTE — ED Triage Notes (Signed)
Pt reports neck/shoulder pain, "feels the same as last time" pt was seen one month ago here for the same, CT was done, dx with cervical strain, pt denies injury.

## 2023-07-20 DIAGNOSIS — I10 Essential (primary) hypertension: Secondary | ICD-10-CM | POA: Diagnosis not present

## 2023-07-20 DIAGNOSIS — I714 Abdominal aortic aneurysm, without rupture, unspecified: Secondary | ICD-10-CM | POA: Diagnosis not present

## 2023-07-20 DIAGNOSIS — Z1329 Encounter for screening for other suspected endocrine disorder: Secondary | ICD-10-CM | POA: Diagnosis not present

## 2023-07-20 NOTE — ED Notes (Signed)
Reviewed D/C information with the patient, pt verbalized understanding. No additional concerns at this time.

## 2023-07-31 ENCOUNTER — Ambulatory Visit (INDEPENDENT_AMBULATORY_CARE_PROVIDER_SITE_OTHER): Payer: 59 | Admitting: Physician Assistant

## 2023-07-31 ENCOUNTER — Ambulatory Visit (HOSPITAL_COMMUNITY)
Admission: RE | Admit: 2023-07-31 | Discharge: 2023-07-31 | Disposition: A | Discharge status: Home / Self Care | Payer: 59 | Source: Ambulatory Visit | Attending: Surgery | Admitting: Surgery

## 2023-07-31 VITALS — BP 151/88 | HR 56 | Temp 98.2°F | Ht 65.0 in | Wt 215.9 lb

## 2023-07-31 DIAGNOSIS — Z9889 Other specified postprocedural states: Secondary | ICD-10-CM

## 2023-07-31 DIAGNOSIS — I7143 Infrarenal abdominal aortic aneurysm, without rupture: Secondary | ICD-10-CM

## 2023-07-31 NOTE — Progress Notes (Signed)
  POST OPERATIVE OFFICE NOTE    CC:  F/u for surgery  HPI:  This is a 67 y.o. male who is s/p endovascular repair of abdominal aortic aneurysm measuring 5.5 cm by Dr. Myra Gianotti on 07/12/2023.  He had an uneventful hospital stay and was discharged on postoperative day #1.  Patient denies any new or changing abdominal or back pain.  He is ambulating well without claudication.  He believes his groin incisions are well-healed.  He is taking a daily aspirin and statin.  He is leaving on Wednesday to travel to [place] for the next 5 months.  No Known Allergies  Current Outpatient Medications  Medication Sig Dispense Refill   acetaminophen (TYLENOL) 325 MG tablet Take 650 mg by mouth every 6 (six) hours as needed for moderate pain (pain score 4-6).     amLODipine (NORVASC) 10 MG tablet Take 10 mg by mouth daily.     aspirin EC 81 MG tablet Take 1 tablet (81 mg total) by mouth daily at 6 (six) AM. Swallow whole. 30 tablet 12   atorvastatin (LIPITOR) 80 MG tablet Take 1 tablet (80 mg total) by mouth daily. 30 tablet 11   carvedilol (COREG) 3.125 MG tablet Take 1 tablet (3.125 mg total) by mouth 2 (two) times daily. 540 tablet 0   lidocaine (SALONPAS PAIN RELIEVING) 4 % Place 1 patch onto the skin every 12 (twelve) hours. 15 patch 0   losartan (COZAAR) 25 MG tablet Take 1 tablet (25 mg total) by mouth daily. 365 tablet 0   metFORMIN (GLUCOPHAGE-XR) 500 MG 24 hr tablet Take 500 mg by mouth 2 (two) times daily.     methocarbamol (ROBAXIN) 500 MG tablet Take 1 tablet (500 mg total) by mouth every 8 (eight) hours as needed for muscle spasms. 30 tablet 0   naproxen (NAPROSYN) 500 MG tablet Take 1 tablet (500 mg total) by mouth 2 (two) times daily. 30 tablet 0   furosemide (LASIX) 20 MG tablet Take 1 tablet (20 mg total) by mouth daily for 7 days. (Patient not taking: Reported on 07/05/2023) 7 tablet 0   Potassium Chloride ER 20 MEQ TBCR Take 1 tablet (20 mEq total) by mouth daily for 7 days. -- Take while taking  Furosemide/Lasix (Patient not taking: Reported on 07/05/2023) 7 tablet 0   No current facility-administered medications for this visit.     ROS:  See HPI  Physical Exam:  Vitals:   07/31/23 0822  BP: (!) 151/88  Pulse: (!) 56  Temp: 98.2 F (36.8 C)  TempSrc: Temporal  SpO2: 93%  Weight: 215 lb 14.4 oz (97.9 kg)  Height: 5\' 5"  (1.651 m)    Incision: Groin incisions well-healed Extremities: Palpable DP pulses Neuro: A&O Abdomen: Soft nontender  Assessment/Plan:  This is a 67 y.o. male who is s/p: Endovascular repair of 5.5 cm abdominal aortic aneurysm  Bilateral lower extremities are well-perfused with palpable DP pulses Groin incisions are well-healed EVAR duplex demonstrates a widely patent endograft without endoleaks.  Maximum diameter of AAA sac is 5.45 cm today Plan is to check a CTA abdomen and pelvis when patient returns from his 54-month trip to [place].  He will continue his aspirin and statin daily.   Emilie Rutter, PA-C [facility] [phone]  Clinic MD:  Myra Gianotti

## 2023-11-30 ENCOUNTER — Other Ambulatory Visit: Payer: Self-pay

## 2023-11-30 DIAGNOSIS — Z9889 Other specified postprocedural states: Secondary | ICD-10-CM

## 2023-12-05 ENCOUNTER — Encounter: Payer: Self-pay | Admitting: *Deleted

## 2023-12-05 DIAGNOSIS — E1165 Type 2 diabetes mellitus with hyperglycemia: Secondary | ICD-10-CM | POA: Diagnosis not present

## 2023-12-05 DIAGNOSIS — I714 Abdominal aortic aneurysm, without rupture, unspecified: Secondary | ICD-10-CM | POA: Diagnosis not present

## 2023-12-05 DIAGNOSIS — D649 Anemia, unspecified: Secondary | ICD-10-CM | POA: Diagnosis not present

## 2023-12-05 DIAGNOSIS — Z23 Encounter for immunization: Secondary | ICD-10-CM | POA: Diagnosis not present

## 2023-12-05 DIAGNOSIS — E7800 Pure hypercholesterolemia, unspecified: Secondary | ICD-10-CM | POA: Diagnosis not present

## 2023-12-05 DIAGNOSIS — E039 Hypothyroidism, unspecified: Secondary | ICD-10-CM | POA: Diagnosis not present

## 2023-12-05 DIAGNOSIS — I10 Essential (primary) hypertension: Secondary | ICD-10-CM | POA: Diagnosis not present

## 2023-12-22 ENCOUNTER — Ambulatory Visit (HOSPITAL_COMMUNITY)
Admission: RE | Admit: 2023-12-22 | Discharge: 2023-12-22 | Disposition: A | Discharge status: Home / Self Care | Source: Ambulatory Visit | Attending: Surgery | Admitting: Surgery

## 2023-12-22 ENCOUNTER — Encounter (HOSPITAL_COMMUNITY): Payer: Self-pay | Admitting: Radiology

## 2023-12-22 DIAGNOSIS — N281 Cyst of kidney, acquired: Secondary | ICD-10-CM | POA: Diagnosis not present

## 2023-12-22 DIAGNOSIS — Z9889 Other specified postprocedural states: Secondary | ICD-10-CM | POA: Diagnosis not present

## 2023-12-22 DIAGNOSIS — K429 Umbilical hernia without obstruction or gangrene: Secondary | ICD-10-CM | POA: Diagnosis not present

## 2023-12-22 DIAGNOSIS — K5730 Diverticulosis of large intestine without perforation or abscess without bleeding: Secondary | ICD-10-CM | POA: Diagnosis not present

## 2023-12-22 LAB — POCT I-STAT CREATININE: Creatinine, Ser: 0.8 mg/dL (ref 0.61–1.24)

## 2023-12-22 MED ORDER — IOHEXOL 350 MG/ML SOLN
100.0000 mL | Freq: Once | INTRAVENOUS | Status: AC | PRN
  Administered 2023-12-22: 100 mL via INTRAVENOUS

## 2023-12-27 ENCOUNTER — Encounter: Payer: Self-pay | Admitting: Internal Medicine

## 2023-12-27 ENCOUNTER — Ambulatory Visit (INDEPENDENT_AMBULATORY_CARE_PROVIDER_SITE_OTHER): Admitting: Internal Medicine

## 2023-12-27 VITALS — BP 134/86 | HR 78 | Temp 97.5°F | Ht 66.0 in | Wt 224.9 lb

## 2023-12-27 DIAGNOSIS — Z860101 Personal history of adenomatous and serrated colon polyps: Secondary | ICD-10-CM | POA: Diagnosis not present

## 2023-12-27 DIAGNOSIS — R300 Dysuria: Secondary | ICD-10-CM | POA: Diagnosis not present

## 2023-12-27 DIAGNOSIS — R7989 Other specified abnormal findings of blood chemistry: Secondary | ICD-10-CM

## 2023-12-27 DIAGNOSIS — K760 Fatty (change of) liver, not elsewhere classified: Secondary | ICD-10-CM

## 2023-12-27 NOTE — Patient Instructions (Addendum)
 Hoy me har anlisis de [employer]. Le llamaremos con [employer]. Tambin har un anlisis de [place] para descartar una infeccin.  Solicitar su ecografa a [employer].  Cita de seguimiento en 6 meses o antes si es necesario.  Un placer conocerla hoy.  Dr. Mordechai April  [facility] [facility] [employer]:  [address] [place], [place] [address]

## 2023-12-27 NOTE — Progress Notes (Signed)
 Primary Care Physician:  Lauran Pollard, MD Primary Gastroenterologist:  Dr. Mordechai April  Chief Complaint  Patient presents with   Fatty liver    Patient here today after being referred due to a fatty liver. Patient denies any current gi issues.     HPI:   Jason Sweeney is a 67 y.o. male who presents to the clinic today by referral from his PCP Dr. Broadus Canes for evaluation.  Patient Spanish-speaking, in person interpreter used today. Patient has a history of AAA status postrepair, diabetes, hypertension, dyslipidemia.  Abnormal LFTs, hepatic steatosis: Patient has had intermittently elevated LFTs.  Most recent blood work done at PCP office showed AST 67, ALT 46, T. bili 0.7, alk phos 65, albumin 3.8, platelets 396.  Patient reportedly had an [facility] done at [facility] which showed fatty liver though I cannot see this report.  Patient did undergo CT angio abdomen pelvis with and without contrast 4/25/25which I personally reviewed which showed normal liver, status post cholecystectomy.  Patient reports minimal alcohol use, only when he travels to [place].  Father did die from cirrhosis.  No exposure to viral hepatitis that he is aware of.  History of adenomatous colon polyps: Colonoscopy 2024 with 2 small tubular adenomas removed.  No family history of colorectal malignancy.  No melena hematochezia.  No unintentional weight loss.   Past Medical History:  Diagnosis Date   AAA (abdominal aortic aneurysm) (HCC)    AKI (acute kidney injury) (HCC) 12/14/2017   Chronic neck pain    Diabetes mellitus without complication (HCC)    History of kidney stones    HTN (hypertension) 12/14/2017   SVT (supraventricular tachycardia) (HCC) 12/14/2017    Past Surgical History:  Procedure Laterality Date   ABDOMINAL AORTIC ENDOVASCULAR STENT GRAFT N/A 07/12/2023   Procedure: ABDOMINAL AORTIC ENDOVASCULAR STENT GRAFT;  Surgeon: Margherita Shell, MD;  Location: [facility] OR;  Service: Vascular;   Laterality: N/A;   ABDOMINAL SURGERY     SHOULDER SURGERY Left    SHOULDER SURGERY     SVT ABLATION N/A 02/09/2018   Procedure: SVT ABLATION;  Surgeon: Tammie Fall, MD;  Location: [facility] INVASIVE [facility];  Service: Cardiovascular;  Laterality: N/A;    Current Outpatient Medications  Medication Sig Dispense Refill   acetaminophen  (TYLENOL ) 325 MG tablet Take 650 mg by mouth every 6 (six) hours as needed for moderate pain (pain score 4-6).     amLODipine  (NORVASC ) 10 MG tablet Take 10 mg by mouth daily.     aspirin  EC 81 MG tablet Take 1 tablet (81 mg total) by mouth daily at 6 (six) AM. Swallow whole. 30 tablet 12   atorvastatin  (LIPITOR) 80 MG tablet Take 1 tablet (80 mg total) by mouth daily. 30 tablet 11   carvedilol  (COREG ) 3.125 MG tablet Take 1 tablet (3.125 mg total) by mouth 2 (two) times daily. 540 tablet 0   lidocaine  (SALONPAS PAIN RELIEVING) 4 % Place 1 patch onto the skin every 12 (twelve) hours. 15 patch 0   losartan  (COZAAR ) 25 MG tablet Take 1 tablet (25 mg total) by mouth daily. 365 tablet 0   metFORMIN (GLUCOPHAGE-XR) 500 MG 24 hr tablet Take 500 mg by mouth 2 (two) times daily.     naproxen  (NAPROSYN ) 500 MG tablet Take 1 tablet (500 mg total) by mouth 2 (two) times daily. 30 tablet 0   methocarbamol  (ROBAXIN ) 500 MG tablet Take 1 tablet (500 mg total) by mouth every 8 (eight) hours  as needed for muscle spasms. (Patient not taking: Reported on 12/27/2023) 30 tablet 0   No current facility-administered medications for this visit.    Allergies as of 12/27/2023   (No Known Allergies)    Family History  Problem Relation Age of Onset   Hypertension Mother    Heart disease Mother     Social History   Socioeconomic History   Marital status: Married    Spouse name: Not on file   Number of children: Not on file   Years of education: Not on file   Highest education level: Not on file  Occupational History   Not on file  Tobacco Use   Smoking status: Former     Current packs/day: 0.00    Types: Cigarettes    Quit date: 08/29/1993    Years since quitting: 30.3   Smokeless tobacco: Never  Vaping Use   Vaping status: Never Used  Substance and Sexual Activity   Alcohol use: Yes    Comment: occasionally   Drug use: Not Currently   Sexual activity: Not on file  Other Topics Concern   Not on file  Social History Narrative   Not on file   Social Drivers of Health   Financial Resource Strain: Not on file  Food Insecurity: No Food Insecurity (06/19/2023)   Hunger Vital Sign    Worried About Running Out of Food in the Last Year: Never true    Ran Out of Food in the Last Year: Never true  Transportation Needs: No Transportation Needs (06/19/2023)   PRAPARE - Administrator, Civil Service (Medical): No    Lack of Transportation (Non-Medical): No  Physical Activity: Not on file  Stress: Not on file  Social Connections: Not on file  Intimate Partner Violence: Not At Risk (06/19/2023)   Humiliation, Afraid, Rape, and Kick questionnaire    Fear of Current or Ex-Partner: No    Emotionally Abused: No    Physically Abused: No    Sexually Abused: No    Subjective: Review of Systems  Constitutional:  Negative for chills and fever.  HENT:  Negative for congestion and hearing loss.   Eyes:  Negative for blurred vision and double vision.  Respiratory:  Negative for cough and shortness of breath.   Cardiovascular:  Negative for chest pain and palpitations.  Gastrointestinal:  Negative for abdominal pain, blood in stool, constipation, diarrhea, heartburn, melena and vomiting.  Genitourinary:  Negative for dysuria and urgency.  Musculoskeletal:  Negative for joint pain and myalgias.  Skin:  Negative for itching and rash.  Neurological:  Negative for dizziness and headaches.  Psychiatric/Behavioral:  Negative for depression. The patient is not nervous/anxious.        Objective: BP 134/86 (BP Location: Left Arm, Patient Position:  Sitting, Cuff Size: Normal)   Pulse 78   Temp (!) 97.5 F (36.4 C) (Temporal)   Ht 5\' 6"  (1.676 m)   Wt 224 lb 14.4 oz (102 kg)   BMI 36.30 kg/m  Physical Exam Constitutional:      Appearance: Normal appearance.  HENT:     Head: Normocephalic and atraumatic.  Eyes:     Extraocular Movements: Extraocular movements intact.     Conjunctiva/sclera: Conjunctivae normal.  Cardiovascular:     Rate and Rhythm: Normal rate and regular rhythm.  Pulmonary:     Effort: Pulmonary effort is normal.     Breath sounds: Normal breath sounds.  Abdominal:     General: Bowel sounds are  normal.     Palpations: Abdomen is soft.  Musculoskeletal:        General: Normal range of motion.     Cervical back: Normal range of motion and neck supple.  Skin:    General: Skin is warm.  Neurological:     General: No focal deficit present.     Mental Status: He is alert and oriented to person, place, and time.  Psychiatric:        Mood and Affect: Mood normal.        Behavior: Behavior normal.      Assessment/Plan:  1.  Abnormal LFTs, hepatic steatosis-will request ultrasound from [facility] today.  Risk factors for MASH include obesity (BMI 36), diabetes, hypertension, dyslipidemia.  Discussed importance of keeping all of these under well control.   Recommend 1-2# weight loss per week until ideal body weight through exercise & diet. Low fat/cholesterol diet.   Avoid sweets, sodas, fruit juices, sweetened beverages like tea, etc. Gradually increase exercise from 15 min daily up to 1 hr per day 5 days/week. Limit alcohol use.  Will perform further serological workup today including viral hepatitis testing, iron studies, autoimmune studies.  Call with results.  ELF testing ordered.  2.  History of adenomatous colon polyps-colonoscopy recall 2029.  Follow-up in 6 months or sooner if needed.  Thank you Dr. Broadus Canes for the kind referral.    12/27/2023 8:53 AM   Disclaimer: This note was  dictated with voice recognition software. Similar sounding words can inadvertently be transcribed and may not be corrected upon review.

## 2023-12-30 LAB — URINALYSIS
Bilirubin, UA: NEGATIVE
Glucose, UA: NEGATIVE
Ketones, UA: NEGATIVE
Nitrite, UA: POSITIVE — AB
RBC, UA: NEGATIVE
Specific Gravity, UA: 1.019 (ref 1.005–1.030)
Urobilinogen, Ur: 0.2 mg/dL (ref 0.2–1.0)
pH, UA: 6 (ref 5.0–7.5)

## 2023-12-30 LAB — HEPATITIS B SURFACE ANTIBODY,QUALITATIVE: Hep B Surface Ab, Qual: NONREACTIVE

## 2023-12-30 LAB — HEPATITIS A ANTIBODY, TOTAL: hep A Total Ab: POSITIVE — AB

## 2023-12-30 LAB — HEPATITIS B SURFACE ANTIGEN: Hepatitis B Surface Ag: NEGATIVE

## 2023-12-30 LAB — IMMUNOGLOBULINS A/E/G/M, SERUM
IgA/Immunoglobulin A, Serum: 448 mg/dL — ABNORMAL HIGH (ref 61–437)
IgE (Immunoglobulin E), Serum: 105 [IU]/mL (ref 6–495)
IgG (Immunoglobin G), Serum: 1871 mg/dL — ABNORMAL HIGH (ref 603–1613)
IgM (Immunoglobulin M), Srm: 99 mg/dL (ref 20–172)

## 2023-12-30 LAB — HEPATITIS C ANTIBODY: Hep C Virus Ab: NONREACTIVE

## 2023-12-30 LAB — ANTI-SMOOTH MUSCLE ANTIBODY, IGG: Smooth Muscle Ab: 6 U (ref 0–19)

## 2023-12-30 LAB — ENHANCED LIVER FIBROSIS (ELF): ELF(TM) Score: 10.27 — ABNORMAL HIGH (ref <9.80)

## 2023-12-30 LAB — HEPATITIS B CORE ANTIBODY, TOTAL: Hep B Core Total Ab: NEGATIVE

## 2023-12-30 LAB — ANA: Anti Nuclear Antibody (ANA): NEGATIVE

## 2024-01-01 ENCOUNTER — Encounter: Payer: Self-pay | Admitting: Surgery

## 2024-01-01 ENCOUNTER — Ambulatory Visit: Payer: 59 | Attending: Surgery | Admitting: Surgery

## 2024-01-01 VITALS — BP 117/79 | HR 67 | Temp 97.8°F | Ht 66.0 in | Wt 223.0 lb

## 2024-01-01 DIAGNOSIS — I7143 Infrarenal abdominal aortic aneurysm, without rupture: Secondary | ICD-10-CM | POA: Diagnosis not present

## 2024-01-01 NOTE — Progress Notes (Signed)
 [facility] [place]  Patient name: Jason Sweeney MRN: [id number] DOB: 1955/09/19 Sex: male   REASON FOR VISIT:    Follow-up  HISOTRY OF PRESENT ILLNESS:    Jason Sweeney is a 68 y.o. male who is status post endovascular repair of a 5.5 cm infrarenal abdominal aortic aneurysm on 07/12/2023.  His postoperative course was uneventful.  [place] was [place] in [place] [facility] and was traveling to [place] for 5 months.  Postoperative duplex showed good position of the stent graft without endoleak.  He is back today for follow-up.  He has no complaints.  The patient is medically managed for hypertension which has been under good control. He is a former smoker having quit over 25 years ago. He does have some dyspnea on exertion.   PAST MEDICAL HISTORY:   Past Medical History:  Diagnosis Date   AAA (abdominal aortic aneurysm) (HCC)    AKI (acute kidney injury) (HCC) 12/14/2017   Chronic neck pain    Diabetes mellitus without complication (HCC)    History of kidney stones    HTN (hypertension) 12/14/2017   SVT (supraventricular tachycardia) (HCC) 12/14/2017     FAMILY HISTORY:   Family History  Problem Relation Age of Onset   Hypertension Mother    Heart disease Mother     SOCIAL HISTORY:   Social History   Tobacco Use   Smoking status: Former    Current packs/day: 0.00    Types: Cigarettes    Quit date: 08/29/1993    Years since quitting: 30.3   Smokeless tobacco: Never  Substance Use Topics   Alcohol use: Yes    Comment: occasionally     ALLERGIES:   No Known Allergies   CURRENT MEDICATIONS:   Current Outpatient Medications  Medication Sig Dispense Refill   acetaminophen  (TYLENOL ) 325 MG tablet Take 650 mg by mouth every 6 (six) hours as needed for moderate pain (pain score 4-6).     amLODipine  (NORVASC ) 10 MG tablet Take 10 mg by mouth daily.     aspirin  EC 81 MG tablet Take 1 tablet (81 mg total) by  mouth daily at 6 (six) AM. Swallow whole. 30 tablet 12   atorvastatin  (LIPITOR) 80 MG tablet Take 1 tablet (80 mg total) by mouth daily. 30 tablet 11   carvedilol  (COREG ) 3.125 MG tablet Take 1 tablet (3.125 mg total) by mouth 2 (two) times daily. 540 tablet 0   lidocaine  (SALONPAS PAIN RELIEVING) 4 % Place 1 patch onto the skin every 12 (twelve) hours. 15 patch 0   losartan  (COZAAR ) 25 MG tablet Take 1 tablet (25 mg total) by mouth daily. 365 tablet 0   metFORMIN (GLUCOPHAGE-XR) 500 MG 24 hr tablet Take 500 mg by mouth 2 (two) times daily.     naproxen  (NAPROSYN ) 500 MG tablet Take 1 tablet (500 mg total) by mouth 2 (two) times daily. 30 tablet 0   methocarbamol  (ROBAXIN ) 500 MG tablet Take 1 tablet (500 mg total) by mouth every 8 (eight) hours as needed for muscle spasms. (Patient not taking: Reported on 01/01/2024) 30 tablet 0   No current facility-administered medications for this visit.    REVIEW OF SYSTEMS:   [X]  denotes positive finding, [ ]  denotes negative finding Cardiac  Comments:  Chest pain or chest pressure:    Shortness of breath upon exertion:    Short of breath when lying flat:    Irregular heart rhythm:        Vascular  Pain in calf, thigh, or hip brought on by ambulation:    Pain in feet at night that wakes you up from your sleep:     Blood clot in your veins:    Leg swelling:         Pulmonary    Oxygen at home:    Productive cough:     Wheezing:         Neurologic    Sudden weakness in arms or legs:     Sudden numbness in arms or legs:     Sudden onset of difficulty speaking or slurred speech:    Temporary loss of vision in one eye:     Problems with dizziness:         Gastrointestinal    Blood in stool:     Vomited blood:         Genitourinary    Burning when urinating:     Blood in urine:        Psychiatric    Major depression:         Hematologic    Bleeding problems:    Problems with blood clotting too easily:        Skin    Rashes or  ulcers:        Constitutional    Fever or chills:      PHYSICAL EXAM:   Vitals:   01/01/24 0836  BP: 117/79  Pulse: 67  Temp: 97.8 F (36.6 C)  SpO2: 94%  Weight: 223 lb (101.2 kg)  Height: 5\' 6"  (1.676 m)    GENERAL: The patient is a well-nourished male, in no acute distress. The vital signs are documented above. CARDIAC: There is a regular rate and rhythm. PULMONARY: Non-labored respirations ABDOMEN: Soft and non-tender MUSCULOSKELETAL: There are no major deformities or cyanosis. NEUROLOGIC: No focal weakness or paresthesias are detected. SKIN: There are no ulcers or rashes noted. PSYCHIATRIC: The patient has a normal affect.  STUDIES:   I have reviewed the following CTA:  1. Interval placement of a bifurcated endovascular stent graft within the infrarenal abdominal aorta without evidence of complication. The new baseline aortic measurement for the current exam is 5.5 x 5.2 cm. MEDICAL ISSUES:   AAA: No residual because CT scan.  Aneurysm measures 5.5 cm which is stable.  He will return in 6 months for a ultrasound.  If this also remains stable we will go to annual surveillance  Carotid: He did not have a preoperative carotid duplex.  I will get this when he returns.    Marti Slates, MD, FACS [facility] [phone] Pager [phone]

## 2024-01-04 ENCOUNTER — Other Ambulatory Visit: Payer: Self-pay | Admitting: *Deleted

## 2024-01-04 DIAGNOSIS — R0989 Other specified symptoms and signs involving the circulatory and respiratory systems: Secondary | ICD-10-CM

## 2024-01-04 DIAGNOSIS — Z9889 Other specified postprocedural states: Secondary | ICD-10-CM

## 2024-01-11 DIAGNOSIS — Z0001 Encounter for general adult medical examination with abnormal findings: Secondary | ICD-10-CM | POA: Diagnosis not present

## 2024-01-11 DIAGNOSIS — E1165 Type 2 diabetes mellitus with hyperglycemia: Secondary | ICD-10-CM | POA: Diagnosis not present

## 2024-01-11 DIAGNOSIS — E7800 Pure hypercholesterolemia, unspecified: Secondary | ICD-10-CM | POA: Diagnosis not present

## 2024-01-11 DIAGNOSIS — I10 Essential (primary) hypertension: Secondary | ICD-10-CM | POA: Diagnosis not present

## 2024-01-11 DIAGNOSIS — Z23 Encounter for immunization: Secondary | ICD-10-CM | POA: Diagnosis not present

## 2024-01-11 DIAGNOSIS — I714 Abdominal aortic aneurysm, without rupture, unspecified: Secondary | ICD-10-CM | POA: Diagnosis not present

## 2024-01-11 DIAGNOSIS — R300 Dysuria: Secondary | ICD-10-CM | POA: Diagnosis not present

## 2024-01-12 DIAGNOSIS — Z0000 Encounter for general adult medical examination without abnormal findings: Secondary | ICD-10-CM | POA: Diagnosis not present

## 2024-01-12 DIAGNOSIS — Z0001 Encounter for general adult medical examination with abnormal findings: Secondary | ICD-10-CM | POA: Diagnosis not present

## 2024-01-12 DIAGNOSIS — Z1389 Encounter for screening for other disorder: Secondary | ICD-10-CM | POA: Diagnosis not present

## 2024-02-01 DIAGNOSIS — R300 Dysuria: Secondary | ICD-10-CM | POA: Diagnosis not present

## 2024-02-01 DIAGNOSIS — E1165 Type 2 diabetes mellitus with hyperglycemia: Secondary | ICD-10-CM | POA: Diagnosis not present

## 2024-02-01 DIAGNOSIS — E7800 Pure hypercholesterolemia, unspecified: Secondary | ICD-10-CM | POA: Diagnosis not present

## 2024-02-01 DIAGNOSIS — I714 Abdominal aortic aneurysm, without rupture, unspecified: Secondary | ICD-10-CM | POA: Diagnosis not present

## 2024-02-01 DIAGNOSIS — D369 Benign neoplasm, unspecified site: Secondary | ICD-10-CM | POA: Diagnosis not present

## 2024-05-14 ENCOUNTER — Encounter: Payer: Self-pay | Admitting: Internal Medicine

## 2024-05-28 DIAGNOSIS — I10 Essential (primary) hypertension: Secondary | ICD-10-CM | POA: Diagnosis not present

## 2024-05-28 DIAGNOSIS — D649 Anemia, unspecified: Secondary | ICD-10-CM | POA: Diagnosis not present

## 2024-05-28 DIAGNOSIS — E1169 Type 2 diabetes mellitus with other specified complication: Secondary | ICD-10-CM | POA: Diagnosis not present

## 2024-05-28 DIAGNOSIS — E559 Vitamin D deficiency, unspecified: Secondary | ICD-10-CM | POA: Diagnosis not present

## 2024-05-28 DIAGNOSIS — R739 Hyperglycemia, unspecified: Secondary | ICD-10-CM | POA: Diagnosis not present

## 2024-05-28 DIAGNOSIS — Z1329 Encounter for screening for other suspected endocrine disorder: Secondary | ICD-10-CM | POA: Diagnosis not present

## 2024-06-21 DIAGNOSIS — D369 Benign neoplasm, unspecified site: Secondary | ICD-10-CM | POA: Diagnosis not present

## 2024-06-21 DIAGNOSIS — K760 Fatty (change of) liver, not elsewhere classified: Secondary | ICD-10-CM | POA: Diagnosis not present

## 2024-06-21 DIAGNOSIS — E1165 Type 2 diabetes mellitus with hyperglycemia: Secondary | ICD-10-CM | POA: Diagnosis not present

## 2024-06-21 DIAGNOSIS — I10 Essential (primary) hypertension: Secondary | ICD-10-CM | POA: Diagnosis not present

## 2024-06-21 DIAGNOSIS — I714 Abdominal aortic aneurysm, without rupture, unspecified: Secondary | ICD-10-CM | POA: Diagnosis not present

## 2024-06-21 DIAGNOSIS — R42 Dizziness and giddiness: Secondary | ICD-10-CM | POA: Diagnosis not present

## 2024-06-21 DIAGNOSIS — Z0001 Encounter for general adult medical examination with abnormal findings: Secondary | ICD-10-CM | POA: Diagnosis not present

## 2024-06-21 DIAGNOSIS — Z23 Encounter for immunization: Secondary | ICD-10-CM | POA: Diagnosis not present

## 2024-06-21 DIAGNOSIS — R300 Dysuria: Secondary | ICD-10-CM | POA: Diagnosis not present

## 2024-06-21 DIAGNOSIS — Z8679 Personal history of other diseases of the circulatory system: Secondary | ICD-10-CM | POA: Diagnosis not present

## 2024-07-01 ENCOUNTER — Encounter: Payer: Self-pay | Admitting: Surgery

## 2024-07-01 ENCOUNTER — Ambulatory Visit (HOSPITAL_BASED_OUTPATIENT_CLINIC_OR_DEPARTMENT_OTHER)
Admission: RE | Admit: 2024-07-01 | Discharge: 2024-07-01 | Disposition: A | Source: Ambulatory Visit | Attending: Surgery | Admitting: Surgery

## 2024-07-01 ENCOUNTER — Ambulatory Visit: Attending: Surgery | Admitting: Surgery

## 2024-07-01 ENCOUNTER — Ambulatory Visit (HOSPITAL_COMMUNITY)
Admission: RE | Admit: 2024-07-01 | Discharge: 2024-07-01 | Disposition: A | Discharge status: Home / Self Care | Source: Ambulatory Visit | Attending: Surgery | Admitting: Surgery

## 2024-07-01 VITALS — BP 142/92 | HR 73 | Temp 98.2°F | Ht 66.0 in | Wt 213.0 lb

## 2024-07-01 DIAGNOSIS — I7143 Infrarenal abdominal aortic aneurysm, without rupture: Secondary | ICD-10-CM

## 2024-07-01 DIAGNOSIS — Z9889 Other specified postprocedural states: Secondary | ICD-10-CM | POA: Insufficient documentation

## 2024-07-01 DIAGNOSIS — R0989 Other specified symptoms and signs involving the circulatory and respiratory systems: Secondary | ICD-10-CM | POA: Insufficient documentation

## 2024-07-01 NOTE — Progress Notes (Signed)
 [facility] [place]  Patient name: Jason Sweeney MRN: [id number] DOB: Mar 15, 1956 Sex: male   REASON FOR VISIT:    Follow up  HISOTRY OF PRESENT ILLNESS:    Jason Sweeney is a 68 y.o. male who is status post endovascular repair of a 5.5 cm infrarenal abdominal aortic aneurysm on 07/12/2023.  His postoperative course was uneventful.   Postoperative duplex showed good position of the stent graft without endoleak.  He is back today for follow-up.  He has no complaints.  He is getting ready to go back to [place] in the next few weeks   The patient is medically managed for hypertension which has been under good control. He is a former smoker having quit over 25 years ago. He does have some dyspnea on exertion.    PAST MEDICAL HISTORY:   Past Medical History:  Diagnosis Date   AAA (abdominal aortic aneurysm)    AKI (acute kidney injury) 12/14/2017   Chronic neck pain    Diabetes mellitus without complication (HCC)    History of kidney stones    HTN (hypertension) 12/14/2017   SVT (supraventricular tachycardia) 12/14/2017     FAMILY HISTORY:   Family History  Problem Relation Age of Onset   Hypertension Mother    Heart disease Mother     SOCIAL HISTORY:   Social History   Tobacco Use   Smoking status: Former    Current packs/day: 0.00    Types: Cigarettes    Quit date: 08/29/1993    Years since quitting: 30.8   Smokeless tobacco: Never  Substance Use Topics   Alcohol use: Yes    Comment: occasionally     ALLERGIES:   No Known Allergies   CURRENT MEDICATIONS:   Current Outpatient Medications  Medication Sig Dispense Refill   acetaminophen  (TYLENOL ) 325 MG tablet Take 650 mg by mouth every 6 (six) hours as needed for moderate pain (pain score 4-6).     amLODipine  (NORVASC ) 10 MG tablet Take 10 mg by mouth daily.     aspirin  EC 81 MG tablet Take 1 tablet (81 mg total) by mouth daily at 6 (six) AM. Swallow  whole. 30 tablet 12   atorvastatin  (LIPITOR) 80 MG tablet Take 1 tablet (80 mg total) by mouth daily. 30 tablet 11   carvedilol  (COREG ) 3.125 MG tablet Take 1 tablet (3.125 mg total) by mouth 2 (two) times daily. 540 tablet 0   lidocaine  (SALONPAS PAIN RELIEVING) 4 % Place 1 patch onto the skin every 12 (twelve) hours. 15 patch 0   losartan  (COZAAR ) 25 MG tablet Take 1 tablet (25 mg total) by mouth daily. 365 tablet 0   metFORMIN (GLUCOPHAGE-XR) 500 MG 24 hr tablet Take 500 mg by mouth 2 (two) times daily.     methocarbamol  (ROBAXIN ) 500 MG tablet Take 1 tablet (500 mg total) by mouth every 8 (eight) hours as needed for muscle spasms. 30 tablet 0   naproxen  (NAPROSYN ) 500 MG tablet Take 1 tablet (500 mg total) by mouth 2 (two) times daily. 30 tablet 0   No current facility-administered medications for this visit.    REVIEW OF SYSTEMS:   [X]  denotes positive finding, [ ]  denotes negative finding Cardiac  Comments:  Chest pain or chest pressure:    Shortness of breath upon exertion:    Short of breath when lying flat:    Irregular heart rhythm:        Vascular    Pain in calf, thigh,  or hip brought on by ambulation:    Pain in feet at night that wakes you up from your sleep:     Blood clot in your veins:    Leg swelling:         Pulmonary    Oxygen at home:    Productive cough:     Wheezing:         Neurologic    Sudden weakness in arms or legs:     Sudden numbness in arms or legs:     Sudden onset of difficulty speaking or slurred speech:    Temporary loss of vision in one eye:     Problems with dizziness:         Gastrointestinal    Blood in stool:     Vomited blood:         Genitourinary    Burning when urinating:     Blood in urine:        Psychiatric    Major depression:         Hematologic    Bleeding problems:    Problems with blood clotting too easily:        Skin    Rashes or ulcers:        Constitutional    Fever or chills:      PHYSICAL EXAM:    Vitals:   07/01/24 0853 07/01/24 0855  BP: (!) 152/96 (!) 142/92  Pulse: 73   Temp: 98.2 F (36.8 C)   SpO2: 93%   Weight: 213 lb (96.6 kg)   Height: 5' 6 (1.676 m)     GENERAL: The patient is a well-nourished male, in no acute distress. The vital signs are documented above. CARDIAC: There is a regular rate and rhythm.  VASCULAR: Palpable pedal pulses PULMONARY: Non-labored respirations ABDOMEN: Soft and non-tender  MUSCULOSKELETAL: There are no major deformities or cyanosis. NEUROLOGIC: No focal weakness or paresthesias are detected. SKIN: There are no ulcers or rashes noted. PSYCHIATRIC: The patient has a normal affect.  STUDIES:   I have reviewed the following: Carotid: Right Carotid: Velocities in the right ICA are consistent with a 1-39%  stenosis.   Left Carotid: Velocities in the left ICA are consistent with a 1-39%  stenosis.   Vertebrals: Bilateral vertebral arteries demonstrate antegrade flow.  Subclavians: Normal flow hemodynamics were seen in bilateral subclavian               arteries.    AAA: Endovascular Aortic Repair (EVAR):  +----------+----------------+-------------------+-------------------+           Diameter AP (cm)Diameter Trans (cm)Velocities (cm/sec)  +----------+----------------+-------------------+-------------------+  Aorta    5.45            5.37               91                   +----------+----------------+-------------------+-------------------+  Right Limb1.34            1.39               69                   +----------+----------------+-------------------+-------------------+  Left Limb 1.13            1.34               76                   +----------+----------------+-------------------+-------------------+  Summary:  Abdominal Aorta: Patent endovascular aneurysm repair with no evidence of  endoleak.   MEDICAL ISSUES:   AAA: Slight decrease in maximum aortic diameter down to 5.45 from 5.5.  No  evidence of endoleak.  I will reimage him in 1 year  He will get lower extremity arterial Doppler studies to evaluate for popliteal aneurysm at his next visit  Carotid duplex did not show any evidence of carotid stenosis    Malvina Serene CLORE, MD, FACS [facility] [phone] Pager [phone]

## 2024-09-20 NOTE — Progress Notes (Signed)
 Kaylon Hitz                                          MRN: [id number]   09/20/2024   The [facility] Quality Team Specialist reviewed this patient medical record for the purposes of chart review for care gap closure. The following were reviewed: chart review for care gap closure-glycemic status assessment.    [facility] Quality Team
